# Patient Record
Sex: Female | Born: 1940
Health system: Southern US, Community
[De-identification: ages and names within clinical notes are randomized; demographics above are authoritative.]

## PROBLEM LIST (undated history)

## (undated) DIAGNOSIS — E559 Vitamin D deficiency, unspecified: Secondary | ICD-10-CM

## (undated) DIAGNOSIS — M1990 Unspecified osteoarthritis, unspecified site: Secondary | ICD-10-CM

## (undated) DIAGNOSIS — M4800 Spinal stenosis, site unspecified: Secondary | ICD-10-CM

## (undated) DIAGNOSIS — E785 Hyperlipidemia, unspecified: Secondary | ICD-10-CM

## (undated) DIAGNOSIS — I10 Essential (primary) hypertension: Secondary | ICD-10-CM

## (undated) DIAGNOSIS — R200 Anesthesia of skin: Secondary | ICD-10-CM

## (undated) DIAGNOSIS — H8110 Benign paroxysmal vertigo, unspecified ear: Secondary | ICD-10-CM

## (undated) DIAGNOSIS — I2510 Atherosclerotic heart disease of native coronary artery without angina pectoris: Secondary | ICD-10-CM

## (undated) DIAGNOSIS — R112 Nausea with vomiting, unspecified: Secondary | ICD-10-CM

## (undated) DIAGNOSIS — R0989 Other specified symptoms and signs involving the circulatory and respiratory systems: Secondary | ICD-10-CM

## (undated) DIAGNOSIS — Z9889 Other specified postprocedural states: Secondary | ICD-10-CM

## (undated) DIAGNOSIS — F419 Anxiety disorder, unspecified: Secondary | ICD-10-CM

## (undated) DIAGNOSIS — J189 Pneumonia, unspecified organism: Secondary | ICD-10-CM

## (undated) HISTORY — DX: Unspecified osteoarthritis, unspecified site: M19.90

## (undated) HISTORY — DX: Benign paroxysmal vertigo, unspecified ear: H81.10

## (undated) HISTORY — DX: Anesthesia of skin: R20.0

## (undated) HISTORY — DX: Hyperlipidemia, unspecified: E78.5

## (undated) HISTORY — PX: TONSILLECTOMY: SUR1361

## (undated) HISTORY — PX: WISDOM TOOTH EXTRACTION: SHX21

## (undated) HISTORY — DX: Vitamin D deficiency, unspecified: E55.9

## (undated) HISTORY — PX: CATARACT EXTRACTION, BILATERAL: SHX1313

## (undated) HISTORY — PX: ABDOMINAL HYSTERECTOMY: SHX81

## (undated) HISTORY — DX: Essential (primary) hypertension: I10

## (undated) HISTORY — DX: Other specified symptoms and signs involving the circulatory and respiratory systems: R09.89

## (undated) HISTORY — DX: Spinal stenosis, site unspecified: M48.00

---
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
## 1979-02-02 HISTORY — PX: ABDOMINAL HYSTERECTOMY: SUR658

## 2018-02-01 HISTORY — PX: CATARACT EXTRACTION, BILATERAL: SHX1313

## 2018-06-02 HISTORY — PX: COLONOSCOPY: SHX5424

## 2018-11-02 HISTORY — PX: CATARACT EXTRACTION, BILATERAL: SHX1313

## 2019-06-28 ENCOUNTER — Other Ambulatory Visit: Payer: Self-pay | Admitting: *Deleted

## 2019-06-28 ENCOUNTER — Encounter: Payer: Self-pay | Admitting: *Deleted

## 2019-07-04 ENCOUNTER — Ambulatory Visit: Payer: Medicare PPO | Admitting: Diagnostic Neuroimaging

## 2019-07-04 ENCOUNTER — Other Ambulatory Visit: Payer: Self-pay

## 2019-07-04 ENCOUNTER — Encounter: Payer: Self-pay | Admitting: Diagnostic Neuroimaging

## 2019-07-04 VITALS — BP 124/70 | HR 55 | Ht 65.0 in | Wt 140.0 lb

## 2019-07-04 DIAGNOSIS — M4802 Spinal stenosis, cervical region: Secondary | ICD-10-CM | POA: Diagnosis not present

## 2019-07-04 MED ORDER — ALPRAZOLAM 0.5 MG PO TABS: ORAL_TABLET | ORAL | 0 refills | Status: DC

## 2019-07-04 NOTE — Progress Notes (Signed)
[facility]  PATIENT: Haley Robinson DOB: Oct 08, 1940  REFERRING CLINICIAN: Raelene Bott, MD HISTORY FROM: patient  REASON FOR VISIT: new consult    HISTORICAL  CHIEF COMPLAINT:  Chief Complaint  Patient presents with  . Numbness, tingling of hands, weakness    rm 6 New Pt "numbness, tingling for years mostly in left hand, no pain; I am left handed, can't grasp or make a fist on left; saw Dr Joya Salm years ago- dx with spinal stenosis"    HISTORY OF PRESENT ILLNESS:   79 year old female with history of cervical spinal stenosis and numbness in hands since the year 2000, here for follow-up.  Patient has chronic symptoms of numbness in hands, weakness in hands, previously evaluated by neurology and neurosurgery around the year 2000.  She was diagnosed with cervical spinal stenosis and managed conservatively.  Symptoms have progressed over time.  In last 1 months she has noticed more stiffness, arthritis, weakness in her hand grip.  Also has numbness and tingling in her toes which is chronic.  Symptoms worse in the left and right side.   REVIEW OF SYSTEMS: Full 14 system review of systems performed and negative with exception of: As per HPI.  ALLERGIES: No Known Allergies  HOME MEDICATIONS: Outpatient Medications Prior to Visit  Medication Sig Dispense Refill  . aspirin 81 MG EC tablet Take 81 mg by mouth daily.    . hydrochlorothiazide (HYDRODIURIL) 25 MG tablet TAKE 1 TABLET BY MOUTH EVERY DAY    . Multiple Vitamin (MULTI-VITAMIN) tablet Take by mouth daily.    . simvastatin (ZOCOR) 40 MG tablet TAKE 1 TABLET BY MOUTH DAILY    . meclizine (ANTIVERT) 12.5 MG tablet Take one to two tablets three times daily as needed     No facility-administered medications prior to visit.    PAST MEDICAL HISTORY: Past Medical History:  Diagnosis Date  . CAD (coronary artery disease)   . Carotid bruit   . Hyperlipidemia   . Hypertension   . Numbness and tingling in  both hands   . Osteoarthritis    hands  . Spinal stenosis   . Vertigo, benign positional    x 4 years  . Vitamin D deficiency     PAST SURGICAL HISTORY: Past Surgical History:  Procedure Laterality Date  . ABDOMINAL HYSTERECTOMY  1981  . CATARACT EXTRACTION, BILATERAL  2020    FAMILY HISTORY: Family History  Problem Relation Age of Onset  . Alzheimer's disease Mother   . Lung cancer Father     SOCIAL HISTORY: Social History   Socioeconomic History  . Marital status: Widowed    Spouse name: Not on file  . Number of children: 2  . Years of education: 97  . Highest education level: Not on file  Occupational History    Comment: retired Art therapist, clerical work  Tobacco Use  . Smoking status: Current Every Day Smoker    Packs/day: 0.25  . Smokeless tobacco: Never Used  Substance and Sexual Activity  . Alcohol use: Never  . Drug use: Never  . Sexual activity: Not on file  Other Topics Concern  . Not on file  Social History Narrative   06/28/19 Daughter lives with her   Caffeine- coffee 1 c daily   Social Determinants of Health   Financial Resource Strain:   . Difficulty of Paying Living Expenses:   Food Insecurity:   . Worried About Charity fundraiser in the Last Year:   .  Ran Out of Food in the Last Year:   Transportation Needs:   . Film/video editor (Medical):   Marland Kitchen Lack of Transportation (Non-Medical):   Physical Activity:   . Days of Exercise per Week:   . Minutes of Exercise per Session:   Stress:   . Feeling of Stress :   Social Connections:   . Frequency of Communication with Friends and Family:   . Frequency of Social Gatherings with Friends and Family:   . Attends Religious Services:   . Active Member of Clubs or Organizations:   . Attends Archivist Meetings:   Marland Kitchen Marital Status:   Intimate Partner Violence:   . Fear of Current or Ex-Partner:   . Emotionally Abused:   Marland Kitchen Physically Abused:   . Sexually Abused:       PHYSICAL EXAM  GENERAL EXAM/CONSTITUTIONAL: Vitals:  Vitals:   07/04/19 0844  BP: 124/70  Pulse: (!) 55  Weight: 140 lb (63.5 kg)  Height: _0  (1.651 m)     Body mass index is 23.3 kg/m. Wt Readings from Last 3 Encounters:  07/04/19 140 lb (63.5 kg)     Patient is in no distress; well developed, nourished and groomed; neck is supple  CARDIOVASCULAR:  Examination of carotid arteries is normal; no carotid bruits  Regular rate and rhythm, no murmurs  Examination of peripheral vascular system by observation and palpation is normal  EYES:  Ophthalmoscopic exam of optic discs and posterior segments is normal; no papilledema or hemorrhages  No exam data present  MUSCULOSKELETAL:  Gait, strength, tone, movements noted in Neurologic exam below  NEUROLOGIC: MENTAL STATUS:  No flowsheet data found.  awake, alert, oriented to person, place and time  recent and remote memory intact  normal attention and concentration  language fluent, comprehension intact, naming intact  fund of knowledge appropriate  CRANIAL NERVE:   2nd - no papilledema on fundoscopic exam  2nd, 3rd, 4th, 6th - pupils equal and reactive to light, visual fields full to confrontation, extraocular muscles intact, no nystagmus  5th - facial sensation symmetric  7th - facial strength symmetric  8th - hearing intact  9th - palate elevates symmetrically, uvula midline  11th - shoulder shrug symmetric  12th - tongue protrusion midline  MOTOR:   normal bulk and tone, full strength in the BUE, BLE; EXCEPT ATROPHY AND WEAKNESS OF BILATERAL HAND INTRINSIC MUSCLES AND LEFT HIP FLEXION (LIMITED BY PAIN)  SIGNIFICANT ARTHRITIS IN BILATERAL HANDS  SENSORY:   normal and symmetric to light touch, pinprick, temperature, vibration; EXCEPT DECR IN FINGERS AND TOES  COORDINATION:   finger-nose-finger, fine finger movements normal  REFLEXES:   deep tendon reflexes ---> BRISK IN UPPER  EXT; TRACE IN LEGS  GAIT/STATION:   narrow based gait     DIAGNOSTIC DATA (LABS, IMAGING, TESTING) - I reviewed patient records, labs, notes, testing and imaging myself where available.  No results found for: WBC, HGB, HCT, MCV, PLT No results found for: NA, K, CL, CO2, GLUCOSE, BUN, CREATININE, CALCIUM, PROT, ALBUMIN, AST, ALT, ALKPHOS, BILITOT, GFRNONAA, GFRAA No results found for: CHOL, HDL, LDLCALC, LDLDIRECT, TRIG, CHOLHDL No results found for: HGBA1C No results found for: VITAMINB12 No results found for: TSH    ASSESSMENT AND PLAN  79 y.o. year old female here with:  Dx:  1. Cervical spinal stenosis     PLAN:  HAND / FOOT NUMBNESS - check MRI cervical spine (history cervical spinal stenosis since ~2000) - check neuropathy labs (  since ~2000)  Meds ordered this encounter  Medications  . ALPRAZolam (XANAX) 0.5 MG tablet    Sig: for sedation before MRI scan; take 1 tab 1 hour before scan; may repeat 1 tab 15 min before scan    Dispense:  3 tablet    Refill:  0   Orders Placed This Encounter  Procedures  . MR CERVICAL SPINE WO CONTRAST  . CBC with diff  . CMP  . Vitamin B12  . A1c  . TSH  . SPEP with IFE  . ANA w/Reflex  . SSA, SSB  . ESR  . CRP  . Vitamin B1  . Vitamin B6  . ANCA   Return pending test results, for pending if symptoms worsen or fail to improve.    Penni Bombard, MD 08/06/8830, 5:49 AM Certified in Neurology, Neurophysiology and Neuroimaging  [facility] [address], [place], [place] [address] [phone]

## 2019-07-09 ENCOUNTER — Telehealth: Payer: Self-pay | Admitting: *Deleted

## 2019-07-09 LAB — VITAMIN B12: Vitamin B-12: 149 pg/mL — ABNORMAL LOW (ref 232–1245)

## 2019-07-09 LAB — MULTIPLE MYELOMA PANEL, SERUM
Albumin SerPl Elph-Mcnc: 3.7 g/dL (ref 2.9–4.4)
Albumin/Glob SerPl: 1.7 (ref 0.7–1.7)
Alpha 1: 0.2 g/dL (ref 0.0–0.4)
Alpha2 Glob SerPl Elph-Mcnc: 0.7 g/dL (ref 0.4–1.0)
B-Globulin SerPl Elph-Mcnc: 0.8 g/dL (ref 0.7–1.3)
Gamma Glob SerPl Elph-Mcnc: 0.6 g/dL (ref 0.4–1.8)
Globulin, Total: 2.2 g/dL (ref 2.2–3.9)
IgA/Immunoglobulin A, Serum: 75 mg/dL (ref 64–422)
IgG (Immunoglobin G), Serum: 573 mg/dL — ABNORMAL LOW (ref 586–1602)
IgM (Immunoglobulin M), Srm: 48 mg/dL (ref 26–217)

## 2019-07-09 LAB — COMPREHENSIVE METABOLIC PANEL
ALT: 10 IU/L (ref 0–32)
AST: 13 IU/L (ref 0–40)
Albumin/Globulin Ratio: 2.3 — ABNORMAL HIGH (ref 1.2–2.2)
Albumin: 4.1 g/dL (ref 3.7–4.7)
Alkaline Phosphatase: 71 IU/L (ref 48–121)
BUN/Creatinine Ratio: 11 — ABNORMAL LOW (ref 12–28)
BUN: 9 mg/dL (ref 8–27)
Bilirubin Total: 0.3 mg/dL (ref 0.0–1.2)
CO2: 26 mmol/L (ref 20–29)
Calcium: 9.4 mg/dL (ref 8.7–10.3)
Chloride: 107 mmol/L — ABNORMAL HIGH (ref 96–106)
Creatinine, Ser: 0.82 mg/dL (ref 0.57–1.00)
GFR calc Af Amer: 80 mL/min/{1.73_m2} (ref >59)
GFR calc non Af Amer: 70 mL/min/{1.73_m2} (ref >59)
Globulin, Total: 1.8 g/dL (ref 1.5–4.5)
Glucose: 96 mg/dL (ref 65–99)
Potassium: 4 mmol/L (ref 3.5–5.2)
Sodium: 143 mmol/L (ref 134–144)
Total Protein: 5.9 g/dL — ABNORMAL LOW (ref 6.0–8.5)

## 2019-07-09 LAB — CBC WITH DIFFERENTIAL/PLATELET
Basophils Absolute: 0.1 10*3/uL (ref 0.0–0.2)
Basos: 1 %
EOS (ABSOLUTE): 0.1 10*3/uL (ref 0.0–0.4)
Eos: 2 %
Hematocrit: 39.3 % (ref 34.0–46.6)
Hemoglobin: 12.9 g/dL (ref 11.1–15.9)
Immature Grans (Abs): 0 10*3/uL (ref 0.0–0.1)
Immature Granulocytes: 0 %
Lymphocytes Absolute: 1.9 10*3/uL (ref 0.7–3.1)
Lymphs: 26 %
MCH: 31.2 pg (ref 26.6–33.0)
MCHC: 32.8 g/dL (ref 31.5–35.7)
MCV: 95 fL (ref 79–97)
Monocytes Absolute: 0.5 10*3/uL (ref 0.1–0.9)
Monocytes: 6 %
Neutrophils Absolute: 4.8 10*3/uL (ref 1.4–7.0)
Neutrophils: 65 %
Platelets: 246 10*3/uL (ref 150–450)
RBC: 4.13 x10E6/uL (ref 3.77–5.28)
RDW: 12.8 % (ref 11.7–15.4)
WBC: 7.4 10*3/uL (ref 3.4–10.8)

## 2019-07-09 LAB — PAN-ANCA
ANCA Proteinase 3: <3.5 U/mL (ref 0.0–3.5)
Atypical pANCA: <1:20 {titer}
C-ANCA: <1:20 {titer}
Myeloperoxidase Ab: <9 U/mL (ref 0.0–9.0)
P-ANCA: <1:20 {titer}

## 2019-07-09 LAB — C-REACTIVE PROTEIN: CRP: 2 mg/L (ref 0–10)

## 2019-07-09 LAB — TSH: TSH: 1.56 u[IU]/mL (ref 0.450–4.500)

## 2019-07-09 LAB — HEMOGLOBIN A1C
Est. average glucose Bld gHb Est-mCnc: 120 mg/dL
Hgb A1c MFr Bld: 5.8 % — ABNORMAL HIGH (ref 4.8–5.6)

## 2019-07-09 LAB — SJOGREN'S SYNDROME ANTIBODS(SSA + SSB)
ENA SSA (RO) Ab: <0.2 AI (ref 0.0–0.9)
ENA SSB (LA) Ab: <0.2 AI (ref 0.0–0.9)

## 2019-07-09 LAB — VITAMIN B1: Thiamine: 136.4 nmol/L (ref 66.5–200.0)

## 2019-07-09 LAB — ANA W/REFLEX: ANA Titer 1: NEGATIVE

## 2019-07-09 LAB — SEDIMENTATION RATE: Sed Rate: 5 mm/hr (ref 0–40)

## 2019-07-09 LAB — VITAMIN B6: Vitamin B6: 6.7 ug/L (ref 2.0–32.8)

## 2019-07-09 NOTE — Telephone Encounter (Signed)
LVM requesting call back for results. 

## 2019-07-09 NOTE — Telephone Encounter (Addendum)
Called patient and informed her labs showed low B12 levels; other labs pending. Dr Marjory Lies recommends a B12 replacement (1000 mcg oral daily) and follow up with PCP for possible B12 injection. Advised she'll get a call when other labs are completed. Patient verbalized understanding, appreciation.

## 2019-07-09 NOTE — Telephone Encounter (Signed)
Pt returned call. Please call back when available. 

## 2019-07-11 ENCOUNTER — Telehealth: Payer: Self-pay | Admitting: Diagnostic Neuroimaging

## 2019-07-11 NOTE — Telephone Encounter (Signed)
Francine Graven Berkley Harvey: [id number] (exp. 07/11/19 to 08/10/19) order sent to GI. They will reach out to the patient to schedule.

## 2019-07-16 ENCOUNTER — Ambulatory Visit
Admission: RE | Admit: 2019-07-16 | Discharge: 2019-07-16 | Disposition: A | Discharge status: Home / Self Care | Payer: Medicare PPO | Source: Ambulatory Visit | Attending: Diagnostic Neuroimaging | Admitting: Diagnostic Neuroimaging

## 2019-07-16 DIAGNOSIS — M4802 Spinal stenosis, cervical region: Secondary | ICD-10-CM

## 2019-07-17 NOTE — Addendum Note (Signed)
Addended by: Joycelyn Schmid R on: 07/17/2019 09:04 PM   Modules accepted: Orders

## 2019-07-18 ENCOUNTER — Telehealth: Payer: Self-pay | Admitting: *Deleted

## 2019-07-18 DIAGNOSIS — M4802 Spinal stenosis, cervical region: Secondary | ICD-10-CM

## 2019-07-18 NOTE — Telephone Encounter (Signed)
Spoke with patient and informed her that her MRI cervical spine confirmed spinal stenosis. Dr Marjory Lies recommends a neurosurgery consult. She has seen Dr Caryl Ada in past, requested to see him a again. I advised a referral will be placed; she can call Dr Ethelene Browns office in a week or so if she hasn't heard from them. Patient verbalized understanding, appreciation.

## 2019-07-30 NOTE — Telephone Encounter (Signed)
Called patient and informed her the remaining lab results are okay. Patient verbalized understanding, appreciation .

## 2019-07-30 NOTE — Telephone Encounter (Signed)
Labs other wise ok. _VRP

## 2019-08-23 ENCOUNTER — Other Ambulatory Visit: Payer: Self-pay | Admitting: Neurosurgery

## 2019-08-24 NOTE — Progress Notes (Signed)
Patient called in asking if she would need to come in for a pre-op appointment prior to her surgery on Tuesday. Instructed patient that she would receive a pre-op phone call with her instructions on Monday, 08/27/19. Advised patient that she would need to go for a COVID test. Scheduled COVID testing appointment for 08/25/19 per patient request.

## 2019-08-25 ENCOUNTER — Other Ambulatory Visit (HOSPITAL_COMMUNITY)
Admission: RE | Admit: 2019-08-25 | Discharge: 2019-08-25 | Disposition: A | Discharge status: Home / Self Care | Payer: Medicare PPO | Source: Ambulatory Visit | Attending: Neurosurgery | Admitting: Neurosurgery

## 2019-08-25 DIAGNOSIS — Z20822 Contact with and (suspected) exposure to covid-19: Secondary | ICD-10-CM | POA: Insufficient documentation

## 2019-08-25 DIAGNOSIS — Z01812 Encounter for preprocedural laboratory examination: Secondary | ICD-10-CM | POA: Insufficient documentation

## 2019-08-25 LAB — SARS CORONAVIRUS 2 (TAT 6-24 HRS): SARS Coronavirus 2: NEGATIVE

## 2019-08-27 ENCOUNTER — Encounter (HOSPITAL_COMMUNITY): Payer: Self-pay | Admitting: Neurosurgery

## 2019-08-27 NOTE — Progress Notes (Signed)
Anesthesia Chart Review: Same day workup  Patient has chronic symptoms of numbness in hands, weakness in hands, previously evaluated by neurology and neurosurgery around the year 2000.  She was diagnosed with cervical spinal stenosis and managed conservatively. Symptoms have progressed over time. Cervical MRI 07/16/19 showed moderate-severe spinal stenosis and severe biforaminal stenosis with myelomalacia on axial views at C5-6.  Last seen by PCP 08/24/19. Surgery discussed. Per note, "She is scheduled for cervical disc surgery next week due to her cervical disc disease with myelopathy. She is medically optimal for the surgery at this time."  Will need DOS labs, ekg, and eval.   Carotid duplex 06/01/19 (care everywhere): Right:   Color duplex indicates moderate heterogeneous and calcified plaque,  with no hemodynamically significant stenosis by duplex criteria in  the extracranial cerebrovascular circulation.   Left:   Heterogeneous and partially calcified plaque at the left carotid  bifurcation, with discordant results regarding degree of stenosis by  established duplex criteria. Peak velocity suggests 50%-69%  stenosis, with the ICA/ CCA ratio suggesting a lesser degree of  stenosis. If establishing a more accuratedegree of stenosis is  required, cerebral angiogram should be considered, or as a second  best test, CTA.   Parvus tardus waveform of the right vertebral artery suggests a  proximal stenosis.     Zannie Cove [facility] Short Stay [facility]/Anesthesiology Phone [phone] 08/27/2019 3:59 PM

## 2019-08-27 NOTE — Progress Notes (Signed)
[facility] Medical Records called.  No EKG found.  Need EKG on DOS.

## 2019-08-27 NOTE — Anesthesia Preprocedure Evaluation (Addendum)
Anesthesia Evaluation  Patient identified by MRN, date of birth, ID band Patient awake    Reviewed: Allergy & Precautions, NPO status , Patient's Chart, lab work & pertinent test results  History of Anesthesia Complications (+) PONV and history of anesthetic complications  Airway Mallampati: II  TM Distance: >3 FB Neck ROM: Limited    Dental  (+) Teeth Intact, Dental Advisory Given, Chipped,    Pulmonary Current Smoker and Patient abstained from smoking.,    Pulmonary exam normal breath sounds clear to auscultation       Cardiovascular hypertension, Pt. on medications Normal cardiovascular exam Rhythm:Regular Rate:Normal     Neuro/Psych PSYCHIATRIC DISORDERS Anxiety Cervical Stenosis - myelopathy  Neuromuscular disease    GI/Hepatic negative GI ROS, Neg liver ROS,   Endo/Other  negative endocrine ROS  Renal/GU negative Renal ROS     Musculoskeletal  (+) Arthritis ,   Abdominal   Peds  Hematology negative hematology ROS (+)   Anesthesia Other Findings   Reproductive/Obstetrics                          Anesthesia Physical Anesthesia Plan  ASA: III  Anesthesia Plan: General   Post-op Pain Management:    Induction: Intravenous  PONV Risk Score and Plan: 3 and Dexamethasone, Ondansetron and Treatment may vary due to age or medical condition  Airway Management Planned: Oral ETT and Video Laryngoscope Planned  Additional Equipment:   Intra-op Plan:   Post-operative Plan: Extubation in OR  Informed Consent: I have reviewed the patients History and Physical, chart, labs and discussed the procedure including the risks, benefits and alternatives for the proposed anesthesia with the patient or authorized representative who has indicated his/her understanding and acceptance.     Dental advisory given  Plan Discussed with: CRNA  Anesthesia Plan Comments: (2nd PIV after  induction.  PAT note by Antionette Poles, PA-C: Patient has chronic symptoms of numbness in hands, weakness in hands, previously evaluated by neurology and neurosurgery around the year 2000.  She was diagnosed with cervical spinal stenosis and managed conservatively. Symptoms have progressed over time. Cervical MRI 07/16/19 showed moderate-severe spinal stenosis and severe biforaminal stenosis with myelomalacia on axial views at C5-6.  Last seen by PCP 08/24/19. Surgery discussed. Per note, "She is scheduled for cervical disc surgery next week due to her cervical disc disease with myelopathy. She is medically optimal for the surgery at this time."  Will need DOS labs, ekg, and eval.   Carotid duplex 06/01/19 (care everywhere): Right:   Color duplex indicates moderate heterogeneous and calcified plaque,  with no hemodynamically significant stenosis by duplex criteria in  the extracranial cerebrovascular circulation.   Left:   Heterogeneous and partially calcified plaque at the left carotid  bifurcation, with discordant results regarding degree of stenosis by  established duplex criteria. Peak velocity suggests 50%-69%  stenosis, with the ICA/ CCA ratio suggesting a lesser degree of  stenosis. If establishing a more accuratedegree of stenosis is  required, cerebral angiogram should be considered, or as a second  best test, CTA.   Parvus tardus waveform of the right vertebral artery suggests a  proximal stenosis.    )     Anesthesia Quick Evaluation

## 2019-08-27 NOTE — Progress Notes (Signed)
Patient denies shortness of breath, fever, cough or chest pain.  PCP - Henreitta Leber, FNP Cardiologist - n/a  Chest x-ray - n/a EKG - 11/16/18 CE, fax sent requesting tracing Stress Test - n/a ECHO - n/a Cardiac Cath - n/a  Aspirin Instructions:   Last dose on Thursday, 08/23/19 per patient.  Anesthesia review: Yes  STOP now taking any Aspirin (unless otherwise instructed by your surgeon), Aleve, Naproxen, Ibuprofen, Motrin, Advil, Goody's, BC's, all herbal medications, fish oil, and all vitamins.   Coronavirus Screening Covid test on 08/25/19 was negative.  Patient verbalized understanding of instructions that were given via phone.

## 2019-08-28 ENCOUNTER — Inpatient Hospital Stay (HOSPITAL_COMMUNITY)
Admission: RE | Admit: 2019-08-28 | Discharge: 2019-09-03 | DRG: 472 | Disposition: A | Discharge status: Rehabilitation Facility | Payer: Medicare PPO | Attending: Neurosurgery | Admitting: Neurosurgery

## 2019-08-28 ENCOUNTER — Inpatient Hospital Stay (HOSPITAL_COMMUNITY): Payer: Medicare PPO

## 2019-08-28 ENCOUNTER — Encounter (HOSPITAL_COMMUNITY)
Admission: RE | Disposition: A | Discharge status: Rehabilitation Facility | Payer: Self-pay | Source: Home / Self Care | Attending: Neurosurgery

## 2019-08-28 ENCOUNTER — Inpatient Hospital Stay (HOSPITAL_COMMUNITY): Payer: Medicare PPO | Admitting: Physician Assistant

## 2019-08-28 ENCOUNTER — Encounter (HOSPITAL_COMMUNITY): Payer: Self-pay | Admitting: Neurosurgery

## 2019-08-28 ENCOUNTER — Other Ambulatory Visit: Payer: Self-pay

## 2019-08-28 DIAGNOSIS — E785 Hyperlipidemia, unspecified: Secondary | ICD-10-CM | POA: Diagnosis present

## 2019-08-28 DIAGNOSIS — Z818 Family history of other mental and behavioral disorders: Secondary | ICD-10-CM

## 2019-08-28 DIAGNOSIS — Z20822 Contact with and (suspected) exposure to covid-19: Secondary | ICD-10-CM | POA: Diagnosis present

## 2019-08-28 DIAGNOSIS — Z79899 Other long term (current) drug therapy: Secondary | ICD-10-CM

## 2019-08-28 DIAGNOSIS — M4802 Spinal stenosis, cervical region: Principal | ICD-10-CM | POA: Diagnosis present

## 2019-08-28 DIAGNOSIS — M4712 Other spondylosis with myelopathy, cervical region: Secondary | ICD-10-CM | POA: Diagnosis present

## 2019-08-28 DIAGNOSIS — I10 Essential (primary) hypertension: Secondary | ICD-10-CM | POA: Diagnosis present

## 2019-08-28 DIAGNOSIS — Z7982 Long term (current) use of aspirin: Secondary | ICD-10-CM | POA: Diagnosis not present

## 2019-08-28 DIAGNOSIS — Z419 Encounter for procedure for purposes other than remedying health state, unspecified: Secondary | ICD-10-CM

## 2019-08-28 DIAGNOSIS — K592 Neurogenic bowel, not elsewhere classified: Secondary | ICD-10-CM | POA: Diagnosis not present

## 2019-08-28 DIAGNOSIS — M7989 Other specified soft tissue disorders: Secondary | ICD-10-CM | POA: Diagnosis not present

## 2019-08-28 DIAGNOSIS — F419 Anxiety disorder, unspecified: Secondary | ICD-10-CM | POA: Diagnosis present

## 2019-08-28 DIAGNOSIS — Z23 Encounter for immunization: Secondary | ICD-10-CM | POA: Diagnosis present

## 2019-08-28 DIAGNOSIS — M2578 Osteophyte, vertebrae: Secondary | ICD-10-CM | POA: Diagnosis present

## 2019-08-28 DIAGNOSIS — E559 Vitamin D deficiency, unspecified: Secondary | ICD-10-CM | POA: Diagnosis present

## 2019-08-28 DIAGNOSIS — F17210 Nicotine dependence, cigarettes, uncomplicated: Secondary | ICD-10-CM | POA: Diagnosis present

## 2019-08-28 DIAGNOSIS — N319 Neuromuscular dysfunction of bladder, unspecified: Secondary | ICD-10-CM | POA: Diagnosis not present

## 2019-08-28 DIAGNOSIS — Z4789 Encounter for other orthopedic aftercare: Secondary | ICD-10-CM | POA: Diagnosis not present

## 2019-08-28 DIAGNOSIS — G959 Disease of spinal cord, unspecified: Secondary | ICD-10-CM | POA: Diagnosis not present

## 2019-08-28 HISTORY — DX: Pneumonia, unspecified organism: J18.9

## 2019-08-28 HISTORY — PX: ANTERIOR CERVICAL DECOMP/DISCECTOMY FUSION: SHX1161

## 2019-08-28 HISTORY — DX: Other specified postprocedural states: Z98.890

## 2019-08-28 HISTORY — DX: Anxiety disorder, unspecified: F41.9

## 2019-08-28 HISTORY — DX: Nausea with vomiting, unspecified: R11.2

## 2019-08-28 LAB — BASIC METABOLIC PANEL
Anion gap: 11 (ref 5–15)
BUN: 10 mg/dL (ref 8–23)
CO2: 26 mmol/L (ref 22–32)
Calcium: 9.1 mg/dL (ref 8.9–10.3)
Chloride: 106 mmol/L (ref 98–111)
Creatinine, Ser: 0.97 mg/dL (ref 0.44–1.00)
GFR calc Af Amer: >60 mL/min (ref >60)
GFR calc non Af Amer: 57 mL/min — ABNORMAL LOW (ref >60)
Glucose, Bld: 117 mg/dL — ABNORMAL HIGH (ref 70–99)
Potassium: 3.6 mmol/L (ref 3.5–5.1)
Sodium: 143 mmol/L (ref 135–145)

## 2019-08-28 LAB — CBC WITH DIFFERENTIAL/PLATELET
Abs Immature Granulocytes: 0.03 10*3/uL (ref 0.00–0.07)
Basophils Absolute: 0.1 10*3/uL (ref 0.0–0.1)
Basophils Relative: 1 %
Eosinophils Absolute: 0.1 10*3/uL (ref 0.0–0.5)
Eosinophils Relative: 2 %
HCT: 41 % (ref 36.0–46.0)
Hemoglobin: 13 g/dL (ref 12.0–15.0)
Immature Granulocytes: 0 %
Lymphocytes Relative: 25 %
Lymphs Abs: 1.8 10*3/uL (ref 0.7–4.0)
MCH: 31.3 pg (ref 26.0–34.0)
MCHC: 31.7 g/dL (ref 30.0–36.0)
MCV: 98.6 fL (ref 80.0–100.0)
Monocytes Absolute: 0.5 10*3/uL (ref 0.1–1.0)
Monocytes Relative: 7 %
Neutro Abs: 4.9 10*3/uL (ref 1.7–7.7)
Neutrophils Relative %: 65 %
Platelets: 252 10*3/uL (ref 150–400)
RBC: 4.16 MIL/uL (ref 3.87–5.11)
RDW: 13.2 % (ref 11.5–15.5)
WBC: 7.4 10*3/uL (ref 4.0–10.5)
nRBC: 0 % (ref 0.0–0.2)

## 2019-08-28 LAB — SURGICAL PCR SCREEN
MRSA, PCR: NEGATIVE
Staphylococcus aureus: NEGATIVE

## 2019-08-28 LAB — TYPE AND SCREEN
ABO/RH(D): A POS
Antibody Screen: NEGATIVE

## 2019-08-28 LAB — ABO/RH: ABO/RH(D): A POS

## 2019-08-28 SURGERY — ANTERIOR CERVICAL DECOMPRESSION/DISCECTOMY FUSION 3 LEVELS
Anesthesia: General | Site: Spine Cervical

## 2019-08-28 MED ORDER — ACETAMINOPHEN 650 MG RE SUPP: 650.0000 mg | RECTAL | Status: DC | PRN

## 2019-08-28 MED ORDER — SODIUM CHLORIDE 0.9% FLUSH: 3.0000 mL | INTRAVENOUS | Status: DC | PRN

## 2019-08-28 MED ORDER — DEXAMETHASONE SODIUM PHOSPHATE 10 MG/ML IJ SOLN
10.0000 mg | Freq: Four times a day (QID) | INTRAMUSCULAR | Status: DC
  Administered 2019-08-28 – 2019-08-30 (×6): 10 mg via INTRAVENOUS
  Filled 2019-08-28 (×8): qty 1

## 2019-08-28 MED ORDER — LIDOCAINE 2% (20 MG/ML) 5 ML SYRINGE
INTRAMUSCULAR | Status: AC
  Filled 2019-08-28: qty 5

## 2019-08-28 MED ORDER — HYDROCHLOROTHIAZIDE 25 MG PO TABS
12.5000 mg | ORAL_TABLET | Freq: Every day | ORAL | Status: DC
  Administered 2019-08-30 – 2019-09-03 (×5): 12.5 mg via ORAL
  Filled 2019-08-28 (×7): qty 1

## 2019-08-28 MED ORDER — MENTHOL 3 MG MT LOZG: 1.0000 | LOZENGE | OROMUCOSAL | Status: DC | PRN

## 2019-08-28 MED ORDER — SIMVASTATIN 20 MG PO TABS
40.0000 mg | ORAL_TABLET | Freq: Every day | ORAL | Status: DC
  Administered 2019-08-29 – 2019-09-02 (×5): 40 mg via ORAL
  Filled 2019-08-28 (×5): qty 2

## 2019-08-28 MED ORDER — CHLORHEXIDINE GLUCONATE 0.12 % MT SOLN
OROMUCOSAL | Status: AC
  Administered 2019-08-28: 15 mL via OROMUCOSAL
  Filled 2019-08-28: qty 15

## 2019-08-28 MED ORDER — ONDANSETRON HCL 4 MG PO TABS: 4.0000 mg | ORAL_TABLET | Freq: Four times a day (QID) | ORAL | Status: DC | PRN

## 2019-08-28 MED ORDER — MIDAZOLAM HCL 2 MG/2ML IJ SOLN
INTRAMUSCULAR | Status: AC
  Filled 2019-08-28: qty 2

## 2019-08-28 MED ORDER — SODIUM CHLORIDE 0.9 % IV SOLN
250.0000 mL | INTRAVENOUS | Status: DC
  Administered 2019-08-28: 250 mL via INTRAVENOUS

## 2019-08-28 MED ORDER — ONDANSETRON HCL 4 MG/2ML IJ SOLN
INTRAMUSCULAR | Status: AC
  Filled 2019-08-28: qty 2

## 2019-08-28 MED ORDER — AMISULPRIDE (ANTIEMETIC) 5 MG/2ML IV SOLN: 10.0000 mg | Freq: Once | INTRAVENOUS | Status: DC

## 2019-08-28 MED ORDER — HYDROCODONE-ACETAMINOPHEN 5-325 MG PO TABS
1.0000 | ORAL_TABLET | ORAL | Status: DC | PRN
  Administered 2019-09-02: 1 via ORAL
  Filled 2019-08-28 (×2): qty 1

## 2019-08-28 MED ORDER — ONDANSETRON HCL 4 MG/2ML IJ SOLN
4.0000 mg | Freq: Once | INTRAMUSCULAR | Status: AC | PRN
  Administered 2019-08-28: 4 mg via INTRAVENOUS

## 2019-08-28 MED ORDER — CHLORHEXIDINE GLUCONATE CLOTH 2 % EX PADS: 6.0000 | MEDICATED_PAD | Freq: Once | CUTANEOUS | Status: DC

## 2019-08-28 MED ORDER — EPHEDRINE SULFATE-NACL 50-0.9 MG/10ML-% IV SOSY
PREFILLED_SYRINGE | INTRAVENOUS | Status: DC | PRN
  Administered 2019-08-28 (×2): 5 mg via INTRAVENOUS
  Administered 2019-08-28: 10 mg via INTRAVENOUS

## 2019-08-28 MED ORDER — ONDANSETRON HCL 4 MG/2ML IJ SOLN
4.0000 mg | Freq: Four times a day (QID) | INTRAMUSCULAR | Status: DC | PRN
  Administered 2019-08-28: 4 mg via INTRAVENOUS
  Filled 2019-08-28: qty 2

## 2019-08-28 MED ORDER — ACETAMINOPHEN 500 MG PO TABS
1000.0000 mg | ORAL_TABLET | Freq: Once | ORAL | Status: AC
  Administered 2019-08-28: 1000 mg via ORAL
  Filled 2019-08-28: qty 2

## 2019-08-28 MED ORDER — PROPOFOL 10 MG/ML IV BOLUS
INTRAVENOUS | Status: AC
  Filled 2019-08-28: qty 20

## 2019-08-28 MED ORDER — DEXAMETHASONE SODIUM PHOSPHATE 10 MG/ML IJ SOLN
INTRAMUSCULAR | Status: AC
  Filled 2019-08-28: qty 1

## 2019-08-28 MED ORDER — CEFAZOLIN SODIUM-DEXTROSE 1-4 GM/50ML-% IV SOLN
1.0000 g | Freq: Three times a day (TID) | INTRAVENOUS | Status: AC
  Administered 2019-08-28 – 2019-08-29 (×2): 1 g via INTRAVENOUS
  Filled 2019-08-28 (×2): qty 50

## 2019-08-28 MED ORDER — 0.9 % SODIUM CHLORIDE (POUR BTL) OPTIME
TOPICAL | Status: DC | PRN
  Administered 2019-08-28: 1000 mL

## 2019-08-28 MED ORDER — LIDOCAINE 2% (20 MG/ML) 5 ML SYRINGE
INTRAMUSCULAR | Status: DC | PRN
  Administered 2019-08-28: 60 mg via INTRAVENOUS

## 2019-08-28 MED ORDER — ACETAMINOPHEN 325 MG PO TABS
650.0000 mg | ORAL_TABLET | ORAL | Status: DC | PRN
  Administered 2019-08-28 – 2019-08-31 (×9): 650 mg via ORAL
  Filled 2019-08-28 (×8): qty 2

## 2019-08-28 MED ORDER — THROMBIN 20000 UNITS EX SOLR
CUTANEOUS | Status: DC | PRN
  Administered 2019-08-28: 20 mL via TOPICAL

## 2019-08-28 MED ORDER — ACETAMINOPHEN 325 MG PO TABS
ORAL_TABLET | ORAL | Status: AC
  Filled 2019-08-28: qty 2

## 2019-08-28 MED ORDER — DEXAMETHASONE SODIUM PHOSPHATE 10 MG/ML IJ SOLN
10.0000 mg | Freq: Once | INTRAMUSCULAR | Status: AC
  Administered 2019-08-28: 10 mg via INTRAVENOUS

## 2019-08-28 MED ORDER — CYCLOBENZAPRINE HCL 10 MG PO TABS
10.0000 mg | ORAL_TABLET | Freq: Three times a day (TID) | ORAL | Status: DC | PRN
  Administered 2019-08-30 (×2): 10 mg via ORAL
  Filled 2019-08-28 (×2): qty 1

## 2019-08-28 MED ORDER — ORAL CARE MOUTH RINSE: 15.0000 mL | Freq: Once | OROMUCOSAL | Status: AC

## 2019-08-28 MED ORDER — DEXAMETHASONE SODIUM PHOSPHATE 10 MG/ML IJ SOLN: INTRAMUSCULAR | Status: DC | PRN

## 2019-08-28 MED ORDER — ONDANSETRON HCL 4 MG/2ML IJ SOLN
INTRAMUSCULAR | Status: DC | PRN
  Administered 2019-08-28: 4 mg via INTRAVENOUS

## 2019-08-28 MED ORDER — THROMBIN 5000 UNITS EX SOLR
CUTANEOUS | Status: AC
  Filled 2019-08-28: qty 5000

## 2019-08-28 MED ORDER — THROMBIN 20000 UNITS EX SOLR
CUTANEOUS | Status: AC
  Filled 2019-08-28: qty 20000

## 2019-08-28 MED ORDER — LACTATED RINGERS IV SOLN: INTRAVENOUS | Status: DC

## 2019-08-28 MED ORDER — ROCURONIUM BROMIDE 10 MG/ML (PF) SYRINGE
PREFILLED_SYRINGE | INTRAVENOUS | Status: AC
  Filled 2019-08-28: qty 10

## 2019-08-28 MED ORDER — HYDROMORPHONE HCL 1 MG/ML IJ SOLN: 1.0000 mg | INTRAMUSCULAR | Status: DC | PRN

## 2019-08-28 MED ORDER — ROCURONIUM BROMIDE 10 MG/ML (PF) SYRINGE
PREFILLED_SYRINGE | INTRAVENOUS | Status: DC | PRN
  Administered 2019-08-28 (×2): 50 mg via INTRAVENOUS

## 2019-08-28 MED ORDER — THROMBIN 5000 UNITS EX SOLR
OROMUCOSAL | Status: DC | PRN
  Administered 2019-08-28: 5 mL via TOPICAL

## 2019-08-28 MED ORDER — CEFAZOLIN SODIUM-DEXTROSE 2-4 GM/100ML-% IV SOLN
2.0000 g | INTRAVENOUS | Status: AC
  Administered 2019-08-28: 2 g via INTRAVENOUS
  Filled 2019-08-28: qty 100

## 2019-08-28 MED ORDER — PROPOFOL 10 MG/ML IV BOLUS
INTRAVENOUS | Status: DC | PRN
  Administered 2019-08-28: 100 mg via INTRAVENOUS

## 2019-08-28 MED ORDER — FENTANYL CITRATE (PF) 250 MCG/5ML IJ SOLN
INTRAMUSCULAR | Status: DC | PRN
  Administered 2019-08-28 (×4): 50 ug via INTRAVENOUS
  Administered 2019-08-28: 100 ug via INTRAVENOUS

## 2019-08-28 MED ORDER — SUGAMMADEX SODIUM 200 MG/2ML IV SOLN
INTRAVENOUS | Status: DC | PRN
  Administered 2019-08-28: 200 mg via INTRAVENOUS

## 2019-08-28 MED ORDER — EPHEDRINE 5 MG/ML INJ
INTRAVENOUS | Status: AC
  Filled 2019-08-28: qty 10

## 2019-08-28 MED ORDER — PHENOL 1.4 % MT LIQD: 1.0000 | OROMUCOSAL | Status: DC | PRN

## 2019-08-28 MED ORDER — FENTANYL CITRATE (PF) 100 MCG/2ML IJ SOLN: 25.0000 ug | INTRAMUSCULAR | Status: DC | PRN

## 2019-08-28 MED ORDER — FENTANYL CITRATE (PF) 250 MCG/5ML IJ SOLN
INTRAMUSCULAR | Status: AC
  Filled 2019-08-28: qty 5

## 2019-08-28 MED ORDER — SODIUM CHLORIDE 0.9% FLUSH
3.0000 mL | Freq: Two times a day (BID) | INTRAVENOUS | Status: DC
  Administered 2019-08-28 – 2019-09-03 (×10): 3 mL via INTRAVENOUS

## 2019-08-28 MED ORDER — HYDROCODONE-ACETAMINOPHEN 10-325 MG PO TABS
2.0000 | ORAL_TABLET | ORAL | Status: DC | PRN
  Administered 2019-08-31 – 2019-09-02 (×3): 2 via ORAL
  Filled 2019-08-28 (×3): qty 2

## 2019-08-28 MED ORDER — CHLORHEXIDINE GLUCONATE 0.12 % MT SOLN: 15.0000 mL | Freq: Once | OROMUCOSAL | Status: AC

## 2019-08-28 MED ORDER — SODIUM CHLORIDE 0.9 % IV SOLN
INTRAVENOUS | Status: DC | PRN
  Administered 2019-08-28: 500 mL

## 2019-08-28 SURGICAL SUPPLY — 71 items
APL SKNCLS STERI-STRIP NONHPOA (GAUZE/BANDAGES/DRESSINGS) ×1
BAG DECANTER FOR FLEXI CONT (MISCELLANEOUS) ×3 IMPLANT
BAND INSRT 18 STRL LF DISP RB (MISCELLANEOUS) ×2
BAND RUBBER #18 3X1/16 STRL (MISCELLANEOUS) ×6 IMPLANT
BENZOIN TINCTURE PRP APPL 2/3 (GAUZE/BANDAGES/DRESSINGS) ×3 IMPLANT
BIT DRILL 13 (BIT) ×2 IMPLANT
BIT DRILL 13MM (BIT) ×1
BUR MATCHSTICK NEURO 3.0 LAGG (BURR) ×6 IMPLANT
CAGE PEEK 6X14X11 (Cage) ×3 IMPLANT
CAGE PEEK 7X14X11 (Cage) ×6 IMPLANT
CANISTER SUCT 3000ML PPV (MISCELLANEOUS) ×3 IMPLANT
CARTRIDGE OIL MAESTRO DRILL (MISCELLANEOUS) ×1 IMPLANT
CLOSURE STERI-STRIP 1/2X4 (GAUZE/BANDAGES/DRESSINGS) ×1
CLOSURE WOUND 1/2 X4 (GAUZE/BANDAGES/DRESSINGS) ×1
CLSR STERI-STRIP ANTIMIC 1/2X4 (GAUZE/BANDAGES/DRESSINGS) ×2 IMPLANT
COLLAR CERV LO CONTOUR FIRM DE (SOFTGOODS) ×3 IMPLANT
COVER MAYO STAND STRL (DRAPES) ×3 IMPLANT
COVER WAND RF STERILE (DRAPES) ×3 IMPLANT
DIFFUSER DRILL AIR PNEUMATIC (MISCELLANEOUS) ×3 IMPLANT
DRAPE C-ARM 42X72 X-RAY (DRAPES) ×6 IMPLANT
DRAPE LAPAROTOMY 100X72 PEDS (DRAPES) ×3 IMPLANT
DRAPE MICROSCOPE LEICA (MISCELLANEOUS) ×3 IMPLANT
DURAPREP 6ML APPLICATOR 50/CS (WOUND CARE) ×3 IMPLANT
ELECT COATED BLADE 2.86 ST (ELECTRODE) ×6 IMPLANT
ELECT REM PT RETURN 9FT ADLT (ELECTROSURGICAL) ×3
ELECTRODE REM PT RTRN 9FT ADLT (ELECTROSURGICAL) ×1 IMPLANT
GAUZE 4X4 16PLY RFD (DISPOSABLE) IMPLANT
GAUZE SPONGE 4X4 12PLY STRL (GAUZE/BANDAGES/DRESSINGS) ×3 IMPLANT
GAUZE SPONGE 4X4 12PLY STRL LF (GAUZE/BANDAGES/DRESSINGS) ×3 IMPLANT
GLOVE BIO SURGEON STRL SZ 6.5 (GLOVE) ×2 IMPLANT
GLOVE BIO SURGEONS STRL SZ 6.5 (GLOVE) ×1
GLOVE BIOGEL PI IND STRL 6.5 (GLOVE) ×3 IMPLANT
GLOVE BIOGEL PI IND STRL 7.0 (GLOVE) ×2 IMPLANT
GLOVE BIOGEL PI INDICATOR 6.5 (GLOVE) ×6
GLOVE BIOGEL PI INDICATOR 7.0 (GLOVE) ×4
GLOVE ECLIPSE 9.0 STRL (GLOVE) ×6 IMPLANT
GLOVE EXAM NITRILE XL STR (GLOVE) IMPLANT
GLOVE SURG SS PI 6.0 STRL IVOR (GLOVE) ×9 IMPLANT
GOWN STRL REUS W/ TWL LRG LVL3 (GOWN DISPOSABLE) ×3 IMPLANT
GOWN STRL REUS W/ TWL XL LVL3 (GOWN DISPOSABLE) ×2 IMPLANT
GOWN STRL REUS W/TWL 2XL LVL3 (GOWN DISPOSABLE) IMPLANT
GOWN STRL REUS W/TWL LRG LVL3 (GOWN DISPOSABLE) ×9
GOWN STRL REUS W/TWL XL LVL3 (GOWN DISPOSABLE) ×6
HALTER HD/CHIN CERV TRACTION D (MISCELLANEOUS) ×3 IMPLANT
HEMOSTAT POWDER KIT SURGIFOAM (HEMOSTASIS) ×3 IMPLANT
KIT BASIN OR (CUSTOM PROCEDURE TRAY) ×3 IMPLANT
KIT TURNOVER KIT B (KITS) ×3 IMPLANT
NEEDLE SPNL 20GX3.5 QUINCKE YW (NEEDLE) ×3 IMPLANT
NS IRRIG 1000ML POUR BTL (IV SOLUTION) ×3 IMPLANT
OIL CARTRIDGE MAESTRO DRILL (MISCELLANEOUS) ×3
PACK LAMINECTOMY NEURO (CUSTOM PROCEDURE TRAY) ×3 IMPLANT
PAD ARMBOARD 7.5X6 YLW CONV (MISCELLANEOUS) ×9 IMPLANT
PENCIL BUTTON HOLSTER BLD 10FT (ELECTRODE) ×3 IMPLANT
PLATE 3 57.5XLCK NS SPNE CVD (Plate) ×1 IMPLANT
PLATE 3 ATLANTIS TRANS (Plate) ×3 IMPLANT
SCREW ST FIX 4 ATL 3120213 (Screw) ×24 IMPLANT
SPACER SPNL 11X14X6XPEEK CVD (Cage) ×1 IMPLANT
SPACER SPNL 11X14X7XPEEK CVD (Cage) ×2 IMPLANT
SPCR SPNL 11X14X6XPEEK CVD (Cage) ×1 IMPLANT
SPCR SPNL 11X14X7XPEEK CVD (Cage) ×2 IMPLANT
SPONGE INTESTINAL PEANUT (DISPOSABLE) ×3 IMPLANT
SPONGE SURGIFOAM ABS GEL 100 (HEMOSTASIS) ×3 IMPLANT
STRIP CLOSURE SKIN 1/2X4 (GAUZE/BANDAGES/DRESSINGS) ×2 IMPLANT
SUT VIC AB 3-0 SH 8-18 (SUTURE) ×3 IMPLANT
SUT VIC AB 4-0 RB1 18 (SUTURE) ×3 IMPLANT
TAPE CLOTH 4X10 WHT NS (GAUZE/BANDAGES/DRESSINGS) ×3 IMPLANT
TAPE CLOTH SURG 4X10 WHT LF (GAUZE/BANDAGES/DRESSINGS) ×3 IMPLANT
TOWEL GREEN STERILE (TOWEL DISPOSABLE) ×3 IMPLANT
TOWEL GREEN STERILE FF (TOWEL DISPOSABLE) ×3 IMPLANT
TRAP SPECIMEN MUCUS 40CC (MISCELLANEOUS) ×3 IMPLANT
WATER STERILE IRR 1000ML POUR (IV SOLUTION) ×3 IMPLANT

## 2019-08-28 NOTE — Brief Op Note (Signed)
08/28/2019  12:39 PM  PATIENT:  Haley Robinson  79 y.o. female  PRE-OPERATIVE DIAGNOSIS:  Stenosis - myelopathy  POST-OPERATIVE DIAGNOSIS:  Stenosis - myelopathy  PROCEDURE:  Procedure(s): Anterior Cervical Decompression/Discectomy Fusion - Cervical four-Cervical five - Cervical five-Cervical six - Cervical six-Cervical seven (N/A)  SURGEON:  Surgeon(s) and Role:    Julio Sicks, MD - Primary  PHYSICIAN ASSISTANT:   ASSISTANTSMarland Mcalpine   ANESTHESIA:   general  EBL:  100 mL   BLOOD ADMINISTERED:none  DRAINS: none   LOCAL MEDICATIONS USED:  NONE  SPECIMEN:  No Specimen  DISPOSITION OF SPECIMEN:  N/A  COUNTS:  YES  TOURNIQUET:  * No tourniquets in log *  DICTATION: .Dragon Dictation  PLAN OF CARE: Admit to inpatient   PATIENT DISPOSITION:  PACU - hemodynamically stable.   Delay start of Pharmacological VTE agent (>24hrs) due to surgical blood loss or risk of bleeding: yes

## 2019-08-28 NOTE — Transfer of Care (Signed)
Immediate Anesthesia Transfer of Care Note  Patient: Haley Robinson  Procedure(s) Performed: Anterior Cervical Decompression/Discectomy Fusion - Cervical four-Cervical five - Cervical five-Cervical six - Cervical six-Cervical seven (N/A Spine Cervical)  Patient Location: PACU  Anesthesia Type:General  Level of Consciousness: drowsy  Airway & Oxygen Therapy: Patient Spontanous Breathing and Patient connected to nasal cannula oxygen  Post-op Assessment: Report given to RN and Post -op Vital signs reviewed and stable  Post vital signs: Reviewed and stable  Last Vitals:  Vitals Value Taken Time  BP 147/55 08/28/19 1305  Temp    Pulse 60 08/28/19 1306  Resp 20 08/28/19 1306  SpO2 94 % 08/28/19 1306  Vitals shown include unvalidated device data.  Last Pain:  Vitals:   08/28/19 0750  TempSrc:   PainSc: 0-No pain         Complications: No complications documented.

## 2019-08-28 NOTE — Anesthesia Procedure Notes (Signed)
Procedure Name: Intubation Date/Time: 08/28/2019 10:20 AM Performed by: Quentin Ore, CRNA Pre-anesthesia Checklist: Patient identified, Emergency Drugs available, Suction available, Patient being monitored and Timeout performed Patient Re-evaluated:Patient Re-evaluated prior to induction Oxygen Delivery Method: Circle system utilized Preoxygenation: Pre-oxygenation with 100% oxygen Induction Type: IV induction Ventilation: Mask ventilation without difficulty Laryngoscope Size: Glidescope and 3 Tube type: Oral Tube size: 7.0 mm Number of attempts: 1 Airway Equipment and Method: Stylet and Video-laryngoscopy Placement Confirmation: ETT inserted through vocal cords under direct vision,  positive ETCO2 and breath sounds checked- equal and bilateral Secured at: 21 cm Tube secured with: Tape Dental Injury: Teeth and Oropharynx as per pre-operative assessment  Comments: Maintained neck in neutral cervical alignment.

## 2019-08-28 NOTE — Op Note (Signed)
Date of procedure: 08/28/2019  Date of dictation: Same  Service: Neurosurgery  Preoperative diagnosis: Cervical stenosis with myelopathy  Postoperative diagnosis: Same  Procedure Name: C4-5, C5-6, C6-7 anterior cervical discectomy with interbody fusion utilizing interbody peek cages, locally harvested autograft, and anterior plate instrumentation  Surgeon:Cayden Rautio A.Athene Schuhmacher, M.D.  Asst. Surgeon: Doran Durand, NP  Anesthesia: General  Indication: 79 year old female with progressive bilateral upper extremity numbness paresthesias and weakness with increasing gait instability and spasticity.  Work-up demonstrates evidence of severe cervical spondylosis with associated stenosis causing severe cord compression particularly at C5-6 and C6-7 where there is marked signal abnormality of the spinal cord and at C4-5 where there is some anterior listhesis.  Patient has failed conservative management presents now for anterior cervical decompression and fusion in hopes of improving her symptoms.  Operative note: After induction anesthesia, patient positioned supine with neck slightly extended and held placed halter traction.  Patient's anterior cervical region prepped draped sterilely.  Incision made overlying C5-6.  Dissection performed on the right.  Retractor placed.  Fluoroscopy used.  Levels confirmed.  Displaces then incised at all 3 levels.  Anterior osteophytes removed using high-speed drill and Kerrison Rogers.  Discectomy was then performed using various instruments down to level the posterior annulus.  Microscope then brought to the field used throughout the remainder of the discectomy.  Remaining aspects annulus and osteophytes removed using high-speed drill down to level the posterior logical ligament.  Posterior logical was then elevated and resected in piecemeal fashion.  A wide central decompression then performed undercutting the bodies of C4 and C5.  Decompression then proceeded into each neural  foramina.  Wide anterior foraminotomies were performed along course exiting C5 nerve roots bilaterally.  Procedures then repeated at C5-6 and C6-7 again without complications.  Wound is then irrigated with antibiotic solution.  Gelfoam was placed operative hemostasis then removed.  [employer] anatomic peek cages were packed with locally harvested autograft and each cage was impacted into place at all 3 levels and recess slightly from the anterior cortical margins.  Atlantis translational plate was then placed over the C4-C7 levels.  This then attached under fluoroscopic guidance using 13 mm fixed angle screws to each at all 4 levels.  All screws given final tightening found to be solidly the bone.  Locking screws engaged all levels.  Final images reveal good position of the cages and the hardware with proper upper level with normal alignment of the spine.  Wound is then inspected for hemostasis which found to be good.  Wounds and closed in layers with Vicryl sutures.  Steri-Strips and sterile dressing were applied.  No apparent complications.  Patient tolerated the procedure well and she returned to the recovery room postop.

## 2019-08-28 NOTE — H&P (Signed)
Haley Robinson is an 79 y.o. female.   Chief Complaint: Weakness HPI: 79 year old female with bilateral upper extremity numbness paresthesias and weakness with associated gait instability and spasticity.  Work-up demonstrates evidence of marked cervical spondylosis with severe stenosis.  Patient presents now for anterior cervical decompression and fusion in hopes of improving her symptoms.  Past Medical History:  Diagnosis Date  . Anxiety    w/ procedures  . Carotid bruit   . Hyperlipidemia   . Hypertension   . Numbness and tingling in both hands   . Osteoarthritis    hands  . Pneumonia    x 1 - over 30 yrs ago  . PONV (postoperative nausea and vomiting)    Nausea with hysterectomy surgery only, no problems with other procedures/surgeries  . Spinal stenosis   . Vertigo, benign positional    x 4 years  . Vitamin D deficiency     Past Surgical History:  Procedure Laterality Date  . ABDOMINAL HYSTERECTOMY  1981  . ABDOMINAL HYSTERECTOMY    . CATARACT EXTRACTION, BILATERAL  11/2018  . COLONOSCOPY  06/2018  . TONSILLECTOMY    . WISDOM TOOTH EXTRACTION      Family History  Problem Relation Age of Onset  . Alzheimer's disease Mother   . Lung cancer Father    Social History:  reports that she has been smoking cigarettes. She has a 29.00 pack-year smoking history. She has never used smokeless tobacco. She reports that she does not drink alcohol and does not use drugs.  Allergies: No Known Allergies  Medications Prior to Admission  Medication Sig Dispense Refill  . aspirin 81 MG EC tablet Take 81 mg by mouth daily.    . hydrochlorothiazide (HYDRODIURIL) 12.5 MG tablet Take 12.5 mg by mouth daily.     . simvastatin (ZOCOR) 40 MG tablet Take 40 mg by mouth daily at 6 PM.     . ALPRAZolam (XANAX) 0.5 MG tablet for sedation before MRI scan; take 1 tab 1 hour before scan; may repeat 1 tab 15 min before scan (Patient taking differently: Take 0.5 mg by mouth. for sedation before MRI  scan; take 1 tab 1 hour before scan; may repeat 1 tab 15 min before scan) 3 tablet 0  . meclizine (ANTIVERT) 12.5 MG tablet Take 12.5 mg by mouth daily as needed for dizziness.       Results for orders placed or performed during the hospital encounter of 08/28/19 (from the past 48 hour(s))  Basic metabolic panel     Status: Abnormal   Collection Time: 08/28/19  7:30 AM  Result Value Ref Range   Sodium 143 135 - 145 mmol/L   Potassium 3.6 3.5 - 5.1 mmol/L   Chloride 106 98 - 111 mmol/L   CO2 26 22 - 32 mmol/L   Glucose, Bld 117 (H) 70 - 99 mg/dL    Comment: Glucose reference range applies only to samples taken after fasting for at least 8 hours.   BUN 10 8 - 23 mg/dL   Creatinine, Ser 3.01 0.44 - 1.00 mg/dL   Calcium 9.1 8.9 - 60.1 mg/dL   GFR calc non Af Amer 57 (L) >60 mL/min   GFR calc Af Amer >60 >60 mL/min   Anion gap 11 5 - 15    Comment: Performed at [facility], [address], [place], [place] [address]  CBC WITH DIFFERENTIAL     Status: None   Collection Time: 08/28/19  7:30 AM  Result Value Ref Range   WBC 7.4 4.0 - 10.5 K/uL   RBC 4.16 3.87 - 5.11 MIL/uL   Hemoglobin 13.0 12.0 - 15.0 g/dL   HCT 61.9 36 - 46 %   MCV 98.6 80.0 - 100.0 fL   MCH 31.3 26.0 - 34.0 pg   MCHC 31.7 30.0 - 36.0 g/dL   RDW 50.9 32.6 - 71.2 %   Platelets 252 150 - 400 K/uL   nRBC 0.0 0.0 - 0.2 %   Neutrophils Relative % 65 %   Neutro Abs 4.9 1.7 - 7.7 K/uL   Lymphocytes Relative 25 %   Lymphs Abs 1.8 0.7 - 4.0 K/uL   Monocytes Relative 7 %   Monocytes Absolute 0.5 0 - 1 K/uL   Eosinophils Relative 2 %   Eosinophils Absolute 0.1 0 - 0 K/uL   Basophils Relative 1 %   Basophils Absolute 0.1 0 - 0 K/uL   Immature Granulocytes 0 %   Abs Immature Granulocytes 0.03 0.00 - 0.07 K/uL    Comment: Performed at [facility], [address] [place], [place] [address]  Type and screen [facility]     Status: None   Collection Time: 08/28/19  8:00 AM  Result Value Ref  Range   ABO/RH(D) A POS    Antibody Screen NEG    Sample Expiration      08/31/2019,2359 Performed at [facility], [address], [place], [place] [address]   ABO/Rh     Status: None   Collection Time: 08/28/19  8:01 AM  Result Value Ref Range   ABO/RH(D)      A POS Performed at [facility], [address], [place], [place] [address]    No results found.  Pertinent items noted in HPI and remainder of comprehensive ROS otherwise negative.  Blood pressure (!) 141/51, pulse 56, temperature 98.2 F (36.8 C), temperature source Oral, resp. rate 17, height 5\' 5"  (1.651 m), weight 62.6 kg, SpO2 97 %.  Patient is awake and alert.  She is oriented and appropriate.  Speech is fluent.  Judgment insight are intact.  Cranial nerve function normal bilateral motor examination extremities reveals weakness in both hands with grip strength and 4/5 and intrinsic strength 4/5.  She has some spasticity in both lower extremities.  Her gait is unsteady and somewhat spastic.  Reflexes are brisk.  Hoffmann's responses are present in both hands.  Examination head ears eyes nose throat is unremarked.  Chest and abdomen are benign.  Extremities are free from injury deformity.  Sensory exam with patchy distal sensory loss in both upper and lower extremities. Assessment/Plan Severe cervical stenosis with myelopathy.  Plan C4-5, C5-6, C6-7 anterior cervical discectomy and fusion.  Risks and benefits of been explained.  Patient wishes to proceed.  Winefred Hillesheim A Kortne All 08/28/2019, 10:08 AM

## 2019-08-28 NOTE — Anesthesia Postprocedure Evaluation (Signed)
Anesthesia Post Note  Patient: DAUNA ZISKA  Procedure(s) Performed: Anterior Cervical Decompression/Discectomy Fusion - Cervical four-Cervical five - Cervical five-Cervical six - Cervical six-Cervical seven (N/A Spine Cervical)     Patient location during evaluation: PACU Anesthesia Type: General Level of consciousness: awake and alert, awake and oriented Pain management: pain level controlled Vital Signs Assessment: post-procedure vital signs reviewed and stable Respiratory status: spontaneous breathing, nonlabored ventilation and respiratory function stable Cardiovascular status: blood pressure returned to baseline and stable Postop Assessment: no apparent nausea or vomiting Anesthetic complications: no   No complications documented.  Last Vitals:  Vitals:   08/28/19 0735 08/28/19 1305  BP: (!) 141/51 (!) 147/55  Pulse: 56 65  Resp: 17 20  Temp: 36.8 C   SpO2: 97% 97%    Last Pain:  Vitals:   08/28/19 1305  TempSrc:   PainSc: Asleep    LLE Motor Response: Other (Comment) (UTA due to sedation ) (08/28/19 1305) LLE Sensation: Other (Comment) (UTA due to sedation ) (08/28/19 1305) RLE Motor Response: Other (Comment) (UTA due to sedation ) (08/28/19 1305) RLE Sensation: Other (Comment) (UTA due to sedation ) (08/28/19 1305)      Cecile Hearing

## 2019-08-29 NOTE — Evaluation (Signed)
Physical Therapy Evaluation Patient Details Name: Haley Robinson MRN: [id number] DOB: 06-22-1940 Today's Date: 08/29/2019   History of Present Illness  Pt is a 79 y.o. F is a significant PMH of HTN who presents with marked cervical spondyosis who presents s/p C4-7 anterior cervical discectomy with interbody fusion. Now with significant worsening of her quadriparesis posteroperatively.  Clinical Impression  Prior to admission, pt lives with her daughter and is independent; she enjoys gardening. On PT evaluation, pt presents with significant change from her functional baseline. Displays weakness in all four extremities (RLE 0/5 other than ankle plantarflexion), impaired tone, impaired sensation to light touch, decreased coordination, poor sitting balance. Pt requiring two person maximal assist for bed mobility. Pt unable to stand with two person total assist or use of Stedy. Will need intensive therapies to address deficits and maximize functional mobility. Has good family support, activity tolerance and is motivated.      Follow Up Recommendations CIR    Equipment Recommendations  Other (comment) (defer)    Recommendations for Other Services       Precautions / Restrictions Precautions Precautions: Fall;Other (comment);Cervical Precaution Booklet Issued: No Precaution Comments: LLE spasticity Required Braces or Orthoses: Cervical Brace Cervical Brace: Soft collar Restrictions Weight Bearing Restrictions: No      Mobility  Bed Mobility Overal bed mobility: Needs Assistance Bed Mobility: Rolling;Sidelying to Sit;Sit to Sidelying Rolling: Mod assist;+2 for physical assistance Sidelying to sit: Max assist;+2 for physical assistance     Sit to sidelying: Max assist;+2 for physical assistance General bed mobility comments: Pt hooking with L elbow to assist into right sidelying, assist with legs and trunk to sit up on edge of bed. Assist to return to supine to guide trunk back down  and elevate legs back onto bed.  Transfers Overall transfer level: Needs assistance Equipment used: None;Ambulation equipment used Transfers: Sit to/from Stand Sit to Stand: Total assist;+2 physical assistance         General transfer comment: Pt requiring totalA + 2 and knee block with attempts to stand from edge of bed with use of bed pad and with Stedy; pt with hip clearance but unable to achieve upright. has difficulty holding onto Stedy bars due to poor grip.   Ambulation/Gait             General Gait Details: Unable  Stairs            Wheelchair Mobility    Modified Rankin (Stroke Patients Only)       Balance Overall balance assessment: Needs assistance Sitting-balance support: Feet supported;Bilateral upper extremity supported Sitting balance-Leahy Scale: Poor Sitting balance - Comments: Pt requiring min-maxA for static sitting balance and BUE support. Poor trunk control/coordination                                     Pertinent Vitals/Pain Pain Assessment: 0-10 Pain Score: 0-No pain Pain Location: "I dont have pain but I am very tingly" Pain Descriptors / Indicators: Tingling Pain Intervention(s): Monitored during session    Home Living Family/patient expects to be discharged to:: Private residence Living Arrangements: Children (daughter) Available Help at Discharge: Family;Available PRN/intermittently (daughter works) Type of Home: House Home Access: Stairs to enter Entrance Stairs-Rails: Acupuncturist of Steps: 6-7 Home Layout: One level Home Equipment: Walker - 2 wheels      Prior Function Level of Independence: Independent  Comments: reports being independent, started to lose function of her hands     Hand Dominance   Dominant Hand: Left    Extremity/Trunk Assessment   Upper Extremity Assessment Upper Extremity Assessment: Defer to OT evaluation    Lower Extremity Assessment Lower  Extremity Assessment: RLE deficits/detail;LLE deficits/detail RLE Deficits / Details: 0/5 except for 2/5 ankle plantarflexion RLE Sensation: decreased light touch LLE Deficits / Details: MMT: hip flexion 1/5, knee extension 2/5, ankle dorsiflexion/plantarflexion 5/5    Cervical / Trunk Assessment Cervical / Trunk Assessment: Other exceptions Cervical / Trunk Exceptions: s/p C4-7 ACDF  Communication   Communication: No difficulties  Cognition Arousal/Alertness: Awake/alert Behavior During Therapy: WFL for tasks assessed/performed Overall Cognitive Status: Within Functional Limits for tasks assessed                                        General Comments      Exercises     Assessment/Plan    PT Assessment Patient needs continued PT services  PT Problem List Decreased strength;Decreased activity tolerance;Decreased balance;Decreased mobility;Impaired sensation;Impaired tone       PT Treatment Interventions DME instruction;Gait training;Functional mobility training;Therapeutic activities;Therapeutic exercise;Balance training;Patient/family education;Wheelchair mobility training    PT Goals (Current goals can be found in the Care Plan section)  Acute Rehab PT Goals Patient Stated Goal: be able to walk, be more independent PT Goal Formulation: With patient Time For Goal Achievement: 09/12/19 Potential to Achieve Goals: Good    Frequency Min 5X/week   Barriers to discharge        Co-evaluation PT/OT/SLP Co-Evaluation/Treatment: Yes Reason for Co-Treatment: For patient/therapist safety;To address functional/ADL transfers;Complexity of the patient's impairments (multi-system involvement) PT goals addressed during session: Mobility/safety with mobility;Balance         AM-PAC PT "6 Clicks" Mobility  Outcome Measure Help needed turning from your back to your side while in a flat bed without using bedrails?: A Lot Help needed moving from lying on your back  to sitting on the side of a flat bed without using bedrails?: Total Help needed moving to and from a bed to a chair (including a wheelchair)?: Total Help needed standing up from a chair using your arms (e.g., wheelchair or bedside chair)?: Total Help needed to walk in hospital room?: Total Help needed climbing 3-5 steps with a railing? : Total 6 Click Score: 7    End of Session   Activity Tolerance: Patient tolerated treatment well Patient left: in bed;with call bell/phone within reach Nurse Communication: Mobility status PT Visit Diagnosis: Other abnormalities of gait and mobility (R26.89)    Time: 5102-5852 PT Time Calculation (min) (ACUTE ONLY): 37 min   Charges:   PT Evaluation $PT Eval Moderate Complexity: 1 Mod            Lillia Pauls, PT, DPT Acute Rehabilitation Services Pager [phone] Office [phone]   Norval Morton 08/29/2019, 5:12 PM

## 2019-08-29 NOTE — Progress Notes (Signed)
Postop day 1.  Patient with significant worsening of her quadriparesis postoperatively.  No pain.  Follow-up MRI scan demonstrates good decompression without evidence of any new complicating features.  Patient still with significant high signal within her spinal cord at C5-6 although this looks somewhat improved from her preoperative state.  She is awake and alert.  She is afebrile.  Her vital signs are stable.  Her urine output is good.  Cranial nerve function normal bilaterally.  Speech fluent.  Motor examination with 5/5 deltoid and biceps strength bilaterally.  4+/5 wrist extensor strength bilaterally.  3/5 triceps strength bilaterally.  Minimal grip on the left absent grip and intrinsics on the right.  Patient with 4+/5 strength in her left lower extremity.  Patient with some intermittent voluntary movement in the right lower extremity.  Patient with reasonably dense sensory level around C7 distally.  Wound clean and dry.  Chest and abdomen benign.  Patient with severe preoperative myelopathy now worsen following decompressive surgery.  Begin efforts at rehabilitation.  No indication for any further decompression or further surgical interventions.  Given her good lower extremity strength I would expect her spinal cord to gradually improve over the next few weeks.

## 2019-08-29 NOTE — Progress Notes (Signed)
Patient refusing to ambulate or stand at side of the bed, "Patient keeps stating that she cannot move her legs", RN educated about ambulation after surgery and better outcomes during recovery.  RN keeps reminding patient that she can move her arms and legs.  Patient almost kicked both nurses with her right leg when repositioning.  RN has seen patient remove drink cup from bedside table and reminded patient that her arms do move they are just weak at this time.

## 2019-08-29 NOTE — Progress Notes (Signed)
Orthopedic Tech Progress Note Patient Details:  Haley Robinson 13-Oct-1940 [id number] RN said patient has collar Patient ID: [name], female   DOB: [birth year], 79 y.o.   MRN: [id number]   [name] 08/29/2019, 9:51 AM

## 2019-08-29 NOTE — Evaluation (Signed)
Occupational Therapy Evaluation Patient Details Name: Haley Robinson MRN: [id number] DOB: [birth year] Today's Date: 08/29/2019    History of Present Illness Pt is a 79 y.o. F with a significant PMH of HTN who presents with marked cervical spondyosis who presents s/p C4-7 anterior cervical discectomy with interbody fusion. Now with significant worsening of her quadriparesis posteroperatively.   Clinical Impression   PTA pt living with daughter and functioning at independent level. She reports a long history of increasing weakness/numbness in her hands but otherwise is independent and enjoys gardening.  At time of eval, pt presents with ability to complete bed mobility at max A +2 and sit <> stands at total A +2. Noted increased weakness and decreased sensation on R side. Pt R leg is not currently activating in transfers. RUE is presenting with decreased strength and sensation in extensors, absent sensation in hand. LUE (dominant) also presents with decreased strength/sensation in extensors but not as severe, also with absent sensation in hand. Pt is not able to functionally grasp with this dominant UE. She is currently max A for feeding/grooming/UB BADL and total A for LB BADL.  During session, pt was able to maintain sitting balance with fluctuating min-max A due to poor trunk control and coordination. Issued pt soft touch call bell this session. Given current status, recommend CIR to support safety, BADL engagement, independent PLOF. OT will continue to follow per POC listed below.   Follow Up Recommendations  CIR    Equipment Recommendations  Other (comment) (TBD)    Recommendations for Other Services Rehab consult     Precautions / Restrictions Precautions Precautions: Fall;Other (comment);Cervical Precaution Booklet Issued: No Precaution Comments: LLE spasticity Required Braces or Orthoses: Cervical Brace Cervical Brace: Soft collar Restrictions Weight Bearing Restrictions: No Other  Position/Activity Restrictions: no sensation/functional use of bil hands      Mobility Bed Mobility Overal bed mobility: Needs Assistance Bed Mobility: Rolling;Sidelying to Sit;Sit to Sidelying Rolling: Mod assist;+2 for physical assistance Sidelying to sit: Max assist;+2 for physical assistance     Sit to sidelying: Max assist;+2 for physical assistance General bed mobility comments: Pt hooking with L elbow to assist into right sidelying, assist with legs and trunk to sit up on edge of bed. Assist to return to supine to guide trunk back down and elevate legs back onto bed.  Transfers Overall transfer level: Needs assistance Equipment used: None;Ambulation equipment used Transfers: Sit to/from Stand Sit to Stand: Total assist;+2 physical assistance         General transfer comment: Pt requiring totalA + 2 and knee block with attempts to stand from edge of bed with use of bed pad and with Stedy; pt with hip clearance but unable to achieve upright. has difficulty holding onto Stedy bars due to poor grip.     Balance Overall balance assessment: Needs assistance Sitting-balance support: Feet supported;Bilateral upper extremity supported Sitting balance-Leahy Scale: Poor Sitting balance - Comments: Pt requiring min-maxA for static sitting balance and BUE support. Poor trunk control/coordination. Difficulty maintaining propping due to poor sensation   Standing balance support: Bilateral upper extremity supported;No upper extremity supported Standing balance-Leahy Scale: Zero                             ADL either performed or assessed with clinical judgement   ADL Overall ADL's : Needs assistance/impaired Eating/Feeding: Maximal assistance;Bed level Eating/Feeding Details (indicate cue type and reason): not able to functionally grasp utensils  Grooming: Maximal assistance;Bed level   Upper Body Bathing: Maximal assistance;Bed level   Lower Body Bathing: Total  assistance;Sitting/lateral leans;Sit to/from stand   Upper Body Dressing : Maximal assistance;Bed level   Lower Body Dressing: Total assistance;Sitting/lateral leans;Sit to/from stand   Toilet Transfer: Total assistance;+2 for physical assistance;+2 for safety/equipment Toilet Transfer Details (indicate cue type and reason): attempted sit <> stand with and without stedy. Pt not able to clear hips/bear weight through bil legs Toileting- Clothing Manipulation and Hygiene: Total assistance Toileting - Clothing Manipulation Details (indicate cue type and reason): using purewick but reports urinary sensation. Has not yet had BM to know if she has urge or not     Functional mobility during ADLs: Maximal assistance;+2 for physical assistance;+2 for safety/equipment (EOB only)       Vision Patient Visual Report: No change from baseline       Perception     Praxis      Pertinent Vitals/Pain Pain Assessment: 0-10 Pain Score: 0-No pain Pain Location: "I dont have pain but I am very tingly" Pain Descriptors / Indicators: Tingling Pain Intervention(s): Monitored during session     Hand Dominance Left   Extremity/Trunk Assessment Upper Extremity Assessment Upper Extremity Assessment: RUE deficits/detail;LUE deficits/detail RUE Deficits / Details: absent sensation in hand; decreased sensation in extensors. Poor tricep coordination/control as well as wrist extension. Not able to functionally grasp. Biceps are intact RUE Sensation: decreased light touch RUE Coordination: decreased fine motor;decreased gross motor LUE Deficits / Details: dominant UE. Limited extensor strength/sensation. Absent sensation in bil hands; not able to functionally grasp. Biceps intact LUE Sensation: decreased light touch LUE Coordination: decreased fine motor;decreased gross motor   Lower Extremity Assessment Lower Extremity Assessment: Defer to PT evaluation RLE Deficits / Details: 0/5 except for 2/5 ankle  plantarflexion RLE Sensation: decreased light touch LLE Deficits / Details: MMT: hip flexion 1/5, knee extension 2/5, ankle dorsiflexion/plantarflexion 5/5   Cervical / Trunk Assessment Cervical / Trunk Assessment: Other exceptions Cervical / Trunk Exceptions: s/p C4-7 ACDF   Communication Communication Communication: No difficulties   Cognition Arousal/Alertness: Awake/alert Behavior During Therapy: WFL for tasks assessed/performed Overall Cognitive Status: Within Functional Limits for tasks assessed                                     General Comments       Exercises     Shoulder Instructions      Home Living Family/patient expects to be discharged to:: Private residence Living Arrangements: Children (daughter) Available Help at Discharge: Family;Available PRN/intermittently (daughter works) Type of Home: House Home Access: Stairs to enter Secretary/administrator of Steps: 6-7 Entrance Stairs-Rails: Left Home Layout: One level     Bathroom Shower/Tub: Walk-in shower;Tub/shower unit   Bathroom Toilet: Handicapped height     Home Equipment: Walker - 2 wheels          Prior Functioning/Environment Level of Independence: Independent        Comments: reports being independent, started to lose function of her hands        OT Problem List: Decreased strength;Decreased knowledge of use of DME or AE;Impaired tone;Decreased coordination;Decreased range of motion;Decreased knowledge of precautions;Decreased activity tolerance;Impaired UE functional use;Impaired balance (sitting and/or standing);Impaired sensation      OT Treatment/Interventions: Self-care/ADL training;Therapeutic exercise;Patient/family education;Balance training;Neuromuscular education;Energy conservation;Therapeutic activities;DME and/or AE instruction    OT Goals(Current goals can be found in the care plan  section) Acute Rehab OT Goals Patient Stated Goal: be able to walk, be more  independent OT Goal Formulation: With patient Time For Goal Achievement: 09/12/19 Potential to Achieve Goals: Good  OT Frequency: Min 2X/week   Barriers to D/C:            Co-evaluation PT/OT/SLP Co-Evaluation/Treatment: Yes Reason for Co-Treatment: For patient/therapist safety;To address functional/ADL transfers;Complexity of the patient's impairments (multi-system involvement) PT goals addressed during session: Mobility/safety with mobility;Balance OT goals addressed during session: ADL's and self-care;Proper use of Adaptive equipment and DME;Strengthening/ROM      AM-PAC OT "6 Clicks" Daily Activity     Outcome Measure Help from another person eating meals?: A Lot Help from another person taking care of personal grooming?: A Lot Help from another person toileting, which includes using toliet, bedpan, or urinal?: Total Help from another person bathing (including washing, rinsing, drying)?: Total Help from another person to put on and taking off regular upper body clothing?: A Lot Help from another person to put on and taking off regular lower body clothing?: Total 6 Click Score: 9   End of Session Equipment Utilized During Treatment: Cervical collar;Other (comment) (stedy) Nurse Communication: Mobility status;Precautions  Activity Tolerance: Patient tolerated treatment well Patient left: in bed;with call bell/phone within reach;with bed alarm set  OT Visit Diagnosis: Unsteadiness on feet (R26.81);Other abnormalities of gait and mobility (R26.89);Muscle weakness (generalized) (M62.81);Other symptoms and signs involving the nervous system (R29.898)                Time: 2694-8546 OT Time Calculation (min): 37 min Charges:  OT General Charges $OT Visit: 1 Visit OT Evaluation $OT Eval Moderate Complexity: 1 Mod  Dalphine Handing, MSOT, OTR/L Acute Rehabilitation Services [facility] Office Number: [phone] Pager: [phone]  Dalphine Handing 08/29/2019, 5:54 PM

## 2019-08-30 ENCOUNTER — Encounter (HOSPITAL_COMMUNITY): Payer: Self-pay | Admitting: Neurosurgery

## 2019-08-30 MED ORDER — DEXAMETHASONE SODIUM PHOSPHATE 4 MG/ML IJ SOLN
4.0000 mg | Freq: Two times a day (BID) | INTRAMUSCULAR | Status: DC
  Administered 2019-08-30 – 2019-08-31 (×2): 4 mg via INTRAVENOUS
  Filled 2019-08-30 (×2): qty 1

## 2019-08-30 NOTE — PMR Pre-admission (Signed)
PMR Admission Coordinator Pre-Admission Assessment  Patient: Haley Robinson is an 79 y.o., female MRN: [id number] DOB: 11/20/1940 Height: '5\' 5"'  (165.1 cm) Weight: 62.6 kg  Insurance Information HMO:    PPO:      PCP:      IPA:      80/20:      OTHER: [place]  PRIMARY: [employer] Medicare Choice      Policy#: [id number]      Subscriber: [place] Name: Manning Charity      Phone#: [phone]     Fax#: [phone] Pre-Cert#: [id number] authorization good for 5 days.   Update due to Donell Sievert [phone] and fax [phone]      Employer: Retired Benefits:  Phone #: [phone]     Name: Online at [email or web address] Eff. Date: 02/02/19 - 02/01/20     Deduct: $0 for in network providers      Out of Pocket Max: $4000 (met $291.00)      Life Max: N/A CIR: $160/day copay for days 1-10; $0/day copay for days 11-90      SNF: 100% Outpatient: visit limited by medical necessity     Co-Pay: $20/visit Home Health: 100%      Co-Pay: none DME: 80%     Co-Pay: 20% Providers: in network  SECONDARY:  None      Policy#:       Phone#:    Development worker, community:        Phone#:    The Engineer, petroleum" for patients in Inpatient Rehabilitation Facilities with attached "Privacy Act Price Records" was provided and verbally reviewed with: Patient and Family  Emergency Contact Information Contact Information    Name Relation Home Work Mobile   [place] Daughter   [phone]      Current Medical History  Patient Admitting Diagnosis: Cervical myelopathy post C 4-7 discectomy/fusion  History of Present Illness:  A 79 year old female with bilateral upper extremity numbness paresthesias and weakness with associated gait instability and spasticity.  Work-up demonstrates evidence of marked cervical spondylosis with severe stenosis. Patient underwent C4-7 anterior cervical decompression and fusion on 08/28/19 by Dr. Earnie Larsson.  PT/OT evaluations  were completed with recommendations for acute inpatient rehab admission.  Will admit to acute inpatient rehab today.  Patient's medical record from [facility] has been reviewed by the rehabilitation admission coordinator and physician.  Past Medical History  Past Medical History:  Diagnosis Date  . Anxiety    w/ procedures  . Carotid bruit   . Hyperlipidemia   . Hypertension   . Numbness and tingling in both hands   . Osteoarthritis    hands  . Pneumonia    x 1 - over 30 yrs ago  . PONV (postoperative nausea and vomiting)    Nausea with hysterectomy surgery only, no problems with other procedures/surgeries  . Spinal stenosis   . Vertigo, benign positional    x 4 years  . Vitamin D deficiency     Family History   family history includes Alzheimer's disease in her mother; Lung cancer in her father.  Prior Rehab/Hospitalizations Has the patient had prior rehab or hospitalizations prior to admission? No.  Patient reports B cataract surgery as an outpatient procedure in October/November 2020.  She has no previous inpatient rehab admissions.  Has the patient had major surgery during 100 days prior to admission? Yes, patient had cervical decompression/fusion.   Current Medications  Current Facility-Administered Medications:  .  0.9 %  sodium chloride infusion, 250 mL, Intravenous, Continuous, Pool, Henry, MD, Last Rate: 10 mL/hr at 08/29/19 0300, Rate Verify at 08/29/19 0300 .  acetaminophen (TYLENOL) tablet 650 mg, 650 mg, Oral, Q4H PRN, 650 mg at 08/31/19 1956 **OR** acetaminophen (TYLENOL) suppository 650 mg, 650 mg, Rectal, Q4H PRN, Earnie Larsson, MD .  cyclobenzaprine (FLEXERIL) tablet 10 mg, 10 mg, Oral, TID PRN, Earnie Larsson, MD, 10 mg at 08/30/19 2154 .  dexamethasone (DECADRON) tablet 1 mg, 1 mg, Oral, Q12H, Pool, Henry, MD, 1 mg at 09/03/19 1025 .  hydrochlorothiazide (HYDRODIURIL) tablet 12.5 mg, 12.5 mg, Oral, Daily, Pool, Mallie Mussel, MD, 12.5 mg at 09/03/19  1024 .  HYDROcodone-acetaminophen (NORCO) 10-325 MG per tablet 2 tablet, 2 tablet, Oral, Q4H PRN, Earnie Larsson, MD, 2 tablet at 09/02/19 2205 .  HYDROcodone-acetaminophen (NORCO/VICODIN) 5-325 MG per tablet 1 tablet, 1 tablet, Oral, Q4H PRN, Earnie Larsson, MD, 1 tablet at 09/02/19 1303 .  HYDROmorphone (DILAUDID) injection 1 mg, 1 mg, Intravenous, Q2H PRN, Pool, Mallie Mussel, MD .  menthol-cetylpyridinium (CEPACOL) lozenge 3 mg, 1 lozenge, Oral, PRN **OR** phenol (CHLORASEPTIC) mouth spray 1 spray, 1 spray, Mouth/Throat, PRN, Pool, Mallie Mussel, MD .  ondansetron (ZOFRAN) tablet 4 mg, 4 mg, Oral, Q6H PRN **OR** ondansetron (ZOFRAN) injection 4 mg, 4 mg, Intravenous, Q6H PRN, Earnie Larsson, MD, 4 mg at 08/28/19 2117 .  simvastatin (ZOCOR) tablet 40 mg, 40 mg, Oral, q1800, Earnie Larsson, MD, 40 mg at 09/02/19 1744 .  sodium chloride flush (NS) 0.9 % injection 3 mL, 3 mL, Intravenous, Q12H, Pool, Mallie Mussel, MD, 3 mL at 09/03/19 1031 .  sodium chloride flush (NS) 0.9 % injection 3 mL, 3 mL, Intravenous, PRN, Earnie Larsson, MD  Patients Current Diet:  Diet Order            Diet regular Room service appropriate? Yes; Fluid consistency: Thin  Diet effective now                 Precautions / Restrictions Precautions Precautions: Fall Precaution Booklet Issued: No Precaution Comments: verbally review cervical precautions, L sided spasticity Cervical Brace: Soft collar Restrictions Weight Bearing Restrictions: No Other Position/Activity Restrictions: no sensation/functional use of bil hands   Has the patient had 2 or more falls or a fall with injury in the past year? No  Prior Activity Level Limited Community (1-2x/wk): Went out 3 days a week.  took dog to the vet, played cards on Friday, went out with friends on Wednesday, went grocery shopping.  Prior Functional Level Self Care: Did the patient need help bathing, dressing, using the toilet or eating? Independent  Indoor Mobility: Did the patient need assistance  with walking from room to room (with or without device)? Independent  Stairs: Did the patient need assistance with internal or external stairs (with or without device)? Independent  Functional Cognition: Did the patient need help planning regular tasks such as shopping or remembering to take medications? Independent  Home Assistive Devices / [place] Devices/Equipment: Eyeglasses Home Equipment: Walker - 2 wheels  Prior Device Use: Indicate devices/aids used by the patient prior to current illness, exacerbation or injury? None of the above  Current Functional Level Cognition  Overall Cognitive Status: Within Functional Limits for tasks assessed Orientation Level: Oriented X4    Extremity Assessment (includes Sensation/Coordination)  Upper Extremity Assessment: RUE deficits/detail, LUE deficits/detail RUE Deficits / Details: diminished hand strength to a 3- out 5 gross grasp. 3 out 5 bicep and weaker tricep  RUE Sensation: decreased  light touch RUE Coordination: decreased fine motor, decreased gross motor LUE Deficits / Details: decrease coordination for wrist extension pt initially pulling arm into flexion. pt requires multiple attempts to coordinate wrist extension, grasp 3 out 5 , pt is left hand dominant with decrease tricep LUE Sensation: decreased light touch LUE Coordination: decreased fine motor, decreased gross motor  Lower Extremity Assessment: RLE deficits/detail RLE Deficits / Details: 0/5 and not movement at the toes. pt with extensor tone with input from therapist noted in supine, unable to initiate or sustain hip flexion RLE Sensation: decreased light touch RLE Coordination: decreased fine motor, decreased gross motor LLE Deficits / Details: pt with internal ankle rotation when attempting knee extension. pt unable to sustain hip flexion/ knee flexion but activated. pt able to move toes and ankle    ADLs  Overall ADL's : Needs  assistance/impaired Eating/Feeding: Minimal assistance, Bed level, With adaptive utensils Eating/Feeding Details (indicate cue type and reason): pt using bil UE on standard cup drink with straw. pt reports ability to use red foam utensil with breakfast. daughter present to confirm Grooming: Maximal assistance, Bed level Grooming Details (indicate cue type and reason): decrease coordinated movement to reach nose Upper Body Bathing: Maximal assistance, Bed level Lower Body Bathing: Total assistance, Sitting/lateral leans, Sit to/from stand Upper Body Dressing : Maximal assistance, Bed level Lower Body Dressing: Total assistance, Sitting/lateral leans, Sit to/from stand Toilet Transfer: Total assistance, +2 for physical assistance, +2 for safety/equipment Toilet Transfer Details (indicate cue type and reason): attempted sit <> stand with and without stedy. Pt not able to clear hips/bear weight through bil legs Toileting- Clothing Manipulation and Hygiene: Total assistance Toileting - Clothing Manipulation Details (indicate cue type and reason): using purewick but reports urinary sensation. Has not yet had BM to know if she has urge or not Functional mobility during ADLs: Maximal assistance, +2 for physical assistance, +2 for safety/equipment (EOB only) General ADL Comments: session focused on eob static sitting balance.     Mobility  Overal bed mobility: Needs Assistance Bed Mobility: Rolling, Sidelying to Sit Rolling: Mod assist Sidelying to sit: Max assist Supine to sit: +2 for safety/equipment Sit to sidelying: Max assist General bed mobility comments: Without using hook method, had pt reach from left to right and try coming into hip flexion with LE, but pt moved more into extension without moderate assist    Transfers  Overall transfer level: Needs assistance Equipment used: 1 person hand held assist Transfer via Lift Equipment: Stedy Transfers: Sit to/from Stand, [employer] Sit to Stand: Total assist Squat pivot transfers: Total assist  Lateral/Scoot Transfers: Total assist General transfer comment: cues to push up then "bear hug".  2 trials of standing, 1 blocking L knee and 1 trial blocking R knee.  pt attained upright stance x2, but much more control blocking R knee.    Ambulation / Gait / Stairs / Wheelchair Mobility  Ambulation/Gait General Gait Details: Unable    Posture / Balance Dynamic Sitting Balance Sitting balance - Comments: max assist generally, working on upright sitting with truncal extension optimizing position of UE's. Balance Overall balance assessment: Needs assistance Sitting-balance support: Bilateral upper extremity supported, Feet supported Sitting balance-Leahy Scale: Poor Sitting balance - Comments: max assist generally, working on upright sitting with truncal extension optimizing position of UE's. Postural control: Posterior lean, Left lateral lean, Right lateral lean Standing balance support: Bilateral upper extremity supported Standing balance-Leahy Scale: Zero Standing balance comment: totalA x2 trials attaining full knee extension  and standing for 10-15 secs each .    Special needs/care consideration Skin soft C-collar in place over post op surgical incision; has pressure are on coccyx that patient has expressed concerns about. Designated visitor Daughters Insurance account manager and [place] (from acute therapy documentation) Living Arrangements: Children (daughter) Available Help at Discharge: Family, Available PRN/intermittently (daughter works) Type of Home: House Home Layout: One level Home Access: Stairs to enter Entrance Stairs-Rails: Horticulturist, commercial of Steps: 6-7 Bathroom Shower/Tub: Gaffer, Chiropodist: Handicapped height  Discharge Living Setting Plans for Discharge Living Setting: Patient's home, [place], Lives with (comment) (Lives with her daughter,  Ludie Hudon.) Type of Home at Discharge: House Discharge Home Layout: One level Discharge Home Access: Ramped entrance (Ramp at the back entrance) Discharge Bathroom Shower/Tub: Tub/shower unit, Walk-in shower (Has both units in different bathrooms.) Discharge Bathroom Toilet: Handicapped height (one toilet is standard and another is elevated.) Discharge Bathroom Accessibility: No Does the patient have any problems obtaining your medications?: No  Social/Family/Support Systems Patient Roles: Parent, Other (Comment) (Is a widow of 5 years.  Has 2 daughters, grandson, sister) Contact Information: Nathaniel Man - daughter - [phone] Anticipated Caregiver: Marlise Eves, a sister, a grandson Anticipated Caregiver's Contact Information: Olivia Mackie [phone] and Ebony Hail [phone] Ability/Limitations of Caregiver: Dtr Allision works 8 to 430 pm M-F, Olivia Mackie works 8 to 12 noon M-F, Sister can help during the day, Yolanda Bonine can help 1-2 days a week.  Olivia Mackie plans to leave work and go to patients home daily as needed.  They are open to hiring help as needed. Caregiver Availability: 24/7 Olivia Mackie understands the need for 24/7 assist after DC) Discharge Plan Discussed with Primary Caregiver: Yes Is Caregiver In Agreement with Plan?: Yes Does Caregiver/Family have Issues with Lodging/Transportation while Pt is in Rehab?: No  Goals Patient/Family Goal for Rehab: PT/OT min/mod assist goals Expected length of stay: 20-24 days Cultural Considerations: None Pt/Family Agrees to Admission and willing to participate: Yes Program Orientation Provided & Reviewed with Pt/Caregiver Including Roles  & Responsibilities: Yes  Decrease burden of Care through IP rehab admission: N/A  Possible need for SNF placement upon discharge: Patient and family do not want SNF placement.  Patient Condition: I have reviewed medical records from [facility], spoken with CM, and patient and daughter. I met  with patient at the bedside for inpatient rehabilitation assessment.  Patient will benefit from ongoing PT and OT, can actively participate in 3 hours of therapy a day 5 days of the week, and can make measurable gains during the admission.  Patient will also benefit from the coordinated team approach during an Inpatient Acute Rehabilitation admission.  The patient will receive intensive therapy as well as Rehabilitation physician, nursing, social worker, and care management interventions.  Due to bladder management, bowel management, safety, skin/wound care, disease management, medication administration, pain management and patient education the patient requires 24 hour a day rehabilitation nursing.  The patient is currently max to total assist with mobility and basic ADLs.  Discharge setting and therapy post discharge at home with home health is anticipated.  Patient has agreed to participate in the Acute Inpatient Rehabilitation Program and will admit today.  Preadmission Screen Completed By:  Retta Diones, 09/03/2019 11:12 AM ______________________________________________________________________   Discussed status with Dr. Letta Pate on 09/03/2019 at 1000 and received approval for admission today.  Admission Coordinator:  Karene Fry, RN and Raechel Ache, OT, time 1113/Date 09/03/2019   Assessment/Plan: Diagnosis:Cervical  stenosis with myelopathy  1. Does the need for close, 24 hr/day Medical supervision in concert with the patient's rehab needs make it unreasonable for this patient to be served in a less intensive setting? Yes 2. Co-Morbidities requiring supervision/potential complications: HTN , post op pain 3. Due to bladder management, bowel management, safety, skin/wound care, disease management, medication administration, pain management and patient education, does the patient require 24 hr/day rehab nursing? Yes 4. Does the patient require coordinated care of a physician, rehab nurse, PT, OT,  and SLP to address physical and functional deficits in the context of the above medical diagnosis(es)? Yes Addressing deficits in the following areas: balance, endurance, locomotion, strength, transferring, bowel/bladder control, bathing, dressing, feeding, grooming, toileting and psychosocial support 5. Can the patient actively participate in an intensive therapy program of at least 3 hrs of therapy 5 days a week? Yes 6. The potential for patient to make measurable gains while on inpatient rehab is good 7. Anticipated functional outcomes upon discharge from inpatient rehab: min assist and mod assist PT, min assist and mod assist OT, n/a SLP 8. Estimated rehab length of stay to reach the above functional goals is: 20-24 d 9. Anticipated discharge destination: Home 10. Overall Rehab/Functional Prognosis: good   MD Signature: Charlett Blake M.D. [facility] FAAPM&R (Neuromuscular Med) Diplomate Am Board of Electrodiagnostic Med Fellow Am Board of Interventional Pain

## 2019-08-30 NOTE — Progress Notes (Signed)
IP rehab admissions:  I met with patient and her daughter, Olivia Mackie, late yesterday afternoon and again this morning.  I gave them rehab booklets, described the rehab stay and answered many questions.  Patient upset yesterday and today about increased deficits in legs and arms.  Her daughter and family can provide 24/7 support after a potential CIR stay.  Dtr is very supportive.  They would like CIR and they do not want to go to a SNF if they can avoid it.  I will begin checking benefits and then open the case to request acute inpatient rehab admission.  I/ my partner, Claiborne Billings, will follow up once we hear back from insurance case manager.  Call me for questions.  [phone]

## 2019-08-30 NOTE — Progress Notes (Signed)
Occupational Therapy Treatment Patient Details Name: Haley Robinson MRN: [id number] DOB: 14-Sep-1940 Today's Date: 08/30/2019    History of present illness Pt is a 79 y.o. F with a significant PMH of HTN who presents with marked cervical spondyosis who presents s/p C4-7 anterior cervical discectomy with interbody fusion. Now with significant worsening of her quadriparesis posteroperatively.   OT comments  Worked on finger flex/ext as well as functional reach.  She demonstrates improving AROM bil. Hands and ability to use hands functionally.  Provided with adaptive equipment for self feeding, instructed she and daughter in use and encouraged to start practicing self feeding.  Pt is an excellent CIR candidate.   Follow Up Recommendations  CIR    Equipment Recommendations  Other (comment)    Recommendations for Other Services Rehab consult    Precautions / Restrictions Precautions Precautions: Fall;Other (comment);Cervical Precaution Booklet Issued: No Precaution Comments: verbally review cervical precautions, L sided spasticity Required Braces or Orthoses: Cervical Brace Cervical Brace: Soft collar       Mobility Bed Mobility                  Transfers                      Balance                                           ADL either performed or assessed with clinical judgement   ADL Overall ADL's : Needs assistance/impaired Eating/Feeding: Maximal assistance;Bed level Eating/Feeding Details (indicate cue type and reason): Pt provided with foam built up handles and instructed how to use.  also provided with lidded mug and was able to drink from it with min A using bil. hands                                          Vision       Perception     Praxis      Cognition Arousal/Alertness: Awake/alert Behavior During Therapy: Flat affect;WFL for tasks assessed/performed Overall Cognitive Status: Within Functional  Limits for tasks assessed                                          Exercises Exercises: Other exercises Other Exercises Other Exercises: active composite finger flexion and extension x 15 each UE actively with encouragement  Other Exercises: worked on reach of liquid soap container, grasp and release all planes each UE     Shoulder Instructions       General Comments      Pertinent Vitals/ Pain       Pain Assessment: Faces Faces Pain Scale: Hurts a little bit Pain Location: head Pain Descriptors / Indicators: Aching Pain Intervention(s): Monitored during session  Home Living                                          Prior Functioning/Environment              Frequency  Min 2X/week  Progress Toward Goals  OT Goals(current goals can now be found in the care plan section)  Progress towards OT goals: Progressing toward goals     Plan Discharge plan remains appropriate    Co-evaluation                 AM-PAC OT "6 Clicks" Daily Activity     Outcome Measure   Help from another person eating meals?: A Lot Help from another person taking care of personal grooming?: A Lot Help from another person toileting, which includes using toliet, bedpan, or urinal?: Total Help from another person bathing (including washing, rinsing, drying)?: Total Help from another person to put on and taking off regular upper body clothing?: Total Help from another person to put on and taking off regular lower body clothing?: Total 6 Click Score: 8    End of Session Equipment Utilized During Treatment: Cervical collar;Other (comment)  OT Visit Diagnosis: Unsteadiness on feet (R26.81);Other abnormalities of gait and mobility (R26.89);Muscle weakness (generalized) (M62.81);Other symptoms and signs involving the nervous system (R29.898)   Activity Tolerance Patient tolerated treatment well   Patient Left in bed;with call bell/phone within  reach;with bed alarm set;with family/visitor present   Nurse Communication          Time: 1025-8527 OT Time Calculation (min): 33 min  Charges: OT General Charges $OT Visit: 1 Visit OT Treatments $Self Care/Home Management : 8-22 mins $Neuromuscular Re-education: 8-22 mins  Eber Jones., OTR/L Acute Rehabilitation Services Pager [phone] Office [phone]    Jeani Hawking M 08/30/2019, 5:02 PM

## 2019-08-30 NOTE — Progress Notes (Signed)
Physical Therapy Treatment Patient Details Name: Haley Robinson MRN: [id number] DOB: 12/27/1940 Today's Date: 08/30/2019    History of Present Illness Pt is a 79 y.o. F with a significant PMH of HTN who presents with marked cervical spondyosis who presents s/p C4-7 anterior cervical discectomy with interbody fusion. Now with significant worsening of her quadriparesis posteroperatively.    PT Comments    Pt tolerates treatment well although expressing frustration with weakness and dependence in mobility at this time. Pt with significant weakness in BLE and distal UE. Pt has significant spasticity in LLE, with intermittent extension or flexion tone, making safe mobility more challenging at this time. PT encourages the pt to participate in LE exercise to improve neuromuscular function. Pt will continue to benefit from acute PT POC to improve mobility quality and to reduce falls risk. PT continues to recommend CIR at this time as the pt demonstrates the potential to make significant functional gains with continued high intensity therapies.   Follow Up Recommendations  CIR     Equipment Recommendations  Wheelchair (measurements PT);Wheelchair cushion (measurements PT);Hospital bed (mechanical lift if home today)    Recommendations for Other Services       Precautions / Restrictions Precautions Precautions: Fall;Other (comment);Cervical Precaution Booklet Issued: No Precaution Comments: verbally review cervical precautions, L sided spasticity Required Braces or Orthoses: Cervical Brace Cervical Brace: Soft collar Restrictions Weight Bearing Restrictions: No    Mobility  Bed Mobility Overal bed mobility: Needs Assistance Bed Mobility: Rolling;Sidelying to Sit;Sit to Sidelying Rolling: Max assist Sidelying to sit: Max assist     Sit to sidelying: Total assist    Transfers Overall transfer level: Needs assistance Equipment used: None Transfers: Lateral/Scoot Transfers Sit to  Stand: Total assist        Lateral/Scoot Transfers: Total assist (unable to safely transfer due to LE spasticity) General transfer comment: PT providing R knee block, pt with increased hip extensor tone and intermittently with L knee flexor tone. Unpredicatbility of tone creating a safety risk for transfer as pt sliding forward toward edge of bed at times but later knee flexing to bend foot under bed. Pt assisted back safely to bed  Ambulation/Gait                 Stairs             Wheelchair Mobility    Modified Rankin (Stroke Patients Only)       Balance Overall balance assessment: Needs assistance Sitting-balance support: Bilateral upper extremity supported Sitting balance-Leahy Scale: Zero Sitting balance - Comments: brief periods of minA with maxA for a majority of session due to trunk weakness   Standing balance support: Bilateral upper extremity supported Standing balance-Leahy Scale: Zero Standing balance comment: totalA                            Cognition Arousal/Alertness: Awake/alert Behavior During Therapy: Anxious Overall Cognitive Status: Within Functional Limits for tasks assessed                                        Exercises      General Comments General comments (skin integrity, edema, etc.): VSS on RA      Pertinent Vitals/Pain Pain Assessment: Faces Faces Pain Scale: Hurts even more Pain Location: head Pain Descriptors / Indicators: Aching Pain Intervention(s): Monitored during session  Home Living                      Prior Function            PT Goals (current goals can now be found in the care plan section) Acute Rehab PT Goals Patient Stated Goal: be able to walk, be more independent Progress towards PT goals: Progressing toward goals    Frequency    Min 5X/week      PT Plan Current plan remains appropriate    Co-evaluation              AM-PAC PT "6 Clicks"  Mobility   Outcome Measure  Help needed turning from your back to your side while in a flat bed without using bedrails?: Total Help needed moving from lying on your back to sitting on the side of a flat bed without using bedrails?: Total Help needed moving to and from a bed to a chair (including a wheelchair)?: Total Help needed standing up from a chair using your arms (e.g., wheelchair or bedside chair)?: Total Help needed to walk in hospital room?: Total Help needed climbing 3-5 steps with a railing? : Total 6 Click Score: 6    End of Session Equipment Utilized During Treatment: Cervical collar Activity Tolerance: Patient tolerated treatment well Patient left: in bed;with call bell/phone within reach;with bed alarm set;with family/visitor present Nurse Communication: Mobility status PT Visit Diagnosis: Other abnormalities of gait and mobility (R26.89)     Time: 6712-4580 PT Time Calculation (min) (ACUTE ONLY): 44 min  Charges:  $Therapeutic Activity: 38-52 mins                     Arlyss Gandy, PT, DPT Acute Rehabilitation Pager: [phone]    Arlyss Gandy 08/30/2019, 10:50 AM

## 2019-08-30 NOTE — Progress Notes (Signed)
Postop day 2.  Patient obviously upset and discouraged by her worsening after surgery.  Patient is in no pain.  She continues to complain of numbness in her extremities.  She is afebrile.  Her vital signs are stable.  Her urine output is good.  She is awake and alert.  She is oriented and appropriate.  Motor examination with some slight improvement today.  She has good deltoid and biceps strength bilaterally.  Wrist extensor strength is close to normal bilaterally.  Her triceps muscle group strength is much improved and is now 3 to 4-/5 bilaterally.  She has a little bit of grip function and some intrinsic function in her left hand she has minimal grip function in her right hand.  Lower extremity function still with 4+/5 left lower extremity strength.  She has some minimal voluntary movement with some spasms in her right lower extremity.  Her wound is clean dry and intact.  Her neck is soft.  Chest and abdomen are benign.  Patient with severe preoperative myelopathy with significant postoperative worsening after 3 level anterior cervical decompression and fusion.  Continue rehab efforts and gentle mobilization.  Patient will almost certainly benefit from inpatient rehabilitation as she slowly improves.

## 2019-08-31 MED ORDER — DEXAMETHASONE 4 MG PO TABS
2.0000 mg | ORAL_TABLET | Freq: Two times a day (BID) | ORAL | Status: DC
  Administered 2019-08-31 – 2019-09-02 (×4): 2 mg via ORAL
  Filled 2019-08-31 (×4): qty 1

## 2019-08-31 NOTE — Progress Notes (Signed)
Physical Therapy Treatment Patient Details Name: Haley Robinson MRN: [id number] DOB: 22-Nov-1940 Today's Date: 08/31/2019    History of Present Illness Pt is a 79 y.o. F with a significant PMH of HTN who presents with marked cervical spondyosis who presents s/p C4-7 anterior cervical discectomy with interbody fusion. Now with significant worsening of her quadriparesis posteroperatively.    PT Comments    Pt tolerated treatment well with improved function in UE and improved ability to correct balance deviations. Pt with improved activation of LLE to assist in lateral scooting and power up some into standing. PT also noted some increased RLE extensor tone which improves posture during standing attempts. PT provides education on use of UEs for momentum to improve independence in rolling and discusses the need for a turn schedule for this patient with nursing to reduce risk of sacral ulcer. PT continues to recommend CIR at this time as the pt demonstrates the potential to make significant functional gains with high intensity inpatient therapies.  Follow Up Recommendations  CIR     Equipment Recommendations  Wheelchair (measurements PT);Wheelchair cushion (measurements PT);Hospital bed    Recommendations for Other Services       Precautions / Restrictions Precautions Precautions: Fall;Cervical Precaution Booklet Issued: No Precaution Comments: verbally review cervical precautions, L sided spasticity Required Braces or Orthoses: Cervical Brace Cervical Brace: Soft collar Restrictions Weight Bearing Restrictions: No    Mobility  Bed Mobility Overal bed mobility: Needs Assistance Bed Mobility: Rolling;Sit to Sidelying Rolling: Min assist;Max assist (minA to roll right, maxA to roll left)   Supine to sit: Max assist   Sit to sidelying: Max assist General bed mobility comments: pt requires bed pad to scoot to EOB and max (A) to elevate trunk from bed surface. pt unable to sustain  static sitting. pt prop sitting EOB with cues for chest elevation to cause scapula depression. pt with anterior and posterior LOB. pt is able to sustain static sitting for 15 seconds  Transfers Overall transfer level: Needs assistance Equipment used: 1 person hand held assist Transfers: Sit to/from Stand;Lateral/Scoot Transfers Sit to Stand: Total assist (pt is able to activate LLE some and provides some pull w/ UE)        Lateral/Scoot Transfers: Total assist General transfer comment: pt pulls with UEs and activates with LLE some to push up and offload bottom but still requires totalA, performing <25% of the effort to transfer. Pt stands 3 times  Ambulation/Gait                 Stairs             Wheelchair Mobility    Modified Rankin (Stroke Patients Only)       Balance Overall balance assessment: Needs assistance Sitting-balance support: Bilateral upper extremity supported;Feet supported Sitting balance-Leahy Scale: Poor Sitting balance - Comments: maxA, pt using UEs to push Postural control: Posterior lean;Left lateral lean;Right lateral lean Standing balance support: Bilateral upper extremity supported Standing balance-Leahy Scale: Zero Standing balance comment: totalA                            Cognition Arousal/Alertness: Awake/alert Behavior During Therapy: WFL for tasks assessed/performed Overall Cognitive Status: Within Functional Limits for tasks assessed  Exercises Other Exercises Other Exercises: wrist extension, bicep/ tricep activation, pincher grasp, reaching for objects with thumb up to activate digit extension with decreased 4th/ 5th digit Other Exercises: L LE knee extension    General Comments General comments (skin integrity, edema, etc.): soft collar in place, VSS      Pertinent Vitals/Pain Pain Assessment: Faces Faces Pain Scale: Hurts even more Pain Location:  buttocks Pain Descriptors / Indicators: Aching Pain Intervention(s): Monitored during session    Home Living                      Prior Function            PT Goals (current goals can now be found in the care plan section) Acute Rehab PT Goals Patient Stated Goal: to return to walking Progress towards PT goals: Progressing toward goals    Frequency    Min 5X/week      PT Plan Current plan remains appropriate    Co-evaluation              AM-PAC PT "6 Clicks" Mobility   Outcome Measure  Help needed turning from your back to your side while in a flat bed without using bedrails?: Total Help needed moving from lying on your back to sitting on the side of a flat bed without using bedrails?: Total Help needed moving to and from a bed to a chair (including a wheelchair)?: Total Help needed standing up from a chair using your arms (e.g., wheelchair or bedside chair)?: Total Help needed to walk in hospital room?: Total Help needed climbing 3-5 steps with a railing? : Total 6 Click Score: 6    End of Session Equipment Utilized During Treatment: Cervical collar Activity Tolerance: Patient tolerated treatment well Patient left: in bed;with call bell/phone within reach;with bed alarm set;with family/visitor present Nurse Communication: Mobility status PT Visit Diagnosis: Other abnormalities of gait and mobility (R26.89)     Time: 1884-1660 PT Time Calculation (min) (ACUTE ONLY): 38 min  Charges:  $Therapeutic Activity: 38-52 mins                     Arlyss Gandy, PT, DPT Acute Rehabilitation Pager: [phone]    Arlyss Gandy 08/31/2019, 2:27 PM

## 2019-08-31 NOTE — Progress Notes (Signed)
Occupational Therapy Treatment Patient Details Name: Haley Robinson MRN: [id number] DOB: 05/15/1940 Today's Date: 08/31/2019    History of present illness Pt is a 79 y.o. F with a significant PMH of HTN who presents with marked cervical spondyosis who presents s/p C4-7 anterior cervical discectomy with interbody fusion. Now with significant worsening of her quadriparesis posteroperatively.   OT comments  Pt able to activate bil UE with ataxic movement and decreased fine motor skills. Pt with no activation of R LE this session and demonstrated extensor tone with tactile input when supine initially. Pt requires total (A) with lateral scoot L into chair. Pt educated the goals for therapy at this time will be for basic transfer to wheelchair and static sitting balance. Recommendation for CIR remains appropriate at this time.   Follow Up Recommendations  CIR    Equipment Recommendations  Other (comment)    Recommendations for Other Services Rehab consult    Precautions / Restrictions Precautions Precautions: Fall;Cervical Cervical Brace: Soft collar       Mobility Bed Mobility Overal bed mobility: Needs Assistance Bed Mobility: Supine to Sit     Supine to sit: Max assist     General bed mobility comments: pt requires bed pad to scoot to EOB and max (A) to elevate trunk from bed surface. pt unable to sustain static sitting. pt prop sitting EOB with cues for chest elevation to cause scapula depression. pt with anterior and posterior LOB. pt is able to sustain static sitting for 15 seconds  Transfers Overall transfer level: Needs assistance   Transfers: Lateral/Scoot Transfers          Lateral/Scoot Transfers: Total assist General transfer comment: pt transferring L side with pad R LE blocked, L LE repositioned multiple times for lateral transfer    Balance Overall balance assessment: Needs assistance Sitting-balance support: Bilateral upper extremity supported;Feet  supported Sitting balance-Leahy Scale: Zero                                     ADL either performed or assessed with clinical judgement   ADL Overall ADL's : Needs assistance/impaired Eating/Feeding: Minimal assistance;Bed level;With adaptive utensils Eating/Feeding Details (indicate cue type and reason): pt using bil UE on standard cup drink with straw. pt reports ability to use red foam utensil with breakfast. daughter present to confirm   Grooming Details (indicate cue type and reason): decrease coordinated movement to reach nose                               General ADL Comments: session focused on eob static sitting balance.      Vision       Perception     Praxis      Cognition Arousal/Alertness: Awake/alert Behavior During Therapy: WFL for tasks assessed/performed Overall Cognitive Status: Within Functional Limits for tasks assessed                                          Exercises Exercises: Other exercises Other Exercises Other Exercises: wrist extension, bicep/ tricep activation, pincher grasp, reaching for objects with thumb up to activate digit extension with decreased 4th/ 5th digit Other Exercises: L LE knee extension   Shoulder Instructions       General  Comments soft collar in place, daughter present, VSS    Pertinent Vitals/ Pain       Pain Assessment: No/denies pain  Home Living                                          Prior Functioning/Environment              Frequency  Min 2X/week        Progress Toward Goals  OT Goals(current goals can now be found in the care plan section)  Progress towards OT goals: Progressing toward goals  Acute Rehab OT Goals Patient Stated Goal: to walk in 14 days OT Goal Formulation: With patient Time For Goal Achievement: 09/12/19 Potential to Achieve Goals: Good ADL Goals Pt Will Perform Eating: with set-up;with adaptive  utensils;sitting Pt Will Perform Grooming: with set-up;with adaptive equipment;sitting Pt Will Perform Upper Body Dressing: with min assist;sitting Pt Will Perform Lower Body Dressing: with mod assist;bed level Pt Will Transfer to Toilet: with mod assist;with transfer board;bedside commode Additional ADL Goal #1: Pt will maintain sitting balance EOB for 5-7 minutes at min guard level in preparation for OOB BADLs  Plan Discharge plan remains appropriate    Co-evaluation                 AM-PAC OT "6 Clicks" Daily Activity     Outcome Measure   Help from another person eating meals?: A Lot Help from another person taking care of personal grooming?: A Lot Help from another person toileting, which includes using toliet, bedpan, or urinal?: Total Help from another person bathing (including washing, rinsing, drying)?: Total Help from another person to put on and taking off regular upper body clothing?: Total Help from another person to put on and taking off regular lower body clothing?: Total 6 Click Score: 8    End of Session Equipment Utilized During Treatment: Cervical collar  OT Visit Diagnosis: Unsteadiness on feet (R26.81);Other abnormalities of gait and mobility (R26.89);Muscle weakness (generalized) (M62.81);Other symptoms and signs involving the nervous system (R29.898)   Activity Tolerance Patient tolerated treatment well   Patient Left in chair;with call bell/phone within reach;with family/visitor present   Nurse Communication Mobility status;Precautions        Time: 1010 (1245)-8099 OT Time Calculation (min): 46 min  Charges: OT General Charges $OT Visit: 1 Visit OT Treatments $Therapeutic Activity: 23-37 mins $Therapeutic Exercise: 8-22 mins   Brynn, OTR/L  Acute Rehabilitation Services Pager: [phone] Office: [phone] .    Mateo Flow 08/31/2019, 11:42 AM

## 2019-08-31 NOTE — Progress Notes (Signed)
Inpatient Rehabilitation-Admissions [employer] authorization process initiated. Anticipate determination early next week. Will update once there has been a determination.   Cheri Rous, OTR/L  Rehab Admissions Coordinator  [phone] 08/31/2019 12:32 PM

## 2019-08-31 NOTE — Progress Notes (Signed)
Postop day 3.  Patient continues to slowly improve.  No significant neck or posterior spinal pain.  Patient reports some improved function with her left hand.  Was able to feed herself today.  She is afebrile.  Her vital signs are stable.  She is awake and alert.  Motor examination with improved grip and intrinsic function in her left hand.  Slightly improved grip function of the right hand but still without significant intrinsic function right hand.  Lower extremity strength stable.  Left lower extremity 4+/5.  Right lower extremity with some minimal voluntary movement and some intermittent spastic movements.  Wound clean and dry.  Chest and abdomen.  Overall stable.  Continue rehab efforts.  Likely admission inpatient rehab early next week.

## 2019-09-01 NOTE — Progress Notes (Signed)
No new issues or problems.  Patient without pain.  Still with numbness in both hands and both lower extremities.  Still with weakness in her hands and her lower extremities right much worse than left.  Spirits seem somewhat better today.  Afebrile.  Vital signs are stable.  Neurologic exam unchanged.  Still with some minimal grip on the right side without hand intrinsic function.  Still with moderate grip strength and some intrinsic function on the left hand.  Good strength in her left lower extremity minimal strength in her right lower extremity.  Wound clean and dry.  Chest and abdomen benign.  Continue efforts at mobilization.  Awaiting rehab placement.

## 2019-09-01 NOTE — Plan of Care (Signed)
  Problem: Education: Goal: Knowledge of General Education information will improve Description: Including pain rating scale, medication(s)/side effects and non-pharmacologic comfort measures Outcome: Progressing   Problem: Health Behavior/Discharge Planning: Goal: Ability to manage health-related needs will improve Outcome: Progressing   Problem: Clinical Measurements: Goal: Ability to maintain clinical measurements within normal limits will improve Outcome: Progressing   Problem: Activity: Goal: Risk for activity intolerance will decrease Outcome: Progressing   Problem: Nutrition: Goal: Adequate nutrition will be maintained Outcome: Progressing   Problem: Elimination: Goal: Will not experience complications related to bowel motility Outcome: Progressing   Problem: Pain Managment: Goal: General experience of comfort will improve Outcome: Progressing   Problem: Safety: Goal: Ability to remain free from injury will improve Outcome: Progressing   Problem: Skin Integrity: Goal: Risk for impaired skin integrity will decrease Outcome: Progressing   Problem: Education: Goal: Ability to verbalize activity precautions or restrictions will improve Outcome: Progressing Goal: Knowledge of the prescribed therapeutic regimen will improve Outcome: Progressing Goal: Understanding of discharge needs will improve Outcome: Progressing   Problem: Activity: Goal: Will remain free from falls Outcome: Progressing   Problem: Bowel/Gastric: Goal: Gastrointestinal status for postoperative course will improve Outcome: Progressing   Problem: Clinical Measurements: Goal: Postoperative complications will be avoided or minimized Outcome: Progressing   Problem: Pain Management: Goal: Pain level will decrease Outcome: Progressing   Problem: Skin Integrity: Goal: Will show signs of wound healing Outcome: Progressing   Problem: Health Behavior/Discharge Planning: Goal: Identification  of resources available to assist in meeting health care needs will improve Outcome: Progressing   Problem: Bladder/Genitourinary: Goal: Urinary functional status for postoperative course will improve Outcome: Progressing   Problem: Activity: Goal: Ability to avoid complications of mobility impairment will improve Outcome: Not Progressing

## 2019-09-01 NOTE — Progress Notes (Signed)
Physical Therapy Treatment Patient Details Name: Haley Robinson MRN: [id number] DOB: 1940-09-02 Today's Date: 09/01/2019    History of Present Illness Pt is a 79 y.o. F with a significant PMH of HTN who presents with marked cervical spondyosis who presents s/p C4-7 anterior cervical discectomy with interbody fusion. Now with significant worsening of her quadriparesis posteroperatively.    PT Comments    Pt outwardly frustrated with her perceived lack of progress.  Emphasis on therapeutic exercise with stress on activation and control, transition to EOB, sitting balance/spinal extension , sit to stand and pivot transfer.   Follow Up Recommendations  CIR     Equipment Recommendations  Wheelchair (measurements PT);Wheelchair cushion (measurements PT);Other (comment) (TBD)    Recommendations for Other Services       Precautions / Restrictions Precautions Precautions: Fall Required Braces or Orthoses: Cervical Brace Cervical Brace: Soft collar    Mobility  Bed Mobility Overal bed mobility: Needs Assistance Bed Mobility: Rolling;Sidelying to Sit Rolling: Mod assist Sidelying to sit: Max assist Supine to sit: +2 for safety/equipment     General bed mobility comments: Without using hook method, had pt reach from left to right and try coming into hip flexion with LE, but pt moved more into extension without moderate assist  Transfers Overall transfer level: Needs assistance   Transfers: Sit to/from Stand;Squat Pivot Transfers Sit to Stand: Total assist   Squat pivot transfers: Total assist     General transfer comment: cues to push up then "bear hug".  2 trials of standing, 1 blocking L knee and 1 trial blocking R knee.  pt attained upright stance x2, but much more control blocking R knee.  Ambulation/Gait             General Gait Details: Unable   Stairs             Wheelchair Mobility    Modified Rankin (Stroke Patients Only)       Balance  Overall balance assessment: Needs assistance Sitting-balance support: Bilateral upper extremity supported;Feet supported Sitting balance-Leahy Scale: Poor Sitting balance - Comments: max assist generally, working on upright sitting with truncal extension optimizing position of UE's.     Standing balance-Leahy Scale: Zero Standing balance comment: totalA x2 trials attaining full knee extension and standing for 10-15 secs each .                            Cognition Arousal/Alertness: Awake/alert Behavior During Therapy: WFL for tasks assessed/performed Overall Cognitive Status: Within Functional Limits for tasks assessed                                        Exercises Other Exercises Other Exercises: hip/knee flex/ext AA/PROM x10  bil, bicep/tricep press with shoulder isolated out of the exercise.    General Comments General comments (skin integrity, edema, etc.): vss      Pertinent Vitals/Pain Pain Assessment: Faces Faces Pain Scale: Hurts even more Pain Location: general  Pain Descriptors / Indicators: Tingling;Sore Pain Intervention(s): Monitored during session    Home Living                      Prior Function            PT Goals (current goals can now be found in the care plan section) Acute Rehab PT Goals  Patient Stated Goal: to return to walking PT Goal Formulation: With patient Time For Goal Achievement: 09/12/19 Potential to Achieve Goals: Good Progress towards PT goals: Progressing toward goals    Frequency    Min 5X/week      PT Plan Current plan remains appropriate    Co-evaluation              AM-PAC PT "6 Clicks" Mobility   Outcome Measure  Help needed turning from your back to your side while in a flat bed without using bedrails?: Total Help needed moving from lying on your back to sitting on the side of a flat bed without using bedrails?: Total Help needed moving to and from a bed to a chair  (including a wheelchair)?: Total Help needed standing up from a chair using your arms (e.g., wheelchair or bedside chair)?: Total Help needed to walk in hospital room?: Total Help needed climbing 3-5 steps with a railing? : Total 6 Click Score: 6    End of Session Equipment Utilized During Treatment: Cervical collar Activity Tolerance: Patient tolerated treatment well Patient left: in chair;with call bell/phone within reach;with chair alarm set;with family/visitor present Nurse Communication: Mobility status PT Visit Diagnosis: Other abnormalities of gait and mobility (R26.89);Other symptoms and signs involving the nervous system (R29.898)     Time: 8916-9450 PT Time Calculation (min) (ACUTE ONLY): 34 min  Charges:  $Therapeutic Exercise: 8-22 mins $Therapeutic Activity: 8-22 mins                     09/01/2019  Jacinto Halim., PT Acute Rehabilitation Services [phone]  (pager) [phone]  (office)   Haley Robinson 09/01/2019, 1:45 PM

## 2019-09-02 MED ORDER — DEXAMETHASONE 0.5 MG PO TABS
1.0000 mg | ORAL_TABLET | Freq: Two times a day (BID) | ORAL | Status: DC
  Administered 2019-09-02 – 2019-09-03 (×2): 1 mg via ORAL
  Filled 2019-09-02 (×4): qty 2

## 2019-09-02 NOTE — Progress Notes (Signed)
Postop day 5.  Patient without any pain.  States that her main problem is inability to move herself around while sitting.  She continues to regain function in both hands.  She is able to do some fine motor task with her left hand.  Right lower extremity continues to minimally improve.  Patient overall progressing slowly.  She is certainly making significant improvement with some functional gains over the past few days.  Continue efforts at therapy and rehabilitation.

## 2019-09-03 ENCOUNTER — Inpatient Hospital Stay (HOSPITAL_COMMUNITY)
Admission: RE | Admit: 2019-09-03 | Discharge: 2019-10-03 | DRG: 559 | Disposition: A | Discharge status: Home Health | Payer: Medicare PPO | Source: Intra-hospital | Attending: Physical Medicine and Rehabilitation | Admitting: Physical Medicine and Rehabilitation

## 2019-09-03 ENCOUNTER — Encounter (HOSPITAL_COMMUNITY): Payer: Self-pay | Admitting: Physical Medicine and Rehabilitation

## 2019-09-03 ENCOUNTER — Other Ambulatory Visit: Payer: Self-pay

## 2019-09-03 ENCOUNTER — Inpatient Hospital Stay (HOSPITAL_COMMUNITY): Payer: Medicare PPO

## 2019-09-03 DIAGNOSIS — Z4789 Encounter for other orthopedic aftercare: Secondary | ICD-10-CM | POA: Diagnosis present

## 2019-09-03 DIAGNOSIS — K592 Neurogenic bowel, not elsewhere classified: Secondary | ICD-10-CM | POA: Diagnosis present

## 2019-09-03 DIAGNOSIS — G959 Disease of spinal cord, unspecified: Secondary | ICD-10-CM | POA: Diagnosis not present

## 2019-09-03 DIAGNOSIS — F40240 Claustrophobia: Secondary | ICD-10-CM | POA: Diagnosis present

## 2019-09-03 DIAGNOSIS — M7989 Other specified soft tissue disorders: Secondary | ICD-10-CM | POA: Diagnosis not present

## 2019-09-03 DIAGNOSIS — E785 Hyperlipidemia, unspecified: Secondary | ICD-10-CM | POA: Diagnosis present

## 2019-09-03 DIAGNOSIS — G4700 Insomnia, unspecified: Secondary | ICD-10-CM | POA: Diagnosis present

## 2019-09-03 DIAGNOSIS — Z90710 Acquired absence of both cervix and uterus: Secondary | ICD-10-CM

## 2019-09-03 DIAGNOSIS — Z23 Encounter for immunization: Secondary | ICD-10-CM | POA: Diagnosis not present

## 2019-09-03 DIAGNOSIS — R001 Bradycardia, unspecified: Secondary | ICD-10-CM | POA: Diagnosis present

## 2019-09-03 DIAGNOSIS — R42 Dizziness and giddiness: Secondary | ICD-10-CM | POA: Diagnosis present

## 2019-09-03 DIAGNOSIS — K5900 Constipation, unspecified: Secondary | ICD-10-CM | POA: Diagnosis present

## 2019-09-03 DIAGNOSIS — N390 Urinary tract infection, site not specified: Secondary | ICD-10-CM | POA: Diagnosis not present

## 2019-09-03 DIAGNOSIS — B9620 Unspecified Escherichia coli [E. coli] as the cause of diseases classified elsewhere: Secondary | ICD-10-CM | POA: Diagnosis not present

## 2019-09-03 DIAGNOSIS — R5381 Other malaise: Secondary | ICD-10-CM | POA: Diagnosis present

## 2019-09-03 DIAGNOSIS — L8990 Pressure ulcer of unspecified site, unspecified stage: Secondary | ICD-10-CM | POA: Insufficient documentation

## 2019-09-03 DIAGNOSIS — I82441 Acute embolism and thrombosis of right tibial vein: Secondary | ICD-10-CM | POA: Diagnosis present

## 2019-09-03 DIAGNOSIS — N319 Neuromuscular dysfunction of bladder, unspecified: Secondary | ICD-10-CM | POA: Diagnosis present

## 2019-09-03 DIAGNOSIS — L821 Other seborrheic keratosis: Secondary | ICD-10-CM | POA: Diagnosis present

## 2019-09-03 DIAGNOSIS — F17210 Nicotine dependence, cigarettes, uncomplicated: Secondary | ICD-10-CM | POA: Diagnosis present

## 2019-09-03 DIAGNOSIS — M4712 Other spondylosis with myelopathy, cervical region: Secondary | ICD-10-CM | POA: Diagnosis present

## 2019-09-03 DIAGNOSIS — D72829 Elevated white blood cell count, unspecified: Secondary | ICD-10-CM | POA: Diagnosis present

## 2019-09-03 DIAGNOSIS — I10 Essential (primary) hypertension: Secondary | ICD-10-CM | POA: Diagnosis present

## 2019-09-03 DIAGNOSIS — G8250 Quadriplegia, unspecified: Secondary | ICD-10-CM | POA: Diagnosis present

## 2019-09-03 DIAGNOSIS — I824Z2 Acute embolism and thrombosis of unspecified deep veins of left distal lower extremity: Secondary | ICD-10-CM | POA: Diagnosis present

## 2019-09-03 DIAGNOSIS — I82409 Acute embolism and thrombosis of unspecified deep veins of unspecified lower extremity: Secondary | ICD-10-CM

## 2019-09-03 DIAGNOSIS — L89152 Pressure ulcer of sacral region, stage 2: Secondary | ICD-10-CM | POA: Diagnosis present

## 2019-09-03 MED ORDER — HYDROCODONE-ACETAMINOPHEN 10-325 MG PO TABS
1.0000 | ORAL_TABLET | ORAL | Status: DC | PRN
  Filled 2019-09-03: qty 1

## 2019-09-03 MED ORDER — ASCORBIC ACID 500 MG PO TABS
500.0000 mg | ORAL_TABLET | Freq: Two times a day (BID) | ORAL | Status: DC
  Administered 2019-09-03 – 2019-10-03 (×60): 500 mg via ORAL
  Filled 2019-09-03 (×59): qty 1

## 2019-09-03 MED ORDER — HYDROCHLOROTHIAZIDE 25 MG PO TABS
12.5000 mg | ORAL_TABLET | Freq: Every day | ORAL | Status: DC
  Administered 2019-09-04 – 2019-09-15 (×12): 12.5 mg via ORAL
  Filled 2019-09-03 (×12): qty 1

## 2019-09-03 MED ORDER — DEXAMETHASONE 0.5 MG PO TABS
1.0000 mg | ORAL_TABLET | Freq: Two times a day (BID) | ORAL | Status: AC
  Administered 2019-09-03 – 2019-09-08 (×10): 1 mg via ORAL
  Filled 2019-09-03 (×10): qty 2

## 2019-09-03 MED ORDER — PHENOL 1.4 % MT LIQD: 1.0000 | OROMUCOSAL | Status: DC | PRN

## 2019-09-03 MED ORDER — PROCHLORPERAZINE MALEATE 5 MG PO TABS
5.0000 mg | ORAL_TABLET | Freq: Four times a day (QID) | ORAL | Status: DC | PRN
  Administered 2019-09-17: 5 mg via ORAL
  Administered 2019-09-22 – 2019-09-30 (×4): 10 mg via ORAL
  Filled 2019-09-03 (×4): qty 2
  Filled 2019-09-03: qty 1

## 2019-09-03 MED ORDER — HYDROCODONE-ACETAMINOPHEN 10-325 MG PO TABS: 1.0000 | ORAL_TABLET | ORAL | 0 refills | Status: DC | PRN

## 2019-09-03 MED ORDER — MILK AND MOLASSES ENEMA
1.0000 | Freq: Once | RECTAL | Status: AC
  Administered 2019-09-03: 240 mL via RECTAL
  Filled 2019-09-03: qty 240

## 2019-09-03 MED ORDER — PROCHLORPERAZINE EDISYLATE 10 MG/2ML IJ SOLN: 5.0000 mg | Freq: Four times a day (QID) | INTRAMUSCULAR | Status: DC | PRN

## 2019-09-03 MED ORDER — POLYETHYLENE GLYCOL 3350 17 G PO PACK
17.0000 g | PACK | Freq: Every day | ORAL | Status: DC
  Administered 2019-09-03 – 2019-09-04 (×2): 17 g via ORAL
  Filled 2019-09-03: qty 1

## 2019-09-03 MED ORDER — BISACODYL 10 MG RE SUPP
10.0000 mg | Freq: Every day | RECTAL | Status: DC | PRN
  Administered 2019-09-06: 10 mg via RECTAL
  Filled 2019-09-03 (×3): qty 1

## 2019-09-03 MED ORDER — DEXAMETHASONE 1 MG PO TABS: 1.0000 mg | ORAL_TABLET | Freq: Two times a day (BID) | ORAL | 0 refills | Status: DC

## 2019-09-03 MED ORDER — TRAMADOL HCL 50 MG PO TABS
50.0000 mg | ORAL_TABLET | Freq: Four times a day (QID) | ORAL | Status: DC | PRN
  Administered 2019-09-05 – 2019-09-30 (×15): 50 mg via ORAL
  Filled 2019-09-03 (×16): qty 1

## 2019-09-03 MED ORDER — NICOTINE POLACRILEX 2 MG MT GUM
2.0000 mg | CHEWING_GUM | OROMUCOSAL | Status: DC | PRN
  Filled 2019-09-03: qty 1

## 2019-09-03 MED ORDER — PROCHLORPERAZINE 25 MG RE SUPP: 12.5000 mg | Freq: Four times a day (QID) | RECTAL | Status: DC | PRN

## 2019-09-03 MED ORDER — DIPHENHYDRAMINE HCL 12.5 MG/5ML PO ELIX: 12.5000 mg | ORAL_SOLUTION | Freq: Four times a day (QID) | ORAL | Status: DC | PRN

## 2019-09-03 MED ORDER — SIMVASTATIN 20 MG PO TABS
40.0000 mg | ORAL_TABLET | Freq: Every day | ORAL | Status: DC
  Administered 2019-09-04 – 2019-10-02 (×29): 40 mg via ORAL
  Filled 2019-09-03 (×30): qty 2

## 2019-09-03 MED ORDER — MELATONIN 5 MG PO TABS
5.0000 mg | ORAL_TABLET | Freq: Every evening | ORAL | Status: DC | PRN
  Administered 2019-09-04 – 2019-09-30 (×11): 5 mg via ORAL
  Filled 2019-09-03 (×12): qty 1

## 2019-09-03 MED ORDER — POLYETHYLENE GLYCOL 3350 17 G PO PACK
17.0000 g | PACK | Freq: Every day | ORAL | Status: DC | PRN
  Filled 2019-09-03: qty 1

## 2019-09-03 MED ORDER — LIDOCAINE HCL URETHRAL/MUCOSAL 2 % EX GEL: 1.0000 "application " | CUTANEOUS | Status: DC | PRN

## 2019-09-03 MED ORDER — FLEET ENEMA 7-19 GM/118ML RE ENEM: 1.0000 | ENEMA | Freq: Once | RECTAL | Status: DC | PRN

## 2019-09-03 MED ORDER — ACETAMINOPHEN 325 MG PO TABS
325.0000 mg | ORAL_TABLET | ORAL | Status: DC | PRN
  Administered 2019-09-04 – 2019-10-01 (×5): 650 mg via ORAL
  Filled 2019-09-03 (×5): qty 2

## 2019-09-03 MED ORDER — GUAIFENESIN-DM 100-10 MG/5ML PO SYRP: 5.0000 mL | ORAL_SOLUTION | Freq: Four times a day (QID) | ORAL | Status: DC | PRN

## 2019-09-03 MED ORDER — CYCLOBENZAPRINE HCL 10 MG PO TABS: 10.0000 mg | ORAL_TABLET | Freq: Three times a day (TID) | ORAL | 0 refills | Status: DC | PRN

## 2019-09-03 MED ORDER — CYCLOBENZAPRINE HCL 10 MG PO TABS
10.0000 mg | ORAL_TABLET | Freq: Three times a day (TID) | ORAL | Status: DC | PRN
  Administered 2019-09-05 – 2019-10-01 (×10): 10 mg via ORAL
  Filled 2019-09-03 (×11): qty 1

## 2019-09-03 MED ORDER — CHEWING GUM (ORBIT) SUGAR FREE
1.0000 | CHEWING_GUM | Freq: Four times a day (QID) | ORAL | Status: DC
  Administered 2019-09-03 – 2019-10-03 (×81): 1 via ORAL
  Filled 2019-09-03 (×5): qty 1

## 2019-09-03 MED ORDER — MENTHOL 3 MG MT LOZG: 1.0000 | LOZENGE | OROMUCOSAL | Status: DC | PRN

## 2019-09-03 MED ORDER — BOOST PLUS PO LIQD
237.0000 mL | Freq: Three times a day (TID) | ORAL | Status: DC
  Administered 2019-09-04 – 2019-10-03 (×63): 237 mL via ORAL
  Filled 2019-09-03 (×91): qty 237

## 2019-09-03 MED ORDER — ALUM & MAG HYDROXIDE-SIMETH 200-200-20 MG/5ML PO SUSP: 30.0000 mL | ORAL | Status: DC | PRN

## 2019-09-03 NOTE — Care Management Important Message (Signed)
Important Message  Patient Details  Name: Haley Robinson MRN: [id number] Date of Birth: 03-02-1940   Medicare Important Message Given:  Yes     Dorena Bodo 09/03/2019, 2:44 PM

## 2019-09-03 NOTE — Progress Notes (Signed)
IP rehab admissions:  I have insurance authorization for CIR admit for today.  I have notified MD and Meghan and have medical clearance to admit to CIR.  Bed available and will admit to CIR today.  I met with patient and her daughter, Haley Robinson, and they are in agreement to CIR admit today.  RN and unit case manager are aware of above.  Call me for questions. [phone]

## 2019-09-03 NOTE — Discharge Summary (Signed)
Physician Discharge Summary     Providing Compassionate, Quality Care - Together   Patient ID: Haley Robinson MRN: [id number] DOB/AGE: 77/02/1940 77 y.o.  Admit date: 08/28/2019 Discharge date: 09/03/2019  Admission Diagnoses: Cervical myelopathy  Discharge Diagnoses:  Active Problems:   Cervical myelopathy [facility])   Discharged Condition: good  Hospital Course: Patient was admitted to 4NP05 following C4-5, C5-6, C6-7 ACDF by Dr. Jordan Likes on 08/28/2019. The patient is tolerating food well. She is having no issues with swallowing or complaints of shortness of breath. Her pain is well-controlled with oral pain medication. PT and OT have worked with patient and are recommending discharge to inpatient rehab. She is ready for discharge to CIR for further rehabilitation before going home.    Consults: rehabilitation medicine  Significant Diagnostic Studies: radiology: DG Cervical Spine 2-3 Views  Result Date: 08/28/2019 CLINICAL DATA:  Provided history: Elective surgery. Additional provided: C4-7 ACDF, fluoroscopy time 5 seconds, 0.53 mGy EXAM: CERVICAL SPINE - 2-3 VIEW; DG C-ARM 1-60 MIN COMPARISON:  Cervical spine MRI 07/16/2019 FINDINGS: A single lateral view intraoperative fluoroscopic image of the cervical spine is submitted for evaluation. Partially visualized support tubes. ACDF hardware an interbody spacers are present at the C4-C7 levels. No unexpected finding. IMPRESSION: Single intraoperative fluoroscopic image of the cervical spine from reported C4-C7 ACDF as described. No unexpected finding. Electronically Signed   By: Jackey Loge DO   On: 08/28/2019 13:48   MR CERVICAL SPINE WO CONTRAST  Result Date: 08/28/2019 CLINICAL DATA:  Status post ACDF. EXAM: MRI CERVICAL SPINE WITHOUT CONTRAST TECHNIQUE: Multiplanar, multisequence MR imaging of the cervical spine was performed. No intravenous contrast was administered. COMPARISON:  MRI of the cervical spine 07/16/2019. FINDINGS: Alignment:  Anatomic.  No significant listhesis. Vertebrae: Marrow is obscured by fusion hardware at C4-5, C5-6, and C6-7. Cord: Focal T2 hyperintensity in the cord at C5 and C5-6 spanning 1.4 cm stable. No new cord signal abnormality is present. Cord is decompressed at these levels. Posterior Fossa, vertebral arteries, paraspinal tissues: Craniocervical junction is normal. Flow is present in the vertebral arteries bilaterally. Visualized intracranial contents are normal. Disc levels: C2-3: Uncovertebral spurring contributes to stable mild foraminal narrowing bilaterally. C3-4: A broad-based disc osteophyte complex present. Is slight distortion of the ventral surface the cord with partial effacement of ventral CSF. Moderate foraminal stenosis is stable, left greater than right. C4-5: Central canal is decompressed following anterior fusion. Moderate foraminal narrowing is improved bilaterally as well. Foraminal stenosis is worse right than left. C5-6: Central canal is decompressed following anterior fusion and discectomy. Foramina are patent bilaterally. C6-7: Central canal is decompressed following discectomy. Mild residual foraminal narrowing is present bilaterally, markedly improved. C7-T1: Mild disc bulging is present. No significant stenosis is present. IMPRESSION: 1. Interval anterior fusion and discectomy at C4-5, C5-6, and C6-7. 2. Decompression of the central canal at these levels. 3. T2 signal changes at C5 and C5-6 are stable, suggesting myelomalacia versus residual edema. Morphology is preserved. No new cord signal changes are present. 4. Mild residual foraminal narrowing bilaterally at C6-7 is markedly improved. 5. Stable moderate foraminal stenosis bilaterally at C3-4 is stable, left greater than right. 6. Stable mild foraminal narrowing bilaterally at C2-3. 7. Mild disc bulging at C7-T1 without significant stenosis. Electronically Signed   By: Marin Roberts M.D.   On: 08/28/2019 16:01   MR CERVICAL SPINE  WO CONTRAST  Result Date: 07/17/2019 [facility] NEUROIMAGING REPORT STUDY DATE: 07/16/19 PATIENT NAME: Haley Robinson DOB: May 17, 1940 MRN:  [id number] ORDERING CLINICIAN: Suanne Marker, MD CLINICAL HISTORY: 79 year old female with numbness and weakness. EXAM: MR CERVICAL SPINE WO CONTRAST TECHNIQUE: MRI of the cervical spine was obtained utilizing 3 mm sagittal slices from the posterior fossa down to the T3-4 level with T1, T2 and inversion recovery views. In addition 4 mm axial slices from C2-3 down to T1-2 level were included with T2 and gradient echo views. CONTRAST: none COMPARISON: none IMAGING SITE: [facility] [address] (1.5 Tesla MRI)  FINDINGS: On sagittal views the vertebral bodies have normal height and alignment. Degenerative spondylosis and disc bulging at C5-6 and C6-7 and T1-2. The spinal cord is notable for T2 hyperintensity at C5-6 due to spinal stenosis and cord compression. The posterior fossa, pituitary gland and paraspinal soft tissues are unremarkable.  On axial views: C2-3: no spinal stenosis or fworaminal narrowing C3-4: uncovertebral joint hypertrophy with facet hypertrophy with moderate biforaminal stenosis C4-5: uncovertebral joint hypertrophy and facet hypertrophy with severe biforaminal stenosis C5-6: disc bulging and facet hypertrophy with moderate-severe spinal stenosis and severe biforaminal stenosis; myelomalacia on axial views C6-7: disc bulging and facet hypertrophy with mild spinal stenosis and severe biforaminal stenosis C7-T1: ligamentum flavum hypertrophy and ossification of posterior longitudinal ligament with mild spinal stenosis and moderate right and mild left foraminal stenosis T1-2: no spinal stenosis or foraminal narrowing Limited views of the soft tissues of the head and neck are unremarkable.   MRI cervical spine (without) demonstrating: - At C5-6: disc bulging and facet hypertrophy with moderate-severe spinal stenosis and  severe biforaminal stenosis; myelomalacia on axial views. - At C6-7: disc bulging and facet hypertrophy with mild spinal stenosis and severe biforaminal stenosis. - At C7-T1: ligamentum flavum hypertrophy and ossification of posterior longitudinal ligament with mild spinal stenosis and moderate right and mild left foraminal stenosis. - At C4-5: uncovertebral joint hypertrophy and facet hypertrophy with severe biforaminal stenosis. INTERPRETING PHYSICIAN: Suanne Marker, MD Certified in Neurology, Neurophysiology and Neuroimaging [facility] [address] [place], [place] [address] [phone]   DG C-Arm 1-60 Min  Result Date: 08/28/2019 CLINICAL DATA:  Provided history: Elective surgery. Additional provided: C4-7 ACDF, fluoroscopy time 5 seconds, 0.53 mGy EXAM: CERVICAL SPINE - 2-3 VIEW; DG C-ARM 1-60 MIN COMPARISON:  Cervical spine MRI 07/16/2019 FINDINGS: A single lateral view intraoperative fluoroscopic image of the cervical spine is submitted for evaluation. Partially visualized support tubes. ACDF hardware an interbody spacers are present at the C4-C7 levels. No unexpected finding. IMPRESSION: Single intraoperative fluoroscopic image of the cervical spine from reported C4-C7 ACDF as described. No unexpected finding. Electronically Signed   By: Jackey Loge DO   On: 08/28/2019 13:48     Treatments: surgery: C4-5, C5-6, C6-7 anterior cervical discectomy with interbody fusion utilizing interbody peek cages, locally harvested autograft, and anterior plate instrumentation  Discharge Exam: Blood pressure (!) 141/50, pulse 51, temperature 97.8 F (36.6 C), temperature source Oral, resp. rate 20, height 5\' 5"  (1.651 m), weight 62.6 kg, SpO2 93 %.   Alert and oriented x 4 PERRLA CN II-XII grossly intact MAE, Strength and sensation improving RLE still with significant weakness; bilateral hands with decreased dexterity Incision is covered with Steri Strips; Surgical site  is clean, dry, and intact   Disposition: Discharge disposition: [facility]-Another [facility] Not Defined        Allergies as of 09/03/2019   No Known Allergies     Medication List    STOP taking these medications  ALPRAZolam 0.5 MG tablet Commonly known as: XANAX     TAKE these medications   aspirin 81 MG EC tablet Take 81 mg by mouth daily.   cyclobenzaprine 10 MG tablet Commonly known as: FLEXERIL Take 1 tablet (10 mg total) by mouth 3 (three) times daily as needed for muscle spasms.   dexamethasone 1 MG tablet Commonly known as: DECADRON Take 1 tablet (1 mg total) by mouth every 12 (twelve) hours for 5 days.   hydrochlorothiazide 12.5 MG tablet Commonly known as: HYDRODIURIL Take 12.5 mg by mouth daily.   HYDROcodone-acetaminophen 10-325 MG tablet Commonly known as: NORCO Take 1 tablet by mouth every 4 (four) hours as needed for severe pain ((score 7 to 10)).   meclizine 12.5 MG tablet Commonly known as: ANTIVERT Take 12.5 mg by mouth daily as needed for dizziness.   simvastatin 40 MG tablet Commonly known as: ZOCOR Take 40 mg by mouth daily at 6 PM.       Follow-up Information    Pool, Sherilyn Cooter, MD. Schedule an appointment as soon as possible for a visit in 2 week(s).   Specialty: Neurosurgery Contact information: [address] [place] [address] [phone]               Signed: Floreen Comber 09/03/2019, 11:34 AM

## 2019-09-03 NOTE — Progress Notes (Signed)
Charlett Blake, MD  Physician  Physical Medicine and Rehabilitation  PMR Pre-admission     Signed  Date of Service:  08/30/2019 1:10 PM      Related encounter: Admission (Discharged) from 08/28/2019 in [facility]       Show:Clear all _0 Manual_1 Template_2 Copied  Added by: _3 Raechel Ache, OT_4 Charlett Blake, MD_5 Haley Diones, RN  _6 [place] for details PMR Admission Coordinator Pre-Admission Assessment  Patient: Haley Robinson is an 79 y.o., female MRN: [id number] DOB: 08-Aug-1940 Height: _7  (165.1 cm) Weight: 62.6 kg  Insurance Information HMO:    PPO:      PCP:      IPA:      80/20:      OTHER: [place]  PRIMARY: [employer] Medicare Choice      Policy#: [id number]      Subscriber: Haley Robinson CM Name: Haley Robinson      Phone#: [phone]     Fax#: [phone] Pre-Cert#: [id number] authorization good for 5 days.   Update due to Donell Sievert [phone] and fax [phone]      Employer: Retired Benefits:  Phone #: [phone]     Name: Online at [email or web address] Eff. Date: 02/02/19 - 02/01/20     Deduct: $0 for in network providers      Out of Pocket Max: $4000 (met $291.00)      Life Max: N/A CIR: $160/day copay for days 1-10; $0/day copay for days 11-90      SNF: 100% Outpatient: visit limited by medical necessity     Co-Pay: $20/visit Home Health: 100%      Co-Pay: none DME: 80%     Co-Pay: 20% Providers: in network  SECONDARY:  None      Policy#:       Phone#:    Development worker, community:        Phone#:    The Engineer, petroleum" for patients in Inpatient Rehabilitation Facilities with attached "Privacy Act [place] Records" was provided and verbally reviewed with: Patient and Family  Emergency Contact Information         Contact Information    Name Relation Home Work Mobile   [place] Daughter   [phone]      Current Medical History    Patient Admitting Diagnosis: Cervical myelopathy post C 4-7 discectomy/fusion  History of Present Illness:  A 79 year old female with bilateral upper extremity numbness paresthesias and weakness with associated gait instability and spasticity. Work-up demonstrates evidence of marked cervical spondylosis with severe stenosis. Patient underwent C4-7 anterior cervical decompression and fusion on 08/28/19 by Dr. Earnie Larsson.  PT/OT evaluations were completed with recommendations for acute inpatient rehab admission.  Will admit to acute inpatient rehab today.  Patient's medical record from [facility] has been reviewed by the rehabilitation admission coordinator and physician.  Past Medical History      Past Medical History:  Diagnosis Date  . Anxiety    w/ procedures  . Carotid bruit   . Hyperlipidemia   . Hypertension   . Numbness and tingling in both hands   . Osteoarthritis    hands  . Pneumonia    x 1 - over 30 yrs ago  . PONV (postoperative nausea and vomiting)    Nausea with hysterectomy surgery only, no problems with other procedures/surgeries  . Spinal stenosis   . Vertigo, benign positional    x 4 years  .  Vitamin D deficiency     Family History   family history includes Alzheimer's disease in her mother; Lung cancer in her father.  Prior Rehab/Hospitalizations Has the patient had prior rehab or hospitalizations prior to admission? No.  Patient reports B cataract surgery as an outpatient procedure in October/November 2020.  She has no previous inpatient rehab admissions.  Has the patient had major surgery during 100 days prior to admission? Yes, patient had cervical decompression/fusion.           Current Medications  Current Facility-Administered Medications:  .  0.9 %  sodium chloride infusion, 250 mL, Intravenous, Continuous, Pool, Henry, MD, Last Rate: 10 mL/hr at 08/29/19 0300, Rate Verify at 08/29/19 0300 .  acetaminophen  (TYLENOL) tablet 650 mg, 650 mg, Oral, Q4H PRN, 650 mg at 08/31/19 1956 **OR** acetaminophen (TYLENOL) suppository 650 mg, 650 mg, Rectal, Q4H PRN, Earnie Larsson, MD .  cyclobenzaprine (FLEXERIL) tablet 10 mg, 10 mg, Oral, TID PRN, Earnie Larsson, MD, 10 mg at 08/30/19 2154 .  dexamethasone (DECADRON) tablet 1 mg, 1 mg, Oral, Q12H, Pool, Henry, MD, 1 mg at 09/03/19 1025 .  hydrochlorothiazide (HYDRODIURIL) tablet 12.5 mg, 12.5 mg, Oral, Daily, Pool, Mallie Mussel, MD, 12.5 mg at 09/03/19 1024 .  HYDROcodone-acetaminophen (NORCO) 10-325 MG per tablet 2 tablet, 2 tablet, Oral, Q4H PRN, Earnie Larsson, MD, 2 tablet at 09/02/19 2205 .  HYDROcodone-acetaminophen (NORCO/VICODIN) 5-325 MG per tablet 1 tablet, 1 tablet, Oral, Q4H PRN, Earnie Larsson, MD, 1 tablet at 09/02/19 1303 .  HYDROmorphone (DILAUDID) injection 1 mg, 1 mg, Intravenous, Q2H PRN, Pool, Mallie Mussel, MD .  menthol-cetylpyridinium (CEPACOL) lozenge 3 mg, 1 lozenge, Oral, PRN **OR** phenol (CHLORASEPTIC) mouth spray 1 spray, 1 spray, Mouth/Throat, PRN, Pool, Mallie Mussel, MD .  ondansetron (ZOFRAN) tablet 4 mg, 4 mg, Oral, Q6H PRN **OR** ondansetron (ZOFRAN) injection 4 mg, 4 mg, Intravenous, Q6H PRN, Earnie Larsson, MD, 4 mg at 08/28/19 2117 .  simvastatin (ZOCOR) tablet 40 mg, 40 mg, Oral, q1800, Earnie Larsson, MD, 40 mg at 09/02/19 1744 .  sodium chloride flush (NS) 0.9 % injection 3 mL, 3 mL, Intravenous, Q12H, Pool, Mallie Mussel, MD, 3 mL at 09/03/19 1031 .  sodium chloride flush (NS) 0.9 % injection 3 mL, 3 mL, Intravenous, PRN, Earnie Larsson, MD  Patients Current Diet:     Diet Order                  Diet regular Room service appropriate? Yes; Fluid consistency: Thin  Diet effective now                  Precautions / Restrictions Precautions Precautions: Fall Precaution Booklet Issued: No Precaution Comments: verbally review cervical precautions, L sided spasticity Cervical Brace: Soft collar Restrictions Weight Bearing Restrictions: No Other  Position/Activity Restrictions: no sensation/functional use of bil hands   Has the patient had 2 or more falls or a fall with injury in the past year? No  Prior Activity Level Limited Community (1-2x/wk): Went out 3 days a week.  took dog to the vet, played cards on Friday, went out with friends on Wednesday, went grocery shopping.  Prior Functional Level Self Care: Did the patient need help bathing, dressing, using the toilet or eating? Independent  Indoor Mobility: Did the patient need assistance with walking from room to room (with or without device)? Independent  Stairs: Did the patient need assistance with internal or external stairs (with or without device)? Independent  Functional Cognition: Did the patient need help planning  regular tasks such as shopping or remembering to take medications? Independent  Home Assistive Devices / [place] Devices/Equipment: Eyeglasses Home Equipment: Walker - 2 wheels  Prior Device Use: Indicate devices/aids used by the patient prior to current illness, exacerbation or injury? None of the above  Current Functional Level Cognition  Overall Cognitive Status: Within Functional Limits for tasks assessed Orientation Level: Oriented X4    Extremity Assessment (includes Sensation/Coordination)  Upper Extremity Assessment: RUE deficits/detail, LUE deficits/detail RUE Deficits / Details: diminished hand strength to a 3- out 5 gross grasp. 3 out 5 bicep and weaker tricep  RUE Sensation: decreased light touch RUE Coordination: decreased fine motor, decreased gross motor LUE Deficits / Details: decrease coordination for wrist extension pt initially pulling arm into flexion. pt requires multiple attempts to coordinate wrist extension, grasp 3 out 5 , pt is left hand dominant with decrease tricep LUE Sensation: decreased light touch LUE Coordination: decreased fine motor, decreased gross motor  Lower Extremity Assessment: RLE  deficits/detail RLE Deficits / Details: 0/5 and not movement at the toes. pt with extensor tone with input from therapist noted in supine, unable to initiate or sustain hip flexion RLE Sensation: decreased light touch RLE Coordination: decreased fine motor, decreased gross motor LLE Deficits / Details: pt with internal ankle rotation when attempting knee extension. pt unable to sustain hip flexion/ knee flexion but activated. pt able to move toes and ankle    ADLs  Overall ADL's : Needs assistance/impaired Eating/Feeding: Minimal assistance, Bed level, With adaptive utensils Eating/Feeding Details (indicate cue type and reason): pt using bil UE on standard cup drink with straw. pt reports ability to use red foam utensil with breakfast. daughter present to confirm Grooming: Maximal assistance, Bed level Grooming Details (indicate cue type and reason): decrease coordinated movement to reach nose Upper Body Bathing: Maximal assistance, Bed level Lower Body Bathing: Total assistance, Sitting/lateral leans, Sit to/from stand Upper Body Dressing : Maximal assistance, Bed level Lower Body Dressing: Total assistance, Sitting/lateral leans, Sit to/from stand Toilet Transfer: Total assistance, +2 for physical assistance, +2 for safety/equipment Toilet Transfer Details (indicate cue type and reason): attempted sit <> stand with and without stedy. Pt not able to clear hips/bear weight through bil legs Toileting- Clothing Manipulation and Hygiene: Total assistance Toileting - Clothing Manipulation Details (indicate cue type and reason): using purewick but reports urinary sensation. Has not yet had BM to know if she has urge or not Functional mobility during ADLs: Maximal assistance, +2 for physical assistance, +2 for safety/equipment (EOB only) General ADL Comments: session focused on eob static sitting balance.     Mobility  Overal bed mobility: Needs Assistance Bed Mobility: Rolling, Sidelying to  Sit Rolling: Mod assist Sidelying to sit: Max assist Supine to sit: +2 for safety/equipment Sit to sidelying: Max assist General bed mobility comments: Without using hook method, had pt reach from left to right and try coming into hip flexion with LE, but pt moved more into extension without moderate assist    Transfers  Overall transfer level: Needs assistance Equipment used: 1 person hand held assist Transfer via Lift Equipment: Stedy Transfers: Sit to/from Stand, [employer] Sit to Stand: Total assist Squat pivot transfers: Total assist  Lateral/Scoot Transfers: Total assist General transfer comment: cues to push up then "bear hug".  2 trials of standing, 1 blocking L knee and 1 trial blocking R knee.  pt attained upright stance x2, but much more control blocking R knee.    Ambulation /  Gait / Stairs / Emergency planning/management officer  Ambulation/Gait General Gait Details: Unable    Posture / Balance Dynamic Sitting Balance Sitting balance - Comments: max assist generally, working on upright sitting with truncal extension optimizing position of UE's. Balance Overall balance assessment: Needs assistance Sitting-balance support: Bilateral upper extremity supported, Feet supported Sitting balance-Leahy Scale: Poor Sitting balance - Comments: max assist generally, working on upright sitting with truncal extension optimizing position of UE's. Postural control: Posterior lean, Left lateral lean, Right lateral lean Standing balance support: Bilateral upper extremity supported Standing balance-Leahy Scale: Zero Standing balance comment: totalA x2 trials attaining full knee extension and standing for 10-15 secs each .    Special needs/care consideration Skin soft C-collar in place over post op surgical incision; has pressure are on coccyx that patient has expressed concerns about. Designated visitor Daughters Insurance account manager and [place] (from acute therapy  documentation) Living Arrangements: Children (daughter) Available Help at Discharge: Family, Available PRN/intermittently (daughter works) Type of Home: House Home Layout: One level Home Access: Stairs to enter Entrance Stairs-Rails: Horticulturist, commercial of Steps: 6-7 Bathroom Shower/Tub: Gaffer, Chiropodist: Handicapped height  Discharge Living Setting Plans for Discharge Living Setting: Patient's home, [place], Lives with (comment) (Lives with her daughter, Haley Robinson.) Type of Home at Discharge: House Discharge Home Layout: One level Discharge Home Access: Ramped entrance (Ramp at the back entrance) Discharge Bathroom Shower/Tub: Tub/shower unit, Walk-in shower (Has both units in different bathrooms.) Discharge Bathroom Toilet: Handicapped height (one toilet is standard and another is elevated.) Discharge Bathroom Accessibility: No Does the patient have any problems obtaining your medications?: No  Social/Family/Support Systems Patient Roles: Parent, Other (Comment) (Is a widow of 5 years.  Has 2 daughters, grandson, sister) Contact Information: Haley Robinson - daughter - [phone] Anticipated Caregiver: Haley Robinson, a sister, a grandson Anticipated Caregiver's Contact Information: Haley Robinson [phone] and Haley Robinson [phone] Ability/Limitations of Caregiver: Dtr Haley Robinson works 8 to 430 pm M-F, Haley Robinson works 8 to 12 noon M-F, Sister can help during the day, Haley Robinson can help 1-2 days a week.  Haley Robinson plans to leave work and go to patients home daily as needed.  They are open to hiring help as needed. Caregiver Availability: 24/7 Haley Robinson understands the need for 24/7 assist after DC) Discharge Plan Discussed with Primary Caregiver: Yes Is Caregiver In Agreement with Plan?: Yes Does Caregiver/Family have Issues with Lodging/Transportation while Pt is in Rehab?: No  Goals Patient/Family Goal for Rehab: PT/OT min/mod assist goals Expected  length of stay: 20-24 days Cultural Considerations: None Pt/Family Agrees to Admission and willing to participate: Yes Program Orientation Provided & Reviewed with Pt/Caregiver Including Roles  & Responsibilities: Yes  Decrease burden of Care through IP rehab admission: N/A  Possible need for SNF placement upon discharge: Patient and family do not want SNF placement.  Patient Condition: I have reviewed medical records from [facility], spoken with CM, and patient and daughter. I met with patient at the bedside for inpatient rehabilitation assessment.  Patient will benefit from ongoing PT and OT, can actively participate in 3 hours of therapy a day 5 days of the week, and can make measurable gains during the admission.  Patient will also benefit from the coordinated team approach during an Inpatient Acute Rehabilitation admission.  The patient will receive intensive therapy as well as Rehabilitation physician, nursing, social worker, and care management interventions.  Due to bladder management, bowel management, safety, skin/wound care, disease management, medication administration,  pain management and patient education the patient requires 24 hour a day rehabilitation nursing.  The patient is currently max to total assist with mobility and basic ADLs.  Discharge setting and therapy post discharge at home with home health is anticipated.  Patient has agreed to participate in the Acute Inpatient Rehabilitation Program and will admit today.  Preadmission Screen Completed By:  Haley Robinson, 09/03/2019 11:12 AM ______________________________________________________________________   Discussed status with Dr. Letta Pate on 09/03/2019 at 1000 and received approval for admission today.  Admission Coordinator:  Karene Fry, RN and Raechel Ache, OT, time 1113/Date 09/03/2019   Assessment/Plan: Diagnosis:Cervical stenosis with myelopathy  1. Does the need for close, 24 hr/day Medical  supervision in concert with the patient's rehab needs make it unreasonable for this patient to be served in a less intensive setting? Yes 2. Co-Morbidities requiring supervision/potential complications: HTN , post op pain 3. Due to bladder management, bowel management, safety, skin/wound care, disease management, medication administration, pain management and patient education, does the patient require 24 hr/day rehab nursing? Yes 4. Does the patient require coordinated care of a physician, rehab nurse, PT, OT, and SLP to address physical and functional deficits in the context of the above medical diagnosis(es)? Yes Addressing deficits in the following areas: balance, endurance, locomotion, strength, transferring, bowel/bladder control, bathing, dressing, feeding, grooming, toileting and psychosocial support 5. Can the patient actively participate in an intensive therapy program of at least 3 hrs of therapy 5 days a week? Yes 6. The potential for patient to make measurable gains while on inpatient rehab is good 7. Anticipated functional outcomes upon discharge from inpatient rehab: min assist and mod assist PT, min assist and mod assist OT, n/a SLP 8. Estimated rehab length of stay to reach the above functional goals is: 20-24 d 9. Anticipated discharge destination: Home 10. Overall Rehab/Functional Prognosis: good   MD Signature: Charlett Blake M.D. [facility] FAAPM&R (Neuromuscular Med) Diplomate Am Board of Electrodiagnostic Med Fellow Am Board of Interventional Pain           Revision History                                                                       Note Details  Author Charlett Blake, MD File Time 09/03/2019 11:25 AM  Author Type Physician Status Signed  Last Editor Charlett Blake, MD Service Physical Medicine and [place] # [id number] [place] Date 09/03/2019

## 2019-09-03 NOTE — Discharge Instructions (Signed)
Wound Care Keep incision covered and dry for two days.    Do not put any creams, lotions, or ointments on incision. Leave steri-strips on back.  They will fall off by themselves.  Activity Walk each and every day, increasing distance each day. No lifting greater than 5 lbs.  Avoid excessive neck motion. No driving for 2 weeks; may ride as a passenger locally.  Diet Resume your normal diet.   Return to Work Will be discussed at your follow up appointment.  Call Your Doctor If Any of These Occur Redness, drainage, or swelling at the wound.  Temperature greater than 101 degrees. Severe pain not relieved by pain medication. Incision starts to come apart.  Follow Up Appt Call today for appointment in 1-2 weeks ([phone]) or for problems.  If you have any hardware placed in your spine, you will need an x-ray before your appointment.  

## 2019-09-03 NOTE — Progress Notes (Signed)
Patient admitted to [phone]. A&Ox4, no complaints of pain. Oriented to room and unit. Aware of fall and visitor policy

## 2019-09-03 NOTE — TOC Transition Note (Signed)
Transition of Care [facility]) - CM/SW Discharge Note   Patient Details  Name: Haley Robinson MRN: [id number] Date of Birth: 1940/09/02  [facility] CM/SW Contact:  Kermit Balo, RN Phone Number: 09/03/2019, 12:22 PM   Clinical Narrative:    Pt is discharging to CIR today. CM signing off.    Final next level of care: IP Rehab Facility Barriers to Discharge: No Barriers Identified   Patient Goals and CMS Choice        Discharge Placement                       Discharge Plan and Services                                     Social Determinants of Health (SDOH) Interventions     Readmission Risk Interventions No flowsheet data found.

## 2019-09-03 NOTE — H&P (Signed)
Physical Medicine and Rehabilitation Admission H&P     CC: Functional deficits due to cervical myelopathy     HPI: Haley Robinson is a 79 year old female with history of HTN, numbness and weakness in hands since 2000 felt to be due to cervical spinal stenosis.  1 month prior to admission, she started having progressive symptoms with gait instability and spasticity left greater than right side.  Work-up done revealing severe cervical stenosis with myelopathy C4/5 to C6/7.  She was admitted on 08/28/2019 for ACDF C4-C7 by Dr. Dutch Quint.  Postop has had weakness BLE with spasms RLE and sensory deficits around C7.  Neurosurgery added steroids for postop symptoms which would gradually improve a few weeks.  Therapy ongoing and patient limited by quadriplegia and working on pregait activity/standing trials.  CIR recommended due to functional decline.     ROS   HEENT: Negative for dysphagia negative for oral or nasal pain Respiratory denies chest pains or shortness of breath, no wheezing Chest has some tenderness around the upper chest wall, no DOE Abdomen: Positive constipation negative abdominal pain negative nausea vomiting GU using periwick, patient states that she can tell when she has to go MSK tailbone pain, limited right lower extremity movement neuro numbness in the hands, weakness right greater than left side in the upper and lower limb, no facial weakness Skin no rashes or itching Extremity no swelling       Past Medical History:  Diagnosis Date  . Anxiety      w/ procedures  . Carotid bruit    . Hyperlipidemia    . Hypertension    . Numbness and tingling in both hands    . Osteoarthritis      hands  . Pneumonia      x 1 - over 30 yrs ago  . PONV (postoperative nausea and vomiting)      Nausea with hysterectomy surgery only, no problems with other procedures/surgeries  . Spinal stenosis    . Vertigo, benign positional      x 4 years  . Vitamin D deficiency        Past Surgical  History:  Procedure Laterality Date  . ABDOMINAL HYSTERECTOMY   1981  . ABDOMINAL HYSTERECTOMY      . ANTERIOR CERVICAL DECOMP/DISCECTOMY FUSION N/A 08/28/2019    Procedure: Anterior Cervical Decompression/Discectomy Fusion - Cervical four-Cervical five - Cervical five-Cervical six - Cervical six-Cervical seven;  Surgeon: Julio Sicks, MD;  Location: [facility] OR;  Service: Neurosurgery;  Laterality: N/A;  . CATARACT EXTRACTION, BILATERAL   11/2018  . COLONOSCOPY   06/2018  . TONSILLECTOMY      . WISDOM TOOTH EXTRACTION               Family History  Problem Relation Age of Onset  . Alzheimer's disease Mother    . Lung cancer Father        Social History: Lives with family.  Was independent PTA.   reports that she has been smoking cigarettes. She has a 29.00 pack-year smoking history. She has never used smokeless tobacco. She reports that she does not drink alcohol and does not use drugs.      Allergies: No Known Allergies      Medications Prior to Admission  Medication Sig Dispense Refill  . aspirin 81 MG EC tablet Take 81 mg by mouth daily.      . hydrochlorothiazide (HYDRODIURIL) 12.5 MG tablet Take 12.5 mg by mouth daily.       Marland Kitchen  simvastatin (ZOCOR) 40 MG tablet Take 40 mg by mouth daily at 6 PM.       . ALPRAZolam (XANAX) 0.5 MG tablet for sedation before MRI scan; take 1 tab 1 hour before scan; may repeat 1 tab 15 min before scan (Patient taking differently: Take 0.5 mg by mouth. for sedation before MRI scan; take 1 tab 1 hour before scan; may repeat 1 tab 15 min before scan) 3 tablet 0  . meclizine (ANTIVERT) 12.5 MG tablet Take 12.5 mg by mouth daily as needed for dizziness.           Drug Regimen Review  Drug regimen was reviewed and remains appropriate with no significant issues identified   Home: Home Living Family/patient expects to be discharged to:: Private residence Living Arrangements: Children (daughter) Available Help at Discharge: Family, Available PRN/intermittently  (daughter works) Type of Home: House Home Access: Stairs to enter Secretary/administrator of Steps: 6-7 Entrance Stairs-Rails: Left Home Layout: One level Bathroom Shower/Tub: Psychologist, counselling, Nurse, adult Toilet: Handicapped height Home Equipment: Environmental consultant - 2 wheels   Functional History: Prior Function Level of Independence: Independent Comments: reports being independent, started to lose function of her hands   Functional Status:  Mobility: Bed Mobility Overal bed mobility: Needs Assistance Bed Mobility: Rolling, Sidelying to Sit Rolling: Mod assist Sidelying to sit: Max assist Supine to sit: +2 for safety/equipment Sit to sidelying: Max assist General bed mobility comments: Without using hook method, had pt reach from left to right and try coming into hip flexion with LE, but pt moved more into extension without moderate assist Transfers Overall transfer level: Needs assistance Equipment used: 1 person hand held assist Transfer via Lift Equipment: Stedy Transfers: Sit to/from Stand, [employer] Sit to Stand: Total assist Squat pivot transfers: Total assist  Lateral/Scoot Transfers: Total assist General transfer comment: cues to push up then "bear hug".  2 trials of standing, 1 blocking L knee and 1 trial blocking R knee.  pt attained upright stance x2, but much more control blocking R knee. Ambulation/Gait General Gait Details: Unable   ADL: ADL Overall ADL's : Needs assistance/impaired Eating/Feeding: Minimal assistance, Bed level, With adaptive utensils Eating/Feeding Details (indicate cue type and reason): pt using bil UE on standard cup drink with straw. pt reports ability to use red foam utensil with breakfast. daughter present to confirm Grooming: Maximal assistance, Bed level Grooming Details (indicate cue type and reason): decrease coordinated movement to reach nose Upper Body Bathing: Maximal assistance, Bed level Lower Body Bathing: Total  assistance, Sitting/lateral leans, Sit to/from stand Upper Body Dressing : Maximal assistance, Bed level Lower Body Dressing: Total assistance, Sitting/lateral leans, Sit to/from stand Toilet Transfer: Total assistance, +2 for physical assistance, +2 for safety/equipment Toilet Transfer Details (indicate cue type and reason): attempted sit <> stand with and without stedy. Pt not able to clear hips/bear weight through bil legs Toileting- Clothing Manipulation and Hygiene: Total assistance Toileting - Clothing Manipulation Details (indicate cue type and reason): using purewick but reports urinary sensation. Has not yet had BM to know if she has urge or not Functional mobility during ADLs: Maximal assistance, +2 for physical assistance, +2 for safety/equipment (EOB only) General ADL Comments: session focused on eob static sitting balance.    Cognition: Cognition Overall Cognitive Status: Within Functional Limits for tasks assessed Orientation Level: Oriented X4 Cognition Arousal/Alertness: Awake/alert Behavior During Therapy: WFL for tasks assessed/performed Overall Cognitive Status: Within Functional Limits for tasks assessed   Physical Exam: Blood  pressure (!) 141/49, pulse (!) 51, temperature 98.2 F (36.8 C), temperature source Oral, resp. rate 20, height 5\' 5"  (1.651 m), weight 62.6 kg, SpO2 96 %. Physical Exam   Lab Results Last 48 Hours  No results found for this or any previous visit (from the past 48 hour(s)).   Imaging Results (Last 48 hours)  No results found.       General: No acute distress Mood and affect are appropriate Heart: Regular rate and rhythm no rubs murmurs or extra sounds Lungs: Clear to auscultation, breathing unlabored, no rales or wheezes Abdomen: Positive bowel sounds, soft nontender to palpation, nondistended Extremities: No clubbing, cyanosis, or edema Skin: No evidence of breakdown, no evidence of rash, multiple seborrheic keratoses on the  back Neurologic: Tone mild hyperreflexia right lower extremity no clonus at the ankles bilaterally no evidence of flexor withdrawal Motor strength 4+ bilateral deltoid bicep 4 - bilateral tricep trace finger flexors bilaterally trace finger extensors bilaterally 0 hand intrinsics bilaterally Right lower extremity to minus hip extension 0 quadriceps 0 ankle dorsiflexion plantarflexion Left lower extremity 5/5 in the hip flexor knee extensor ankle dorsiflexor and plantar flexor Musculoskeletal: No pain with active assisted as well as passive range of motion in the upper lower limb no joint swelling    Medical Problem List and Plan: 1.    Tetraparesis secondary to cervical myelopathy             -patient may  shower             -ELOS/Goals: 20 to 24 days 2.  Antithrombotics: -DVT/anticoagulation:  Mechanical: Sequential compression devices, below knee Bilateral lower extremities             -antiplatelet therapy: N/A 3. Pain Management: Hydrocodone prn  4. Mood: LCSW to follow for evaluation and support.              -antipsychotic agents: N/A 5. Neuropsych: This patient is capable of making decisions on her own behalf. 6. Skin/Wound Care:  Monitor wound for healing. Will add protein supplement and multivitamin to promote healing.  7. Fluids/Electrolytes/Nutrition: Monitor I/O. Check lytes in am.  8. HTN: Monitor BP tid--continue HCTZ daily. Check BMET/K+ level in am.  9. Post op labs: Pre-op K+ low normal. Will order for tomorrow.  10 Neurogenic bladder: Has been incontinent with external catheter--will monitor voiding with PVRs/bladder scan. 11.Neurogenic bowel: Has not had BM documented since admission. Will order KUB for stool burden. Miralax today followed by enema. Will start bowel program with daily miralax for now.           M.D. [facility] FAAPM&R (Neuromuscular Med) Diplomate Am Board of Electrodiagnostic Med Fellow Am Board of Interventional  Pain    Erick Colace, PA-C 09/03/2019

## 2019-09-03 NOTE — Progress Notes (Signed)
Inpatient Rehabilitation Medication Review by a Pharmacist  A complete drug regimen review was completed for this patient to identify any potential clinically significant medication issues.  Clinically significant medication issues were identified:  yes   Type of Medication Issue Identified Description of Issue Urgent (address now) Non-Urgent (address on AM team rounds) Plan Plan Accepted by Provider? (Yes / No / Pending AM Rounds)                    No Medication Administration End Date  Need end date of 5d for dexamethasone Non-urgent Address with team in AM Pending        Additional Drug Therapy Needed  Resume ASA per dc note Non-urgent Address with team in AM Pending  Other         Name of provider notified for urgent issues identified:   Provider Method of Notification: secure chat   For non-urgent medication issues to be resolved on team rounds tomorrow morning a [facility] Secure Chat Handoff was sent to:    Pharmacist comments: Will address with team in AM  Time spent performing this drug regimen review (minutes):  10 minutes   Atara Paterson 09/03/2019 6:10 PM

## 2019-09-04 ENCOUNTER — Inpatient Hospital Stay (HOSPITAL_COMMUNITY): Payer: Medicare PPO

## 2019-09-04 ENCOUNTER — Inpatient Hospital Stay (HOSPITAL_COMMUNITY): Payer: Medicare PPO | Admitting: Occupational Therapy

## 2019-09-04 ENCOUNTER — Inpatient Hospital Stay (HOSPITAL_COMMUNITY): Payer: Medicare PPO | Admitting: Physical Therapy

## 2019-09-04 DIAGNOSIS — M7989 Other specified soft tissue disorders: Secondary | ICD-10-CM

## 2019-09-04 LAB — URINALYSIS, ROUTINE W REFLEX MICROSCOPIC
Bilirubin Urine: NEGATIVE
Glucose, UA: NEGATIVE mg/dL
Hgb urine dipstick: NEGATIVE
Ketones, ur: NEGATIVE mg/dL
Leukocytes,Ua: NEGATIVE
Nitrite: NEGATIVE
Protein, ur: NEGATIVE mg/dL
Specific Gravity, Urine: 1.017 (ref 1.005–1.030)
pH: 6 (ref 5.0–8.0)

## 2019-09-04 LAB — CBC WITH DIFFERENTIAL/PLATELET
Abs Immature Granulocytes: 0.09 10*3/uL — ABNORMAL HIGH (ref 0.00–0.07)
Basophils Absolute: 0 10*3/uL (ref 0.0–0.1)
Basophils Relative: 0 %
Eosinophils Absolute: 0.1 10*3/uL (ref 0.0–0.5)
Eosinophils Relative: 0 %
HCT: 38.8 % (ref 36.0–46.0)
Hemoglobin: 12.8 g/dL (ref 12.0–15.0)
Immature Granulocytes: 1 %
Lymphocytes Relative: 18 %
Lymphs Abs: 2.4 10*3/uL (ref 0.7–4.0)
MCH: 31.9 pg (ref 26.0–34.0)
MCHC: 33 g/dL (ref 30.0–36.0)
MCV: 96.8 fL (ref 80.0–100.0)
Monocytes Absolute: 1.1 10*3/uL — ABNORMAL HIGH (ref 0.1–1.0)
Monocytes Relative: 8 %
Neutro Abs: 10.2 10*3/uL — ABNORMAL HIGH (ref 1.7–7.7)
Neutrophils Relative %: 73 %
Platelets: 194 10*3/uL (ref 150–400)
RBC: 4.01 MIL/uL (ref 3.87–5.11)
RDW: 13 % (ref 11.5–15.5)
WBC: 13.8 10*3/uL — ABNORMAL HIGH (ref 4.0–10.5)
nRBC: 0 % (ref 0.0–0.2)

## 2019-09-04 LAB — COMPREHENSIVE METABOLIC PANEL
ALT: 26 U/L (ref 0–44)
AST: 16 U/L (ref 15–41)
Albumin: 2.6 g/dL — ABNORMAL LOW (ref 3.5–5.0)
Alkaline Phosphatase: 51 U/L (ref 38–126)
Anion gap: 8 (ref 5–15)
BUN: 19 mg/dL (ref 8–23)
CO2: 29 mmol/L (ref 22–32)
Calcium: 8.6 mg/dL — ABNORMAL LOW (ref 8.9–10.3)
Chloride: 102 mmol/L (ref 98–111)
Creatinine, Ser: 0.77 mg/dL (ref 0.44–1.00)
GFR calc Af Amer: >60 mL/min (ref >60)
GFR calc non Af Amer: >60 mL/min (ref >60)
Glucose, Bld: 127 mg/dL — ABNORMAL HIGH (ref 70–99)
Potassium: 3.9 mmol/L (ref 3.5–5.1)
Sodium: 139 mmol/L (ref 135–145)
Total Bilirubin: 0.7 mg/dL (ref 0.3–1.2)
Total Protein: 4.9 g/dL — ABNORMAL LOW (ref 6.5–8.1)

## 2019-09-04 MED ORDER — TRAZODONE HCL 50 MG PO TABS
50.0000 mg | ORAL_TABLET | Freq: Every day | ORAL | Status: DC
  Administered 2019-09-04: 50 mg via ORAL
  Filled 2019-09-04: qty 1

## 2019-09-04 MED ORDER — ASPIRIN EC 81 MG PO TBEC
81.0000 mg | DELAYED_RELEASE_TABLET | Freq: Every day | ORAL | Status: DC
  Administered 2019-09-04 – 2019-10-03 (×30): 81 mg via ORAL
  Filled 2019-09-04 (×31): qty 1

## 2019-09-04 MED ORDER — PNEUMOCOCCAL VAC POLYVALENT 25 MCG/0.5ML IJ INJ
0.5000 mL | INJECTION | INTRAMUSCULAR | Status: AC
  Administered 2019-09-05: 0.5 mL via INTRAMUSCULAR
  Filled 2019-09-04: qty 0.5

## 2019-09-04 NOTE — Plan of Care (Signed)
°  Problem: Consults Goal: RH SPINAL CORD INJURY PATIENT EDUCATION Description:  See Patient Education module for education specifics.  Outcome: Progressing Goal: Skin Care Protocol Initiated - if Braden Score 18 or less Description: If consults are not indicated, leave blank or document N/A Outcome: Progressing   Problem: SCI BOWEL ELIMINATION Goal: RH STG MANAGE BOWEL WITH ASSISTANCE Description: STG Manage Bowel with mod Assistance. Outcome: Progressing Goal: RH STG SCI MANAGE BOWEL WITH MEDICATION WITH ASSISTANCE Description: STG SCI Manage bowel with medication with min/mod assistance. Outcome: Progressing   Problem: RH SKIN INTEGRITY Goal: RH STG SKIN FREE OF INFECTION/BREAKDOWN Description: Pt will be free of skin breakdown/infection prior to DC with mod/max assist Outcome: Progressing Goal: RH STG MAINTAIN SKIN INTEGRITY WITH ASSISTANCE Description: STG Maintain Skin Integrity With mod/max Assistance. Outcome: Progressing   Problem: RH SAFETY Goal: RH STG ADHERE TO SAFETY PRECAUTIONS W/ASSISTANCE/DEVICE Description: STG Adhere to Safety Precautions With mod Assistance/Device. Outcome: Progressing

## 2019-09-04 NOTE — Progress Notes (Signed)
Orthopedic Tech Progress Note Patient Details:  Haley Robinson Apr 05, 1940 [id number]  Ortho Devices Type of Ortho Device: Prafo boot/shoe Ortho Device/Splint Location: rle Ortho Device/Splint Interventions: Ordered, Application, Adjustment   Post Interventions Patient Tolerated: Well Instructions Provided: Care of device, Adjustment of device   Trinna Post 09/04/2019, 3:49 AM

## 2019-09-04 NOTE — Progress Notes (Signed)
Inpatient Rehabilitation  Patient information reviewed and entered into eRehab system by Jalani Cullifer M. Neville Walston, M.A., CCC/SLP, PPS Coordinator.  Information including medical coding, functional ability and quality indicators will be reviewed and updated through discharge.    

## 2019-09-04 NOTE — Progress Notes (Signed)
Occupational Therapy Session Note  Patient Details  Name: HANA TRIPPETT MRN: [id number] Date of Birth: 06/15/1940  Today's Date: 09/04/2019 OT Individual Time: 1130-1200 OT Individual Time Calculation (min): 30 min   Skilled Therapeutic Interventions/Progress Updates:  Patient met seated in TIS wc in agreement with OT treatment session with focus on BUE NMR and AROM as detailed below. Patient reporting 0/10 pain at rest and with activity. Patient able to grasp foam blocks with increased time/effort and place in cup demonstrating good shoulder activation, elbow activation and some extension of the wrist with tenodesis grasp although noted compensatory wrist pronation/supination. Patient able to demonstrate isolated wrist extension bilaterally, but unable to actively extend digits 1-5 without external assist. Session concluded with patient seated in TIS wc with soft call bell within reach, belt alarm activated, and all needs met.   Therapy Documentation Precautions:  Restrictions Weight Bearing Restrictions: No General:     Therapy/Group: Individual Therapy  Rihana Kiddy R Howerton-Davis 09/04/2019, 12:15 PM

## 2019-09-04 NOTE — Progress Notes (Signed)
Patient ID: Haley Robinson, female   DOB: 1940-05-18, 79 y.o.   MRN: [id number]  SW met with pt and pt dtr Haley Robinson to provide updates from team conference, and inform d/c date TBD. SW briefly reviewed d/c process, and statement of service form that will follow with ELOS. SW informed there will be updates after team conference.   Loralee Pacas, MSW, [place] Office: [phone] Cell: [phone] Fax: [phone]

## 2019-09-04 NOTE — Progress Notes (Signed)
Bilateral lower extremity venous duplex complete.  Please see CV Proc tab for preliminary results. Texted Dr. Berline Chough @ 16:45. Levin Bacon- RDMS, RVT 4:48 PM  09/04/2019

## 2019-09-04 NOTE — Patient Care Conference (Signed)
Inpatient RehabilitationTeam Conference and Plan of Care Update Date: 09/04/2019   Time: 11:20 AM   Patient Name: Haley Robinson      Medical Record Number: [id number]  Date of Birth: 31-Jul-1940 Sex: Female         Room/Bed: 4M03C/4M03C-01 Payor Info: Payor: [employer] MEDICARE / Plan: [employer] MEDICARE CHOICE PPO / Product Type: *No Product type* /    Admit Date/Time:  09/03/2019  5:32 PM  Primary Diagnosis:  Cervical myelopathy [facility])  Hospital Problems: Principal Problem:   Cervical myelopathy (HCC) Active Problems:   Cervical arthritis with myelopathy    Expected Discharge Date: Expected Discharge Date:  (TBD)  Team Members Present: Physician leading conference: Dr. Sula Soda Care Coodinator Present: Cecile Sheerer, LCSWA;Merwin Breden Marlyne Beards, RN, BSN, CRRN Nurse Present: Adam Phenix, LPN PT Present: Peter Congo, PT OT Present: Ardis Rowan, COTA;Jennifer Katrinka Blazing, OT PPS Coordinator present : Fae Pippin, [place], PT     Current Status/Progress Goal Weekly Team Focus  Bowel/Bladder   Pt is incontinent of bowel and bladder. Says she barely has any feeling when she has bowel movements and she has urgency with bladder.  To become more aware of bowel movements and continence.  Assess tolieting needs q2.   Swallow/Nutrition/ Hydration             ADL's             Mobility   PT eval pending  PT eval pending  PT eval pending   Communication             Safety/Cognition/ Behavioral Observations            Pain   Complains of lower back pain 3/10, given prn tylenol.  To bring pain level below 2/10.  Assess pain q shift.   Skin   MASD to groin area. Barrier cream used.  To prevent breakdown from occurring.  Assess skin q shift.     Team Discussion:  Discharge Planning/Teaching Needs:  Pt daughter [name] and husband live with pt in the home. Both daughters- [name] and [name] will alternate their time with assisting with care.  Family education as  recommended by therapy   Current Update:  none  Current Barriers to Discharge:  Incontinence and urinary urgency.  Possible Resolutions to Barriers: Determine if patient needs bowel program, time toilet q 2-3 hrs.  Patient on target to meet rehab goals: PT, OT evals pending at time of team conference. Patient has pain from lower back to bottom. Turn patient q 2 hr for pressure relief.  *See Care Plan and progress notes for long and short-term goals.   Revisions to Treatment Plan:  None.    Medical Summary Current Status: Pressure in sacral region, increase in white blood cell count, urinary retention Weekly Focus/Goal: q2 repositioning, UA/UC, repeat CBC tomorrow, can trial Toviaz if urinary retention does not improve.  Barriers to Discharge: Medical stability;Neurogenic Bowel & Bladder  Barriers to Discharge Comments: Pressure in sacral region, increase in white blood cell count Possible Resolutions to Barriers: q2 repositioning, UA/UC, repeat CBC tomorrow   Continued Need for Acute Rehabilitation Level of Care: The patient requires daily medical management by a physician with specialized training in physical medicine and rehabilitation for the following reasons: Direction of a multidisciplinary physical rehabilitation program to maximize functional independence : Yes Medical management of patient stability for increased activity during participation in an intensive rehabilitation regime.: Yes Analysis of laboratory values and/or radiology reports with any subsequent need for  medication adjustment and/or medical intervention. : Yes   I attest that I was present, lead the team conference, and concur with the assessment and plan of the team.   Tennis Must 09/04/2019, 4:51 PM

## 2019-09-04 NOTE — Progress Notes (Signed)
Occupational Therapy Assessment and Plan  Patient Details  Name: Haley Robinson MRN: [id number] Date of Birth: Nov 07, 1940  OT Diagnosis: abnormal posture, ataxia, muscle weakness (generalized) and quadriplegia at level C4-6 Rehab Potential: Rehab Potential (ACUTE ONLY): Good ELOS: 20-24 days   Today's Date: 09/04/2019 OT Individual Time: 1130-1200 OT Individual Time Calculation (min): 30 min     Hospital Problem: Principal Problem:   Cervical myelopathy ([facility]) Active Problems:   Cervical arthritis with myelopathy   Past Medical History:  Past Medical History:  Diagnosis Date  . Anxiety    w/ procedures  . Carotid bruit   . Hyperlipidemia   . Hypertension   . Numbness and tingling in both hands   . Osteoarthritis    hands  . Pneumonia    x 1 - over 30 yrs ago  . PONV (postoperative nausea and vomiting)    Nausea with hysterectomy surgery only, no problems with other procedures/surgeries  . Spinal stenosis   . Vertigo, benign positional    x 4 years  . Vitamin D deficiency    Past Surgical History:  Past Surgical History:  Procedure Laterality Date  . ABDOMINAL HYSTERECTOMY  1981  . ABDOMINAL HYSTERECTOMY    . ANTERIOR CERVICAL DECOMP/DISCECTOMY FUSION N/A 08/28/2019   Procedure: Anterior Cervical Decompression/Discectomy Fusion - Cervical four-Cervical five - Cervical five-Cervical six - Cervical six-Cervical seven;  Surgeon: Earnie Larsson, MD;  Location: [facility];  Service: Neurosurgery;  Laterality: N/A;  . CATARACT EXTRACTION, BILATERAL  11/2018  . COLONOSCOPY  06/2018  . TONSILLECTOMY    . WISDOM TOOTH EXTRACTION      Assessment & Plan Clinical Impression:  Haley Robinson is a 79 year old female with history of HTN, numbness and weakness in hands since 2000 felt to be due to cervical spinal stenosis. 1 month prior to admission, she started having progressive symptoms with gait instability and spasticity left greater than right side. Work-up done revealing severe  cervical stenosis with myelopathy C4/5 to C6/7. She was admitted on 08/28/2019 for ACDF C4-C7 by Dr. Trenton Gammon. Postop has had weakness BLE with spasms RLE and sensory deficits around C7. Neurosurgery added steroids for postop symptoms which would gradually improve a few weeks. Patient transferred to CIR on 09/03/2019.    Patient currently requires total with basic self-care skills secondary to muscle weakness and muscle paralysis and impaired timing and sequencing, abnormal tone, unbalanced muscle activation, ataxia, decreased coordination and decreased motor planning.  Prior to hospitalization, patient could complete BADLs/IADLs with independence.  Patient will benefit from skilled intervention to decrease level of assist with basic self-care skills and increase independence with basic self-care skills prior to discharge home with family.  Anticipate patient will require 24 hour supervision and minimal physical assistance and follow up home health.   OT - End of Session Activity Tolerance: Tolerates 30+ min activity with multiple rests Endurance Deficit: Yes OT Assessment OT Barriers to Discharge: Inaccessible home environment OT Patient demonstrates impairments in the following area(s): Balance;Motor;Sensory OT Basic ADL's Functional Problem(s): Eating;Grooming;Bathing;Dressing;Toileting OT Transfers Functional Problem(s): Toilet;Tub/Shower OT Additional Impairment(s): Fuctional Use of Upper Extremity OT Plan OT Intensity: Minimum of 1-2 x/day, 45 to 90 minutes OT Frequency: 5 out of 7 days OT Duration/Estimated Length of Stay: 20-24 days OT Treatment/Interventions: Medical illustrator training;Community reintegration;Discharge planning;DME/adaptive equipment instruction;Functional mobility training;Neuromuscular re-education;Patient/family education;Psychosocial support;Self Care/advanced ADL retraining;Therapeutic Activities;Therapeutic Exercise;UE/LE Strength taining/ROM;UE/LE Coordination  activities;Wheelchair propulsion/positioning OT Self Feeding Anticipated Outcome(s): Independent OT Basic Self-Care Anticipated Outcome(s): Supervision A UB BADLs and Min A  LB BADLs OT Toileting Anticipated Outcome(s): Min A OT Bathroom Transfers Anticipated Outcome(s): Min A OT Recommendation Recommendations for Other Services: Other (comment) (TBD) Patient destination: Home Follow Up Recommendations: Home health OT Equipment Recommended: Other (comment) Equipment Details: TBD   OT Evaluation Precautions/Restrictions  Precautions Precautions: Fall Precaution Booklet Issued: Yes (comment) Required Braces or Orthoses: Cervical Brace Cervical Brace: Soft collar Restrictions Weight Bearing Restrictions: No General Chart Reviewed: Yes Family/Caregiver Present: Yes (Pt. daughter present at end of OT eval/treatment) Vital Signs  Vitals:   09/03/19 1934 09/04/19 0558  BP: (!) 128/55 (!) 143/46  Pulse: (!) 55 (!) 50  Resp: 16 14  Temp: 97.8 F (36.6 C) 97.7 F (36.5 C)  SpO2: 96% 97%    Pain Pain Assessment Pain Scale: 0-10 (Slight discomfort in buttocks while in supine.) Pain Score: 0-No pain Home Living/Prior Functioning Home Living Family/patient expects to be discharged to:: Private residence Living Arrangements: Children Available Help at Discharge: Family, Available 24 hours/day, Other (Comment) (Daughters and grandson will provide 24hr supervision/assist) Type of Home: House Home Access: Stairs to enter Technical brewer of Steps: 6-7 Entrance Stairs-Rails: Left Home Layout: One level Bathroom Shower/Tub: Gaffer, Chiropodist: Handicapped height  Lives With: Alone IADL History Homemaking Responsibilities: Yes Meal Prep Responsibility: Therapist, occupational Responsibility: Primary Cleaning Responsibility: Primary Bill Paying/Finance Responsibility: Primary Shopping Responsibility: Primary Current License: Yes Occupation:  Retired IADL Comments: Patient was independent with meal prep, homemaking tasks, and was driving. Prior Function Level of Independence: Independent with basic ADLs, Independent with homemaking with ambulation  Able to Take Stairs?: Yes Driving: Yes Vocation: Retired Leisure: Hobbies-yes (Comment) Comments: Patient enjoys reading and gardening Vision Patient Visual Report: No change from baseline Vision Assessment?: No apparent visual deficits Additional Comments: Patient able to correctly identify time on wall clock. Perception  Perception: Within Functional Limits Praxis Praxis: Intact Cognition Overall Cognitive Status: Within Functional Limits for tasks assessed Arousal/Alertness: Awake/alert Orientation Level: Person;Place;Situation Person: Oriented Place: Oriented Situation: Oriented Year: 2021 Month: August Day of Week: Correct Memory: Appears intact Immediate Memory Recall: Sock;Blue;Bed Memory Recall Sock: Without Cue Memory Recall Blue: Without Cue Memory Recall Bed: Without Cue Awareness: Appears intact Problem Solving: Appears intact Behaviors: Other (comment) Safety/Judgment: Impaired Comments: Impaired understanding of functional limitations. Sensation Sensation Light Touch: Impaired by gross assessment Proprioception: Impaired by gross assessment Stereognosis: Impaired by gross assessment Additional Comments: Impaired sensation bilaterally. Coordination Gross Motor Movements are Fluid and Coordinated: No Fine Motor Movements are Fluid and Coordinated: No Finger Nose Finger Test: Impaired bilaterally Motor  Motor Motor: Other (comment) Motor - Skilled Clinical Observations: Quadriplegic  Trunk/Postural Assessment  Cervical Assessment Cervical Assessment: Exceptions to [facility] (s/p cervical sx.) Thoracic Assessment Thoracic Assessment: Exceptions to [facility] (Kyphotic) Lumbar Assessment Lumbar Assessment: Exceptions to [facility] Postural Control Postural  Control: Deficits on evaluation Trunk Control: Mod A at best and Max A at most to maintain static sitting balance at EOB. Righting Reactions: Absent  Balance Balance Balance Assessed: Yes Static Sitting Balance Static Sitting - Balance Support: Bilateral upper extremity supported;Feet supported Static Sitting - Level of Assistance: 2: Max assist Static Sitting - Comment/# of Minutes: Patient able to maintain static sitting balance at EOB with Mod A for ~1-2 minutes progressing to Max A with fatigue. Dynamic Sitting Balance Dynamic Sitting - Level of Assistance: 1: +1 Total assist Static Standing Balance Static Standing - Level of Assistance: 1: +2 Total assist Extremity/Trunk Assessment RUE Assessment RUE Assessment: Exceptions to [facility] Passive Range of Motion (PROM)  Comments: WFL Active Range of Motion (AROM) Comments: AROM WFL at shoulder and elbow. Isolated active wrist flexion 40 degrees. Unable to extend digits with isolation without external support. Able to form weak grasp. General Strength Comments: Shoulder/elbow/wrist 3+/5 1/5 at triceps 3-/5 at digits for composite flexion. LUE Assessment LUE Assessment: Exceptions to [facility] Passive Range of Motion (PROM) Comments: WFL Active Range of Motion (AROM) Comments: AROM WFL at shoulder and elbow. Isolated active wrist flexion 40 degrees. Unable to extend digits with isolation without external support. Able to form weak grasp. General Strength Comments: Shoulder/elbow/wrist 3+/5 1/5 at triceps 3-/5 at digits for composite flexion.  Care Tool Care Tool Self Care Eating        Oral Care    Oral Care Assist Level: Minimal Assistance - Patient > 75%    Bathing   Body parts bathed by patient: Chest;Abdomen Body parts bathed by helper: Buttocks;Right lower leg;Left lower leg;Left arm;Front perineal area;Right arm   Assist Level: Total Assistance - Patient < 25%    Upper Body Dressing(including orthotics)   What is the patient  wearing?: Pull over shirt   Assist Level: Maximal Assistance - Patient 25 - 49%    Lower Body Dressing (excluding footwear)   What is the patient wearing?: Incontinence brief;Pants Assist for lower body dressing: Total Assistance - Patient < 25%    Putting on/Taking off footwear   What is the patient wearing?: Socks;Shoes Assist for footwear: Total Assistance - Patient < 25%       Care Tool Toileting Toileting activity         Care Tool Bed Mobility Roll left and right activity   Roll left and right assist level: Moderate Assistance - Patient 50 - 74%    Sit to lying activity        Lying to sitting edge of bed activity   Lying to sitting edge of bed assist level: Total Assistance - Patient < 25%     Care Tool Transfers Sit to stand transfer   Sit to stand assist level: 2 Helpers    Chair/bed transfer   Chair/bed transfer assist level: 2 Pension scheme manager transfer         Care Tool Cognition Expression of Ideas and Wants Expression of Ideas and Wants: Without difficulty (complex and basic) - expresses complex messages without difficulty and with speech that is clear and easy to understand   Understanding Verbal and Non-Verbal Content Understanding Verbal and Non-Verbal Content: Understands (complex and basic) - clear comprehension without cues or repetitions   Memory/Recall Ability *first 3 days only Memory/Recall Ability *first 3 days only: Location of own room;Staff names and faces;That he or she is in a hospital/hospital unit    Refer to Care Plan for [place] 1 OT Short Term Goal 1 (Week 1): Patient will maintain unsupported static sitting balance with Min A in prep for BADLs. OT Short Term Goal 2 (Week 1): Patient will don UB clothing with Mod A and use of compensatory technique. OT Short Term Goal 3 (Week 1): Patient will demonstrate squat-pivot transfers with Max A. OT Short Term Goal 4 (Week 1): Patient will thread 1 LE  through LB clothing in circle sitting with use of compensatory technique in prep for completion of LB BADLs.  Recommendations for other services: Other: TBD   Skilled Therapeutic Intervention Patient met lying supine in bed in agreement with OT evaluation/treatment. OT provided education on purpose  and plan for CIR including establishment of goals and ELOS. Patient/family in agreement. Patient completed bed level bathing/dressing with Max A for UB and +2 assist for LB in supported circle sitting with HOB elevated. Patient able to roll R<>L in supine with Mod A to hike pants over hips in supine. Sidelying to supine with Total A and cues for hand placement with patient able to advance RLE toward EOB but requiring Total A for LLE. Seated EOB, patient demonstrates ability to maintain static sitting balance with Mod A progress. Squat-pivot transfer to TIS wc on L with Total A +2. Patient positioned in TIS wc with chair tilted, pillow positioned behind head, and BUE positioned for increased function on pillows. Session concluded with call bell within reach and all needs met. Daughter present at bedside.   ADL ADL Upper Body Bathing: Maximal assistance Where Assessed-Upper Body Bathing: Bed level Lower Body Bathing: Dependent Where Assessed-Lower Body Bathing: Bed level Upper Body Dressing: Maximal assistance Where Assessed-Upper Body Dressing: Bed level Lower Body Dressing: Dependent Where Assessed-Lower Body Dressing: Bed level Toileting: Dependent Where Assessed-Toileting: Bed level Toilet Transfer: Unable to assess Tub/Shower Transfer: Unable to assess ADL Comments: Max A grossly for UB BADLs and Total A grossly for LB BADLs at bed level in supported long/circle sitting. Mobility  Bed Mobility Bed Mobility: Rolling Right;Rolling Left;Right Sidelying to Sit;Sitting - Scoot to [employer] of Bed Rolling Right: Moderate Assistance - Patient 50-74% Rolling Left: Moderate Assistance - Patient  50-74% Right Sidelying to Sit: Total Assistance - Patient < 25% Sitting - Scoot to Edge of Bed: Total Assistance - Patient < 25%   Discharge Criteria: Patient will be discharged from OT if patient refuses treatment 3 consecutive times without medical reason, if treatment goals not met, if there is a change in medical status, if patient makes no progress towards goals or if patient is discharged from hospital.  The above assessment, treatment plan, treatment alternatives and goals were discussed and mutually agreed upon: by patient and by family  Kirill Chatterjee R [place] 09/04/2019, 1:00 PM

## 2019-09-04 NOTE — Evaluation (Signed)
Physical Therapy Assessment and Plan  Patient Details  Name: Haley Robinson MRN: [id number] Date of Birth: 09-Dec-1940  PT Diagnosis: Abnormal posture, Difficulty walking, Impaired sensation, Muscle weakness and Quadriplegia Rehab Potential: Good ELOS: 24-28 days   Today's Date: 09/04/2019 PT Individual Time: 1345-1445 PT Individual Time Calculation (min): 60 min    Hospital Problem: Principal Problem:   Cervical myelopathy ([place]) Active Problems:   Cervical arthritis with myelopathy   Past Medical History:  Past Medical History:  Diagnosis Date  . Anxiety    w/ procedures  . Carotid bruit   . Hyperlipidemia   . Hypertension   . Numbness and tingling in both hands   . Osteoarthritis    hands  . Pneumonia    x 1 - over 30 yrs ago  . PONV (postoperative nausea and vomiting)    Nausea with hysterectomy surgery only, no problems with other procedures/surgeries  . Spinal stenosis   . Vertigo, benign positional    x 4 years  . Vitamin D deficiency    Past Surgical History:  Past Surgical History:  Procedure Laterality Date  . ABDOMINAL HYSTERECTOMY  1981  . ABDOMINAL HYSTERECTOMY    . ANTERIOR CERVICAL DECOMP/DISCECTOMY FUSION N/A 08/28/2019   Procedure: Anterior Cervical Decompression/Discectomy Fusion - Cervical four-Cervical five - Cervical five-Cervical six - Cervical six-Cervical seven;  Surgeon: Earnie Larsson, MD;  Location: [facility];  Service: Neurosurgery;  Laterality: N/A;  . CATARACT EXTRACTION, BILATERAL  11/2018  . COLONOSCOPY  06/2018  . TONSILLECTOMY    . WISDOM TOOTH EXTRACTION      Assessment & Plan Clinical Impression:  Haley Robinson. Haley Robinson is a 79 year old female with history of HTN, numbness and weakness in hands since 2000 felt to be due to cervical spinal stenosis. 1 month prior to admission, she started having progressive symptoms with gait instability and spasticity left greater than right side. Work-up done revealing severe cervical stenosis with  myelopathy C4/5 to C6/7. She was admitted on 08/28/2019 for ACDF C4-C7 by Dr. Trenton Gammon. Postop has had weakness BLE with spasms RLE and sensory deficits around C7. Neurosurgery added steroids for postop symptoms which would gradually improve a few weeks.Therapy ongoing and patient limited by quadriplegia and working on pregait activity/standing trials. CIR recommended due to functional decline. Patient transferred to CIR on 09/03/2019 .   Patient currently requires total with mobility secondary to muscle weakness and muscle paralysis, decreased cardiorespiratoy endurance, abnormal tone, unbalanced muscle activation and decreased coordination and decreased sitting balance, decreased postural control and decreased balance strategies.  Prior to hospitalization, patient was independent  with mobility and lived with Family in a House home.  Home access is 6-7Stairs to enter.  Patient will benefit from skilled PT intervention to maximize safe functional mobility, minimize fall risk and decrease caregiver burden for planned discharge home with 24 hour assist.  Anticipate patient will benefit from follow up [facility] at discharge.  PT - End of Session Activity Tolerance: Tolerates 30+ min activity with multiple rests Endurance Deficit: Yes Endurance Deficit Description: frequent rest breaks during functional activity PT Assessment Rehab Potential (ACUTE/IP ONLY): Good PT Barriers to Discharge: [place] home environment;Decreased caregiver support;Home environment access/layout;Incontinence;Neurogenic Bowel & Bladder PT Patient demonstrates impairments in the following area(s): Balance;Endurance;Motor;Pain;Safety;Sensory;Skin Integrity PT Transfers Functional Problem(s): Bed Mobility;Bed to Chair;Car;Furniture;Floor PT Locomotion Functional Problem(s): Ambulation;Wheelchair Mobility;Stairs PT Plan PT Intensity: Minimum of 1-2 x/day ,45 to 90 minutes PT Frequency: 5 out of 7 days PT Duration Estimated Length  of Stay: 24-28 days PT  Treatment/Interventions: Nurse, mental health reintegration;Discharge planning;DME/adaptive equipment instruction;Functional electrical stimulation;Functional mobility training;Neuromuscular re-education;Pain management;Patient/family education;Skin care/wound management;Therapeutic Activities;Therapeutic Exercise;UE/LE Strength taining/ROM;UE/LE Coordination activities;Wheelchair propulsion/positioning PT Transfers Anticipated Outcome(s): min A PT Locomotion Anticipated Outcome(s): min A at w/c level PT Recommendation Recommendations for Other Services: Neuropsych consult;Therapeutic Recreation consult Therapeutic Recreation Interventions: Stress management Follow Up Recommendations: Home health PT;24 hour supervision/assistance Patient destination: Home Equipment Recommended: To be determined Equipment Details: TBD pending progress   PT Evaluation Precautions/Restrictions Precautions Precautions: Fall;Cervical Precaution Booklet Issued: Yes (comment) Precaution Comments: reviewed precautions Required Braces or Orthoses: Cervical Brace Cervical Brace: Soft collar (when OOB) Restrictions Weight Bearing Restrictions: No Pain Pain Assessment Pain Scale: 0-10 (Slight discomfort in buttocks while in supine.) Pain Score: 0-No pain Home Living/Prior Functioning Home Living Available Help at Discharge: Family;Available 24 hours/day (per chart pt has 24/7 assist) Type of Home: House Home Access: Stairs to enter [employer] of Steps: 6-7 Entrance Stairs-Rails: Left Home Layout: One level Bathroom Shower/Tub: Walk-in shower;Tub/shower unit Bathroom Toilet: Handicapped height Additional Comments: pt will require ramped entry to home  Lives With: Family Prior Function Level of Independence: Independent with gait;Independent with transfers  Able to Take Stairs?: Yes Driving: Yes Vocation: Retired Leisure: Hobbies-yes  (Comment) Comments: Patient enjoys reading and gardening Vision/Perception  Vision - Assessment Additional Comments: Patient able to correctly identify time on wall clock. Perception Perception: Within Functional Limits Praxis Praxis: Intact  Cognition Overall Cognitive Status: Within Functional Limits for tasks assessed Arousal/Alertness: Awake/alert Orientation Level: Oriented X4 Attention: Focused;Sustained Focused Attention: Appears intact Sustained Attention: Appears intact Memory: Appears intact Immediate Memory Recall: Sock;Blue;Bed Memory Recall Sock: Without Cue Memory Recall Blue: Without Cue Memory Recall Bed: Without Cue Awareness: Appears intact Problem Solving: Appears intact Behaviors: Other (comment) Safety/Judgment: Appears intact Comments: Impaired understanding of functional limitations. Sensation Sensation Light Touch: Impaired Detail Light Touch Impaired Details: Impaired RLE;Impaired LLE (impaired LLE > RLE and proximally > distally) Proprioception: Impaired Detail Proprioception Impaired Details: Impaired RLE;Impaired LLE Stereognosis: Impaired by gross assessment Additional Comments: Impaired sensation bilaterally. Coordination Gross Motor Movements are Fluid and Coordinated: No Fine Motor Movements are Fluid and Coordinated: No Coordination and Movement Description: impaired 2/2 incomplete quadraplegia Finger Nose Finger Test: Impaired bilaterally Motor  Motor Motor: Tetraplegia;Abnormal tone;Abnormal postural alignment and control Motor - Skilled Clinical Observations: impaired 2/2 incomplete quadraplegia  Trunk/Postural Assessment  Cervical Assessment Cervical Assessment: Exceptions to [facility] (cervical precautions) Thoracic Assessment Thoracic Assessment: Exceptions to [facility] (kyphotic) Lumbar Assessment Lumbar Assessment: Exceptions to [facility] (posterior pelvic tilt) Postural Control Postural Control: Deficits on evaluation Trunk Control: mod to  max A sitting balance Righting Reactions: Absent  Balance Balance Balance Assessed: Yes Static Sitting Balance Static Sitting - Balance Support: Bilateral upper extremity supported;Feet supported Static Sitting - Level of Assistance: 2: Max assist Static Sitting - Comment/# of Minutes: Patient able to maintain static sitting balance at EOB with Mod A for ~1-2 minutes progressing to Max A with fatigue. Dynamic Sitting Balance Dynamic Sitting - Balance Support: No upper extremity supported;Feet supported Dynamic Sitting - Level of Assistance: 1: +2 Total assist Static Standing Balance Static Standing - Level of Assistance: 1: +2 Total assist Extremity Assessment  RUE Assessment RUE Assessment: Exceptions to [facility] Passive Range of Motion (PROM) Comments: WFL Active Range of Motion (AROM) Comments: AROM WFL at shoulder and elbow. Isolated active wrist flexion 40 degrees. Unable to extend digits with isolation without external support. Able to form weak grasp. General Strength Comments: Shoulder/elbow/wrist 3+/5 1/5 at triceps 3-/5 at digits for  composite flexion. LUE Assessment LUE Assessment: Exceptions to [facility] Passive Range of Motion (PROM) Comments: WFL Active Range of Motion (AROM) Comments: AROM WFL at shoulder and elbow. Isolated active wrist flexion 40 degrees. Unable to extend digits with isolation without external support. Able to form weak grasp. General Strength Comments: Shoulder/elbow/wrist 3+/5 1/5 at triceps 3-/5 at digits for composite flexion. RLE Assessment RLE Assessment: Exceptions to [facility] Passive Range of Motion (PROM) Comments: [facility] General Strength Comments: impaired, see below RLE Strength Right Hip Flexion: 0/5 Right Knee Flexion: 0/5 Right Knee Extension: 0/5 Right Ankle Dorsiflexion: 0/5 LLE Assessment LLE Assessment: Exceptions to [facility] Passive Range of Motion (PROM) Comments: WFL General Strength Comments: impaired, see below LLE Strength Left Hip Flexion:  1/5 Left Knee Flexion: 3+/5 Left Knee Extension: 5/5 Left Ankle Dorsiflexion: 2+/5  Care Tool Care Tool Bed Mobility Roll left and right activity   Roll left and right assist level: Moderate Assistance - Patient 50 - 74%    Sit to lying activity   Sit to lying assist level: 2 Helpers    Lying to sitting edge of bed activity   Lying to sitting edge of bed assist level: 2 Helpers     Care Tool Transfers Sit to stand transfer   Sit to stand assist level: 2 Helpers    Chair/bed transfer   Chair/bed transfer assist level: 2 Armed forces training and education officer transfer activity did not occur: Safety/medical Personal assistant transfer activity did not occur: Safety/medical concerns        Care Tool Locomotion Ambulation Ambulation activity did not occur: Safety/medical concerns        Walk 10 feet activity Walk 10 feet activity did not occur: Safety/medical concerns       Walk 50 feet with 2 turns activity Walk 50 feet with 2 turns activity did not occur: Safety/medical concerns      Walk 150 feet activity Walk 150 feet activity did not occur: Safety/medical concerns      Walk 10 feet on uneven surfaces activity Walk 10 feet on uneven surfaces activity did not occur: Safety/medical concerns      Stairs Stair activity did not occur: Safety/medical concerns        Walk up/down 1 step activity Walk up/down 1 step or curb (drop down) activity did not occur: Safety/medical concerns     Walk up/down 4 steps activity did not occuR: Safety/medical concerns  Walk up/down 4 steps activity      Walk up/down 12 steps activity Walk up/down 12 steps activity did not occur: Safety/medical concerns      Pick up small objects from floor Pick up small object from the floor (from standing position) activity did not occur: Safety/medical concerns      Wheelchair Will patient use wheelchair at discharge?: Yes Type of Wheelchair:  (TBD pending progress)   Wheelchair  assist level: Dependent - Patient 0% Max wheelchair distance: 150'  Wheel 50 feet with 2 turns activity   Assist Level: Dependent - Patient 0%  Wheel 150 feet activity   Assist Level: Dependent - Patient 0%    Refer to Care Plan for Long Term Goals  SHORT TERM GOAL WEEK 1 PT Short Term Goal 1 (Week 1): Pt will perform rolling with min A consistently PT Short Term Goal 2 (Week 1): Pt will perform bed mobility with assist x 1 consistently PT Short Term Goal 3 (Week 1): Pt will maintain static  sitting balance x 5 min with mod A  Recommendations for other services: Neuropsych and Therapeutic Recreation  Stress management  Skilled Therapeutic Intervention Evaluation completed (see details above and below) with education on PT POC and goals and individual treatment initiated with focus on functional transfer assessment, orientation to rehab unit and schedule, and initiating education with patient and her family regarding SCI. Pt received semi-reclined in TIS w/c in room, agreeable to PT eval. Pt reports urge to urinate and that she has already started urinating due to inability to control bladder. Squat pivot transfer w/c to bed with +2. Sit to supine +2 for BLE management and trunk control. Rolling L/R with mod A while clothing management performed dependently and bedpan placed. Pt unable to void once on bedpan. Pt is dependent for pericare and donning of new brief and pants. Supine to sit with +2 for BLE management and trunk control. Pt requires up to max A for static sitting balance with use of BUE for support. Squat pivot transfer back to TIS with +2 and knees blocked. Attempt sit to stand to stedy. Pt unable to achieve full upright stance due to BLE weakness and poor trunk stability, remains in flexed position with heavy reliance on proximal UE. Deferred further standing this session due to weakness and safety. Pt's daughter Olivia Mackie in room upon return. Demonstrated how to lock/unlock TIS w/c brake  and how to assist pt with reclining while in chair. Pt left semi-reclined in w/c in room with needs in reach, quick release belt and chair alarm in place, daughter present at end of session.  Mobility Bed Mobility Bed Mobility: Rolling Right;Rolling Left;Supine to Sit;Sit to Supine Rolling Right: Moderate Assistance - Patient 50-74% Rolling Left: Moderate Assistance - Patient 50-74% Right Sidelying to Sit: Total Assistance - Patient < 25% Supine to Sit: 2 Helpers Sitting - Scoot to Edge of Bed: Total Assistance - Patient < 25% Sit to Supine: 2 Helpers Transfers Transfers: Corporate treasurer Transfers: 2 Press photographer (Assistive device): None Artist / Additional Locomotion Stairs: No Wheelchair Mobility Wheelchair Mobility: No   Discharge Criteria: Patient will be discharged from PT if patient refuses treatment 3 consecutive times without medical reason, if treatment goals not met, if there is a change in medical status, if patient makes no progress towards goals or if patient is discharged from hospital.  The above assessment, treatment plan, treatment alternatives and goals were discussed and mutually agreed upon: by patient and by family   Excell Seltzer, PT, DPT 09/04/2019, 4:17 PM

## 2019-09-04 NOTE — Progress Notes (Signed)
[place] PHYSICAL MEDICINE & REHABILITATION PROGRESS NOTE   Subjective/Complaints: Had a BM with enema overnight. Discussed results of abdominal XR- showed large stool burden without obstruction.  WBC up to 13.8 today. Having urinary retention. Will order UA/UC.  Denies pain  ROS: Denies constipation, pain. +insomnia.  Objective:   DG Abd 1 View  Result Date: 09/03/2019 CLINICAL DATA:  Patient states that he has not been to the restroom in 1 week. EXAM: ABDOMEN - 1 VIEW COMPARISON:  None. FINDINGS: The bowel gas pattern is normal. A large amount of stool is seen throughout the large bowel. No radio-opaque calculi or other significant radiographic abnormality are seen. IMPRESSION: 1. Large stool burden without evidence of bowel obstruction. Electronically Signed   By: Aram Candela M.D.   On: 09/03/2019 19:28   Recent Labs    09/04/19 0613  WBC 13.8*  HGB 12.8  HCT 38.8  PLT 194   Recent Labs    09/04/19 0613  NA 139  K 3.9  CL 102  CO2 29  GLUCOSE 127*  BUN 19  CREATININE 0.77  CALCIUM 8.6*    Intake/Output Summary (Last 24 hours) at 09/04/2019 0836 Last data filed at 09/04/2019 3559 Gross per 24 hour  Intake --  Output 950 ml  Net -950 ml     Physical Exam: Vital Signs Blood pressure (!) 143/46, pulse (!) 50, temperature 97.7 F (36.5 C), temperature source Oral, resp. rate 14, SpO2 97 %.   General: Alert and oriented x 3, No apparent distress HEENT: Head is normocephalic, atraumatic, PERRLA, EOMI, sclera anicteric, oral mucosa pink and moist, dentition intact, ext ear canals clear,  Neck: Supple without JVD or lymphadenopathy Heart: Reg rate and rhythm. No murmurs rubs or gallops Chest: CTA bilaterally without wheezes, rales, or rhonchi; no distress Abdomen: Soft, non-tender, non-distended, bowel sounds positive. Extremities: No clubbing, cyanosis, or edema. Pulses are 2+ Skin: Clean and intact without signs of breakdown, multiple seborrheic keratoses on  the back Neurologic: Tone mild hyperreflexia right lower extremity no clonus at the ankles bilaterally no evidence of flexor withdrawal Motor strength 4+ bilateral deltoid bicep 4 - bilateral tricep trace finger flexors bilaterally trace finger extensors bilaterally 0 hand intrinsics bilaterally Right lower extremity to minus hip extension 0 quadriceps 0 ankle dorsiflexion plantarflexion Left lower extremity 5/5 in the hip flexor knee extensor ankle dorsiflexor and plantar flexor Musculoskeletal: No pain with active assisted as well as passive range of motion in the upper lower limb no joint swelling    Assessment/Plan: 1. Functional deficits secondary to tetraparesis secondary to cervical myelopathy which require 3+ hours per day of interdisciplinary therapy in a comprehensive inpatient rehab setting.  Physiatrist is providing close team supervision and 24 hour management of active medical problems listed below.  Physiatrist and rehab team continue to assess barriers to discharge/monitor patient progress toward functional and medical goals  Care Tool:  Bathing              Bathing assist       Upper Body Dressing/Undressing Upper body dressing        Upper body assist      Lower Body Dressing/Undressing Lower body dressing            Lower body assist       Toileting Toileting    Toileting assist Assist for toileting: Maximal Assistance - Patient 25 - 49%     Transfers Chair/bed transfer  Transfers assist  Locomotion Ambulation   Ambulation assist              Walk 10 feet activity   Assist           Walk 50 feet activity   Assist           Walk 150 feet activity   Assist           Walk 10 feet on uneven surface  activity   Assist           Wheelchair     Assist               Wheelchair 50 feet with 2 turns activity    Assist            Wheelchair 150 feet activity      Assist          Blood pressure (!) 143/46, pulse (!) 50, temperature 97.7 F (36.5 C), temperature source Oral, resp. rate 14, SpO2 97 %.  Medical Problem List and Plan: 1.   Tetraparesis secondary to cervical myelopathy -patient may  shower -ELOS/Goals: 20 to 24 days 2. Antithrombotics: -DVT/anticoagulation:Mechanical:Sequential compression devices, below kneeBilateral lower extremities -antiplatelet therapy: N/A 3. Pain Management:Hydrocodone prn 4. Mood:LCSW to follow for evaluation and support. -antipsychotic agents: N/A 5. Neuropsych: This patient is capable of making decisions on her own behalf. 6. Skin/Wound Care:Monitor wound for healing. Will add protein supplement and multivitamin to promote healing. 7. Fluids/Electrolytes/Nutrition:Monitor I/O. Check lytes in am.  8. HTN: Monitor BP tid--continue HCTZ daily.Check BMET/K+ level in am. 9. Post op labs: Pre-op K+ low normal. Will order for tomorrow. 10 Neurogenic bladder: Has been incontinent with external catheter--will monitor voiding with PVRs/bladder scan. UA UC ordered given urinary retention symptoms and increased WBC.  11.Neurogenic bowel: KUB shows large stool burden and no obstruction. Had BM with enema last night. On daily Miralax.  12. Insomnia: start Trazodone 50mg  at night.    LOS: 1 days A FACE TO FACE EVALUATION WAS PERFORMED  Chaunice Obie 09/04/2019, 8:36 AM

## 2019-09-04 NOTE — Progress Notes (Addendum)
Occupational Therapy Session Note  Patient Details  Name: Haley Robinson MRN: [id number] Date of Birth: 1940/07/13  Today's Date: 09/04/2019 OT Individual Time: 0930-1030 OT Individual Time Calculation (min): 60 min    Short Term Goals: Week 1:  OT Short Term Goal 1 (Week 1): Patient will maintain unsupported static sitting balance with Min A in prep for BADLs. OT Short Term Goal 2 (Week 1): Patient will don UB clothing with Mod A and use of compensatory technique. OT Short Term Goal 3 (Week 1): Patient will demonstrate squat-pivot transfers with Max A. OT Short Term Goal 4 (Week 1): Patient will thread 1 LE through LB clothing in circle sitting with use of compensatory technique in prep for completion of LB BADLs.  Skilled Therapeutic Interventions/Progress Updates:  Pt resting in bed upon arrival with daughter present.  OT intervention initiated after consultation with OTR regarding POC. OT intervention with focus on bed mobility, sitting balance, functional transfers, grooming, activity tolerance, and safety awareness to increase independence with BADLs. Pt able to advance LLE in bed moving towards EOB but required tot A to advance RLE. Supine>sit EOB with max A. Sitting balance with mod A in preparation for squat pivot transfer to w/c. Squat pivot tranfser with max A+2. After sitting in w/c for a brief period, pt stated she needed to urinate and returned to bed to use bed pan. Pt unable to void and returned to w/c. Pt required tot A for clothing management in bed.  Squat pivot tranfsers with max A+2. Pt brushed her teeth with min A using foam grip on tooth brush. Pt remained in w/c with all needs within reach, belt alarm activated, and daughter present.  Therapy Documentation Precautions:  Precautions Precautions: Fall Precaution Booklet Issued: Yes (comment) Required Braces or Orthoses: Cervical Brace Cervical Brace: Soft collar Restrictions Weight Bearing Restrictions:  No Pain: (Slight discomfort in buttocks while in supine.) Pt c/o ongoing sacral pain (unrated); repositioned in TIS w/c    Therapy/Group: Individual Therapy  Rich Brave 09/04/2019, 2:46 PM

## 2019-09-05 ENCOUNTER — Inpatient Hospital Stay (HOSPITAL_COMMUNITY): Payer: Medicare PPO | Admitting: Physical Therapy

## 2019-09-05 ENCOUNTER — Inpatient Hospital Stay (HOSPITAL_COMMUNITY): Payer: Medicare PPO | Admitting: Occupational Therapy

## 2019-09-05 ENCOUNTER — Inpatient Hospital Stay (HOSPITAL_COMMUNITY): Payer: Medicare PPO

## 2019-09-05 LAB — CBC WITH DIFFERENTIAL/PLATELET
Abs Immature Granulocytes: 0.09 10*3/uL — ABNORMAL HIGH (ref 0.00–0.07)
Basophils Absolute: 0 10*3/uL (ref 0.0–0.1)
Basophils Relative: 0 %
Eosinophils Absolute: 0.1 10*3/uL (ref 0.0–0.5)
Eosinophils Relative: 1 %
HCT: 37.4 % (ref 36.0–46.0)
Hemoglobin: 12.3 g/dL (ref 12.0–15.0)
Immature Granulocytes: 1 %
Lymphocytes Relative: 16 %
Lymphs Abs: 2.1 10*3/uL (ref 0.7–4.0)
MCH: 31.5 pg (ref 26.0–34.0)
MCHC: 32.9 g/dL (ref 30.0–36.0)
MCV: 95.7 fL (ref 80.0–100.0)
Monocytes Absolute: 1 10*3/uL (ref 0.1–1.0)
Monocytes Relative: 8 %
Neutro Abs: 9.5 10*3/uL — ABNORMAL HIGH (ref 1.7–7.7)
Neutrophils Relative %: 74 %
Platelets: 188 10*3/uL (ref 150–400)
RBC: 3.91 MIL/uL (ref 3.87–5.11)
RDW: 13.2 % (ref 11.5–15.5)
WBC: 12.8 10*3/uL — ABNORMAL HIGH (ref 4.0–10.5)
nRBC: 0 % (ref 0.0–0.2)

## 2019-09-05 LAB — URINE CULTURE: Culture: NO GROWTH

## 2019-09-05 MED ORDER — TRAZODONE HCL 50 MG PO TABS
100.0000 mg | ORAL_TABLET | Freq: Every day | ORAL | Status: DC
  Administered 2019-09-05 – 2019-10-02 (×28): 100 mg via ORAL
  Filled 2019-09-05 (×31): qty 2

## 2019-09-05 MED ORDER — NICOTINE POLACRILEX 2 MG MT GUM
2.0000 mg | CHEWING_GUM | OROMUCOSAL | Status: DC | PRN
  Filled 2019-09-05: qty 1

## 2019-09-05 MED ORDER — POLYETHYLENE GLYCOL 3350 17 G PO PACK
17.0000 g | PACK | Freq: Two times a day (BID) | ORAL | Status: DC
  Administered 2019-09-05 – 2019-09-21 (×32): 17 g via ORAL
  Filled 2019-09-05 (×34): qty 1

## 2019-09-05 MED ORDER — DOCUSATE SODIUM 100 MG PO CAPS
100.0000 mg | ORAL_CAPSULE | Freq: Three times a day (TID) | ORAL | Status: DC
  Administered 2019-09-05 – 2019-09-21 (×50): 100 mg via ORAL
  Filled 2019-09-05 (×50): qty 1

## 2019-09-05 NOTE — Care Management (Signed)
[facility] Individual Statement of Services  Patient Name:  Haley Robinson  Date:  09/05/2019  Welcome to the [facility].  Our goal is to provide you with an individualized program based on your diagnosis and situation, designed to meet your specific needs.  With this comprehensive rehabilitation program, you will be expected to participate in at least 3 hours of rehabilitation therapies Monday-Friday, with modified therapy programming on the weekends.  Your rehabilitation program will include the following services:  Physical Therapy (PT), Occupational Therapy (OT), 24 hour per day rehabilitation nursing, Therapeutic Recreaction (TR), Psychology, Neuropsychology, Care Coordinator, Rehabilitation Medicine, Nutrition Services, Pharmacy Services and Other  Weekly team conferences will be held on Tuesdays to discuss your progress.  Your Inpatient Rehabilitation Care Coordinator will talk with you frequently to get your input and to update you on team discussions.  Team conferences with you and your family in attendance may also be held.  Expected length of stay: 20-28 days   Overall anticipated outcome: Minimal Assistance  Depending on your progress and recovery, your program may change. Your Inpatient Rehabilitation Care Coordinator will coordinate services and will keep you informed of any changes. Your Inpatient Rehabilitation Care Coordinator's name and contact numbers are listed  below.  The following services may also be recommended but are not provided by the Inpatient Rehabilitation Center:   Driving Evaluations  Home Health Rehabiltiation Services  Outpatient Rehabilitation Services  Vocational Rehabilitation   Arrangements will be made to provide these services after discharge if needed.  Arrangements include referral to agencies that provide these services.  Your insurance has been verified to be:  [employer]  Your primary doctor  is:  Henreitta Leber  Pertinent information will be shared with your doctor and your insurance company.  Inpatient Rehabilitation Care Coordinator:  Susie Cassette [phone] or ([phone]  Information discussed with and copy given to patient by: Gretchen Short, 09/05/2019, 4:34 PM

## 2019-09-05 NOTE — Progress Notes (Signed)
[place] PHYSICAL MEDICINE & REHABILITATION PROGRESS NOTE   Subjective/Complaints: WBC decreased to 12.8 Patient says urinary frequency is improving. Has bilateral LE age determinate DVTs Still having a hard time sleeping  ROS: Denies constipation, pain. +insomnia.  Objective:   DG Abd 1 View  Result Date: 09/03/2019 CLINICAL DATA:  Patient states that he has not been to the restroom in 1 week. EXAM: ABDOMEN - 1 VIEW COMPARISON:  None. FINDINGS: The bowel gas pattern is normal. A large amount of stool is seen throughout the large bowel. No radio-opaque calculi or other significant radiographic abnormality are seen. IMPRESSION: 1. Large stool burden without evidence of bowel obstruction. Electronically Signed   By: Aram Candela M.D.   On: 09/03/2019 19:28   VAS [place] LOWER EXTREMITY VENOUS (DVT)  Result Date: 09/04/2019  Lower Venous DVTStudy Indications: Swelling, and quadrapalegia.  Risk Factors: Surgery 08/28/19. Performing Technologist: Levada Schilling RDMS, RVT  Examination Guidelines: A complete evaluation includes B-mode imaging, spectral Doppler, color Doppler, and power Doppler as needed of all accessible portions of each vessel. Bilateral testing is considered an integral part of a complete examination. Limited examinations for reoccurring indications may be performed as noted. The reflux portion of the exam is performed with the patient in reverse Trendelenburg.  +---------------+---------------+---------+-----------+----------+-------------+ RIGHT          CompressibilityPhasicitySpontaneityPropertiesThrombus                                                                  Aging         +---------------+---------------+---------+-----------+----------+-------------+ CFV            Full           Yes      Yes                                +---------------+---------------+---------+-----------+----------+-------------+ SFJ            Full                                                        +---------------+---------------+---------+-----------+----------+-------------+ FV Prox        Full                                                       +---------------+---------------+---------+-----------+----------+-------------+ FV Mid         Full                                                       +---------------+---------------+---------+-----------+----------+-------------+ FV Distal      Full                                                       +---------------+---------------+---------+-----------+----------+-------------+  PFV            Full                                                       +---------------+---------------+---------+-----------+----------+-------------+ POP            Full           Yes      Yes                                +---------------+---------------+---------+-----------+----------+-------------+ PTV            Full                                                       +---------------+---------------+---------+-----------+----------+-------------+ PERO           Full                                                       +---------------+---------------+---------+-----------+----------+-------------+ Posteriortibial                                   brightly  Age           trunk                                             echogenic Indeterminate +---------------+---------------+---------+-----------+----------+-------------+ The right popliteal vein is negative for thrombus although one branch of the trifrucation is positive for thrombus.  +---------+---------------+---------+-----------+------------+-----------------+ LEFT     CompressibilityPhasicitySpontaneityProperties  Thrombus Aging    +---------+---------------+---------+-----------+------------+-----------------+ CFV      Full           Yes      Yes                                       +---------+---------------+---------+-----------+------------+-----------------+ SFJ      Full                                                             +---------+---------------+---------+-----------+------------+-----------------+ FV Prox  Full                                                             +---------+---------------+---------+-----------+------------+-----------------+ FV Mid   Full                                                             +---------+---------------+---------+-----------+------------+-----------------+  FV DistalFull                                                             +---------+---------------+---------+-----------+------------+-----------------+ PFV      Full                                                             +---------+---------------+---------+-----------+------------+-----------------+ POP      Full           Yes      Yes                                      +---------+---------------+---------+-----------+------------+-----------------+ PTV      Full                                                             +---------+---------------+---------+-----------+------------+-----------------+ PERO     Full                                                             +---------+---------------+---------+-----------+------------+-----------------+ Gastroc  Partial                            softly      Age Indeterminate                                             echogenic                     +---------+---------------+---------+-----------+------------+-----------------+ Thrombus noted in the intermuscular veins of the proximal left calf.    Summary: RIGHT: - No cystic structure found in the popliteal fossa. - Age indetermiate thrombus noted in the right posteriortibial trunk.  LEFT: - No cystic structure found in the popliteal fossa. - Age indeterminate thrombus noted in the proximal  intermuscular veins of the left calf.  *See table(s) above for measurements and observations. Electronically signed by Waverly Ferrarihristopher Dickson MD on 09/04/2019 at 8:06:45 PM.    Final    Recent Labs    09/04/19 0613 09/05/19 0547  WBC 13.8* 12.8*  HGB 12.8 12.3  HCT 38.8 37.4  PLT 194 188   Recent Labs    09/04/19 0613  NA 139  K 3.9  CL 102  CO2 29  GLUCOSE 127*  BUN 19  CREATININE 0.77  CALCIUM 8.6*    Intake/Output Summary (Last 24 hours) at 09/05/2019 0918 Last data filed at 09/04/2019 1832 Gross per 24 hour  Intake  360 ml  Output 300 ml  Net 60 ml     Physical Exam: Vital Signs Blood pressure (!) 118/47, pulse (!) 52, temperature 98.2 F (36.8 C), resp. rate 18, height  (1.651 m), weight 61.9 kg, SpO2 93 %. General: Alert and oriented x 3, No apparent distress HEENT: Head is normocephalic, atraumatic, PERRLA, EOMI, sclera anicteric, oral mucosa pink and moist, dentition intact, ext ear canals clear,  Neck: Supple without JVD or lymphadenopathy Heart: Reg rate and rhythm. No murmurs rubs or gallops Chest: CTA bilaterally without wheezes, rales, or rhonchi; no distress Abdomen: Soft, non-tender, non-distended, bowel sounds positive. Extremities: No clubbing, cyanosis, or edema. Pulses are 2+ Skin: Clean and intact without signs of breakdown, multiple seborrheic keratoses on the back Neurologic: Tone mild hyperreflexia right lower extremity no clonus at the ankles bilaterally no evidence of flexor withdrawal Motor strength 4+ bilateral deltoid bicep 4 - bilateral tricep trace finger flexors bilaterally trace finger extensors bilaterally 0 hand intrinsics bilaterally Right lower extremity to minus hip extension 0 quadriceps 0 ankle dorsiflexion plantarflexion Left lower extremity 5/5 in the hip flexor knee extensor ankle dorsiflexor and plantar flexor Musculoskeletal: No pain with active assisted as well as passive range of motion in the upper lower limb no joint  swelling   Assessment/Plan: 1. Functional deficits secondary to tetraparesis secondary to cervical myelopathy which require 3+ hours per day of interdisciplinary therapy in a comprehensive inpatient rehab setting.  Physiatrist is providing close team supervision and 24 hour management of active medical problems listed below.  Physiatrist and rehab team continue to assess barriers to discharge/monitor patient progress toward functional and medical goals  Care Tool:  Bathing    Body parts bathed by patient: Chest, Abdomen   Body parts bathed by helper: Buttocks, Right lower leg, Left lower leg, Left arm, Front perineal area, Right arm     Bathing assist Assist Level: Total Assistance - Patient < 25%     Upper Body Dressing/Undressing Upper body dressing   What is the patient wearing?: Pull over shirt    Upper body assist Assist Level: Maximal Assistance - Patient 25 - 49%    Lower Body Dressing/Undressing Lower body dressing      What is the patient wearing?: Incontinence brief, Pants     Lower body assist Assist for lower body dressing: Total Assistance - Patient < 25%     Toileting Toileting    Toileting assist Assist for toileting: Maximal Assistance - Patient 25 - 49%     Transfers Chair/bed transfer  Transfers assist     Chair/bed transfer assist level: Dependent - mechanical lift     Locomotion Ambulation   Ambulation assist   Ambulation activity did not occur: Safety/medical concerns          Walk 10 feet activity   Assist  Walk 10 feet activity did not occur: Safety/medical concerns        Walk 50 feet activity   Assist Walk 50 feet with 2 turns activity did not occur: Safety/medical concerns         Walk 150 feet activity   Assist Walk 150 feet activity did not occur: Safety/medical concerns         Walk 10 feet on uneven surface  activity   Assist Walk 10 feet on uneven surfaces activity did not occur:  Safety/medical concerns         Wheelchair     Assist Will patient use wheelchair at discharge?: Yes Type  of Wheelchair:  (TBD pending progress)    Wheelchair assist level: Dependent - Patient 0% Max wheelchair distance: 150'    Wheelchair 50 feet with 2 turns activity    Assist        Assist Level: Dependent - Patient 0%   Wheelchair 150 feet activity     Assist      Assist Level: Dependent - Patient 0%   Blood pressure (!) 118/47, pulse (!) 52, temperature 98.2 F (36.8 C), resp. rate 18, height 5\' 5"  (1.651 m), weight 61.9 kg, SpO2 93 %.  Medical Problem List and Plan: 1.   Tetraparesis secondary to cervical myelopathy -patient may  shower -ELOS/Goals: 20 to 24 days 2. Antithrombotics: -DVT/anticoagulation:Mechanical:Sequential compression devices, below kneeBilateral lower extremities. Bilateral lower extremity DVTs, age indeterminate, on . Discussed with patient, PT, and OT that we will do room level therapies today. Pharm consult placed for anticoagulation -antiplatelet therapy: N/A 3. Pain Management:Hydrocodone prn 4. Mood:LCSW to follow for evaluation and support. -antipsychotic agents: N/A 5. Neuropsych: This patient is capable of making decisions on her own behalf. 6. Skin/Wound Care:Monitor wound for healing. Will add protein supplement and multivitamin to promote healing. 7. Fluids/Electrolytes/Nutrition:Monitor I/O. Check lytes in am.  8. HTN: Monitor BP tid--continue HCTZ daily.Check BMET/K+ level in am. 9. Post op labs: Pre-op K+ low normal.  10 Neurogenic bladder: Has been incontinent with external catheter--will monitor voiding with PVRs/bladder scan. UA UC ordered given urinary retention symptoms and increased WBC.  11.Neurogenic bowel: KUB shows large stool burden and no obstruction. Had BM with enema on admission. On daily Miralax. Will add colace TID  12. Insomnia:  Increase Trazodone to 100mg  13. Leukocytosis: WBC decreased to 12.8. Repeat CBC tomorrow. UA/UC pending.   >35 minutes spend in reviewing [place], discussing results and plan of care with patient/PT/OT/RN, reviewing WBC and checking that UA was drawn, ordering increased dose of Trazodone and repeat CBC for tomorrow, and in examination and education of patient.    LOS: 2 days A FACE TO FACE EVALUATION WAS PERFORMED  P Bufford Helms 09/05/2019, 9:18 AM

## 2019-09-05 NOTE — Plan of Care (Signed)
  Problem: Consults Goal: RH SPINAL CORD INJURY PATIENT EDUCATION Description:  See Patient Education module for education specifics.  Outcome: Progressing Goal: Skin Care Protocol Initiated - if Braden Score 18 or less Description: If consults are not indicated, leave blank or document N/A Outcome: Progressing   Problem: SCI BOWEL ELIMINATION Goal: RH STG MANAGE BOWEL WITH ASSISTANCE Description: STG Manage Bowel with mod Assistance. Outcome: Progressing Goal: RH STG SCI MANAGE BOWEL WITH MEDICATION WITH ASSISTANCE Description: STG SCI Manage bowel with medication with min/mod assistance. Outcome: Progressing   Problem: SCI BLADDER ELIMINATION Goal: RH STG MANAGE BLADDER WITH ASSISTANCE Description: STG Manage Bladder With mod Assistance Outcome: Progressing   Problem: RH SKIN INTEGRITY Goal: RH STG SKIN FREE OF INFECTION/BREAKDOWN Description: Pt will be free of skin breakdown/infection prior to DC with mod/max assist Outcome: Progressing Goal: RH STG MAINTAIN SKIN INTEGRITY WITH ASSISTANCE Description: STG Maintain Skin Integrity With mod/max Assistance. Outcome: Progressing   Problem: RH SAFETY Goal: RH STG ADHERE TO SAFETY PRECAUTIONS W/ASSISTANCE/DEVICE Description: STG Adhere to Safety Precautions With mod Assistance/Device. Outcome: Progressing

## 2019-09-05 NOTE — Progress Notes (Signed)
Patient Details  Name: Haley Robinson MRN: [id number] Date of Birth: [birth year]  Today's Date: 09/05/2019  Hospital Problems: Principal Problem:   Cervical myelopathy [facility]) Active Problems:   Cervical arthritis with myelopathy  Past Medical History:  Past Medical History:  Diagnosis Date  . Anxiety    w/ procedures  . Carotid bruit   . Hyperlipidemia   . Hypertension   . Numbness and tingling in both hands   . Osteoarthritis    hands  . Pneumonia    x 1 - over 30 yrs ago  . PONV (postoperative nausea and vomiting)    Nausea with hysterectomy surgery only, no problems with other procedures/surgeries  . Spinal stenosis   . Vertigo, benign positional    x 4 years  . Vitamin D deficiency    Past Surgical History:  Past Surgical History:  Procedure Laterality Date  . ABDOMINAL HYSTERECTOMY  1981  . ABDOMINAL HYSTERECTOMY    . ANTERIOR CERVICAL DECOMP/DISCECTOMY FUSION N/A 08/28/2019   Procedure: Anterior Cervical Decompression/Discectomy Fusion - Cervical four-Cervical five - Cervical five-Cervical six - Cervical six-Cervical seven;  Surgeon: Earnie Larsson, MD;  Location: [place] [facility];  Service: Neurosurgery;  Laterality: N/A;  . CATARACT EXTRACTION, BILATERAL  11/2018  . COLONOSCOPY  06/2018  . TONSILLECTOMY    . WISDOM TOOTH EXTRACTION     Social History:  reports that she has been smoking cigarettes. She has a 29.00 pack-year smoking history. She has never used smokeless tobacco. She reports that she does not drink alcohol and does not use drugs.  Family / Support Systems Marital Status: Widow/Widower Patient Roles: Parent Spouse/Significant Other: Widowed Children: Two adult daughters: Ebony Hail and Olivia Mackie Other Supports: None reported Anticipated Caregiver: Ebony Hail and Olivia Mackie Ability/Limitations of Caregiver: Both children work Careers adviser: 24/7 Family Dynamics: Pt daughter Ebony Hail and her husband live inthe home with patient.  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 Education: some college Read: Yes Write: Yes Employment Status: Retired Date Retired/Disabled/Unemployed: 2008 Public relations account executive Issues: Denies Guardian/Conservator: N/A   Abuse/Neglect Abuse/Neglect Assessment Can Be Completed: Yes Physical Abuse: Denies Verbal Abuse: Denies Sexual Abuse: Denies Exploitation of patient/patient's resources: Denies Self-Neglect: Denies  Emotional Status Pt's affect, behavior and adjustment status: Pt in pain at time of visit, and complained of pain, pressure in bottom, and this caused her to not sleep well last night. Recent Psychosocial Issues: situtional depression due to current medical condition Psychiatric History: Denies Substance Abuse History: Denies  Patient / Family Perceptions, Expectations & Goals Pt/Family understanding of illness & functional limitations: Pt and pt family have a general understanding of care need Premorbid pt/family roles/activities: Independent Anticipated changes in roles/activities/participation: Assistance with ADLs/IADLs Pt/family expectations/goals: Pt goal is to take of myself  [employer]: None Premorbid Home Care/DME Agencies: None Resource referrals recommended: Neuropsychology  Discharge Planning Living Arrangements: Children, Alone Support Systems: Children Type of Residence: Private residence Insurance Resources: Multimedia programmer (specify) ([employer]) Financial Resources: Social Security, Family Support Financial Screen Referred: No Living Expenses: Own Does the patient have any problems obtaining your medications?: No Home Management: Pt was managing all finances, housekeeping, lawn care, meals, etc. Patient/Family Preliminary Plans: Pt will get assistance with law care,housekeeping, and meal preps Care Coordinator  Barriers to Discharge: Decreased caregiver support, Lack of/limited family support Care Coordinator Anticipated Follow Up Needs: HH/OP Expected length of stay: 20-28 days  Clinical Impression SW met with  pt and pt dtr Ebony Hail in room to introduce self, explain role, and discuss discharge process.  Her daughter Ebony Hail works from 8:30am-4:30pm and is unsure on if she will need to take FMLA, however is considering based on her care needs and what can be managed between her and her sister. Pt is not a English as a second language teacher. HCPOA- Tracy/Allison (daughters). Has access to DME: RW and cane.   Rana Snare 09/05/2019, 4:41 PM

## 2019-09-05 NOTE — Progress Notes (Signed)
Contacted Dr. Jordan Likes regarding doppler results and clearance to start anticoagulation. Left message with staff to please call back today.

## 2019-09-05 NOTE — Progress Notes (Signed)
Physical Therapy Session Note  Patient Details  Name: Haley Robinson MRN: [id number] Date of Birth: 12/27/1940  Today's Date: 09/05/2019 PT Individual Time: 1000-1100 PT Individual Time Calculation (min): 60 min   Short Term Goals: Week 1:  PT Short Term Goal 1 (Week 1): Pt will perform rolling with min A consistently PT Short Term Goal 2 (Week 1): Pt will perform bed mobility with assist x 1 consistently PT Short Term Goal 3 (Week 1): Pt will maintain static sitting balance x 5 min with mod A  Skilled Therapeutic Interventions/Progress Updates:    Pt received seated in TIS w/c in room, agreeable to PT session. No complaints of pain. Per MD due to recent discovery of BLE DVT pt should only engage in bed-level therapies this date. Squat pivot transfer back to bed with max A +2. Sit to supine assist x 2 for BLE management and trunk control. Rolling L/R with min A to mod A for LE management with pt able to utilize BUE pulling on bedrail. Pt requires increased assist to roll onto L side vs R side. OT had provided pt with stylus with built-up handle in previous session. Pt able to utilize stylus gripping with B hands while sidelying on L side in order to press bed positioning adjustment buttons. Assisted pt with attaching stylus to L bedrail with use of theraband. Semi-reclined to long-sitting in bed with assist x 2 for trunk control and to hook BUE into bedrails to maintain position. Pt able to maintain long-sitting position 3 x 1 min before BUE fatigue and no longer able to maintain position. Semi-reclined BLE strengthening therex with AAROM LLE, no active movement felt in RLE so PROM performed: heel slides, quad sets. Assisted pt with donning RLE PRAFO and explained purpose of PRAFO. Pt left semi-reclined in bed with needs in reach, bed alarm in place, daughter present at end of session.  Therapy Documentation Precautions:  Precautions Precautions: Fall, Cervical Precaution Booklet Issued: Yes  (comment) Precaution Comments: reviewed precautions Required Braces or Orthoses: Cervical Brace Cervical Brace: Soft collar (when OOB) Restrictions Weight Bearing Restrictions: No    Therapy/Group: Individual Therapy   Peter Congo, PT, DPT  09/05/2019, 12:26 PM

## 2019-09-05 NOTE — Progress Notes (Signed)
Occupational Therapy Session Note  Patient Details  Name: Haley Robinson MRN: [id number] Date of Birth: Apr 03, 1940  Today's Date: 09/05/2019 OT Individual Time: 6767-2094 OT Individual Time Calculation (min): 72 min    Short Term Goals: Week 1:  OT Short Term Goal 1 (Week 1): Patient will maintain unsupported static sitting balance with Min A in prep for BADLs. OT Short Term Goal 2 (Week 1): Patient will don UB clothing with Mod A and use of compensatory technique. OT Short Term Goal 3 (Week 1): Patient will demonstrate squat-pivot transfers with Max A. OT Short Term Goal 4 (Week 1): Patient will thread 1 LE through LB clothing in circle sitting with use of compensatory technique in prep for completion of LB BADLs.  Skilled Therapeutic Interventions/Progress Updates:     1:1. Pt received in bed reporting increased difficulty eating d/t positioning in bed. Encouraged pt to advocate for herself to be scooted to [facility] in supine prior to elevating HOB for eating. Provided sign at [facility] for reminder for RN/NT. Pt completes bed level bathing for LB washing BLE with A for B feet and buttocks. Pt completes rolling in B directions with MOD A overall. Pt dons pants total A with pt attempting to help pull from ankle to hips. Pt completes MAX A +2 for squat pivot transfer to TIS. Pt completes UB bathing/dressing with Min-mod A overall for UB ADLs at sink. Pt provided with stylus in red foam handle to be able to change channels/bed controls with set up of remote in lap. Exited sesison with pt seated in bed, exit alarm on and call lightin reach  Therapy Documentation Precautions:  Precautions Precautions: Fall, Cervical Precaution Booklet Issued: Yes (comment) Precaution Comments: reviewed precautions Required Braces or Orthoses: Cervical Brace Cervical Brace: Soft collar (when OOB) Restrictions Weight Bearing Restrictions: No General:   Vital Signs:  Pain:   ADL: ADL Upper Body Bathing: Maximal  assistance Where Assessed-Upper Body Bathing: Bed level Lower Body Bathing: Dependent Where Assessed-Lower Body Bathing: Bed level Upper Body Dressing: Maximal assistance Where Assessed-Upper Body Dressing: Bed level Lower Body Dressing: Dependent Where Assessed-Lower Body Dressing: Bed level Toileting: Dependent Where Assessed-Toileting: Bed level Toilet Transfer: Unable to assess Tub/Shower Transfer: Unable to assess ADL Comments: Max A grossly for UB BADLs and Total A grossly for LB BADLs at bed level in supported long/circle sitting. Vision   Perception    Praxis   Exercises:   Other Treatments:     Therapy/Group: Individual Therapy  Shon Hale 09/05/2019, 9:30 AM

## 2019-09-05 NOTE — Progress Notes (Signed)
Occupational Therapy Session Note  Patient Details  Name: Haley Robinson MRN: [id number] Date of Birth: 10-11-1940  Today's Date: 09/05/2019 OT Individual Time: 8250-5397 OT Individual Time Calculation (min): 54 min    Short Term Goals: Week 1:  OT Short Term Goal 1 (Week 1): Patient will maintain unsupported static sitting balance with Min A in prep for BADLs. OT Short Term Goal 2 (Week 1): Patient will don UB clothing with Mod A and use of compensatory technique. OT Short Term Goal 3 (Week 1): Patient will demonstrate squat-pivot transfers with Max A. OT Short Term Goal 4 (Week 1): Patient will thread 1 LE through LB clothing in circle sitting with use of compensatory technique in prep for completion of LB BADLs.  Skilled Therapeutic Interventions/Progress Updates:    Pt supine in bed, complaining of significant pain in buttocks.  Nurse present and administered pain medication.  Pt agreeable to OT session in bed per MD orders secondary to DVT RLE.  Pt completed grip strengthening left hand using light resistive putty for gross grasp and modified lat pinch.  Pt completed right wrist extension and RD and forearm pro and sup using 1lb free weight with ace wrap applied to prevent dropping.  3 x 15 reps completed.  Pt needing intermittent VCs and TCs to improve positioning and controlled movement.  Pt completed left towel finger gathers x 10 each finger.  Pt complaining of worsening pain in buttocks, and reports she hasnt rolled onto her side since she woke up at 1am this morning.  Educated pt on importance of requesting to turn every 2 hours approximately to redistribute pressure.  Pt required max assist to roll to left, multiple pillows placed between BLE and behind back for support.  Pt reports reduced pain level with repositioning. Call bell in reach, bed alarm on.  Therapy Documentation Precautions:  Precautions Precautions: Fall, Cervical Precaution Booklet Issued: Yes (comment) Precaution  Comments: reviewed precautions Required Braces or Orthoses: Cervical Brace Cervical Brace: Soft collar (when OOB) Restrictions Weight Bearing Restrictions: No   Therapy/Group: Individual Therapy  Haley Robinson 09/05/2019, 3:28 PM

## 2019-09-06 ENCOUNTER — Inpatient Hospital Stay (HOSPITAL_COMMUNITY): Payer: Medicare PPO | Admitting: *Deleted

## 2019-09-06 ENCOUNTER — Inpatient Hospital Stay (HOSPITAL_COMMUNITY): Payer: Medicare PPO | Admitting: Physical Therapy

## 2019-09-06 MED ORDER — APIXABAN 5 MG PO TABS
10.0000 mg | ORAL_TABLET | Freq: Two times a day (BID) | ORAL | Status: AC
  Administered 2019-09-06 – 2019-09-13 (×14): 10 mg via ORAL
  Filled 2019-09-06 (×14): qty 2

## 2019-09-06 MED ORDER — SENNA 8.6 MG PO TABS
1.0000 | ORAL_TABLET | Freq: Every day | ORAL | Status: DC
  Administered 2019-09-06 – 2019-09-25 (×18): 8.6 mg via ORAL
  Filled 2019-09-06 (×20): qty 1

## 2019-09-06 MED ORDER — APIXABAN 5 MG PO TABS
5.0000 mg | ORAL_TABLET | Freq: Two times a day (BID) | ORAL | Status: DC
  Administered 2019-09-13 – 2019-10-03 (×40): 5 mg via ORAL
  Filled 2019-09-06 (×41): qty 1

## 2019-09-06 MED ORDER — FESOTERODINE FUMARATE ER 4 MG PO TB24
4.0000 mg | ORAL_TABLET | Freq: Every day | ORAL | Status: DC
  Administered 2019-09-06 – 2019-10-03 (×28): 4 mg via ORAL
  Filled 2019-09-06 (×28): qty 1

## 2019-09-06 NOTE — IPOC Note (Signed)
Overall Plan of Care [facility]) Patient Details Name: Haley Robinson MRN: [id number] DOB: 05/02/1940  Admitting Diagnosis: Cervical myelopathy [facility])  Hospital Problems: Principal Problem:   Cervical myelopathy (HCC) Active Problems:   Cervical arthritis with myelopathy     Functional Problem List: Nursing Bladder, Bowel, Endurance, Medication Management, Safety, Sensory, Skin Integrity  PT Balance, Endurance, Motor, Pain, Safety, Sensory, Skin Integrity  OT Balance, Motor, Sensory  SLP    TR         Basic ADL's: OT Eating, Grooming, Bathing, Dressing, Toileting     Advanced  ADL's: OT       Transfers: PT Bed Mobility, Bed to Chair, Car, Furniture, Civil Service fast streamer, Research scientist (life sciences): PT Ambulation, Psychologist, prison and probation services, Stairs     Additional Impairments: OT Fuctional Use of Upper Extremity  SLP        TR      Anticipated Outcomes Item Anticipated Outcome  Self Feeding Independent  Swallowing      Basic self-care  Supervision A UB BADLs and Min A LB BADLs  Toileting  Min A   Bathroom Transfers Min A  Bowel/Bladder  cont x 2, mod assist  Transfers  min A  Locomotion  min A at w/c level  Communication     Cognition     Pain  less than 3   Safety/Judgment  min/mod assist   Therapy Plan: PT Intensity: Minimum of 1-2 x/day ,45 to 90 minutes PT Frequency: 5 out of 7 days PT Duration Estimated Length of Stay: 24-28 days OT Intensity: Minimum of 1-2 x/day, 45 to 90 minutes OT Frequency: 5 out of 7 days OT Duration/Estimated Length of Stay: 20-24 days     Due to the current state of emergency, patients may not be receiving their 3-hours of Medicare-mandated therapy.   Team Interventions: Nursing Interventions Patient/Family Education, Bladder Management, Bowel Management, Disease Management/Prevention, Medication Management, Skin Care/Wound Management, Discharge Planning  PT interventions Balance/vestibular training, Community reintegration,  Discharge planning, DME/adaptive equipment instruction, Functional electrical stimulation, Functional mobility training, Neuromuscular re-education, Pain management, Patient/family education, Skin care/wound management, Therapeutic Activities, Therapeutic Exercise, UE/LE Strength taining/ROM, UE/LE Coordination activities, Wheelchair propulsion/positioning  OT Interventions Warden/ranger, Firefighter, Discharge planning, DME/adaptive equipment instruction, Functional mobility training, Neuromuscular re-education, Patient/family education, Psychosocial support, Self Care/advanced ADL retraining, Therapeutic Activities, Therapeutic Exercise, UE/LE Strength taining/ROM, UE/LE Coordination activities, Wheelchair propulsion/positioning  SLP Interventions    TR Interventions    SW/CM Interventions Psychosocial Support, Patient/Family Education, Discharge Planning   Barriers to Discharge MD  Medical stability  Nursing Decreased caregiver support, Incontinence, Lack of/limited family support, Inaccessible home environment    PT Inaccessible home environment, Decreased caregiver support, Home environment access/layout, Incontinence, Neurogenic Bowel & Bladder    OT Inaccessible home environment    SLP      SW Decreased caregiver support, Lack of/limited family support     Team Discharge Planning: Destination: PT-Home ,OT- Home , SLP-  Projected Follow-up: PT-Home health PT, 24 hour supervision/assistance, OT-  Home health OT, SLP-  Projected Equipment Needs: PT-To be determined, OT- Other (comment), SLP-  Equipment Details: PT-TBD pending progress, OT-TBD Patient/family involved in discharge planning: PT- Patient, Family member/caregiver,  OT-Patient, Family member/caregiver, SLP-   MD ELOS: 20-24 days Medical Rehab Prognosis:  Excellent Assessment: Haley Robinson is a 79 year old woman admitted to CIR with tetraparesis secondary to cervical myelopathy. She was found to  have bilateral age indeterminate DVTs. Permission was received from neurosurgery to  start her on anticoagulation today. Her pain has been well controlled. She has been constipated and is being managed with a bowel program. Her leukocytosis is being monitored and is trending downward. UA/UC was obtained due to urgency and was negative. Haley Robinson was started for urinary retention.     See Team Conference Notes for weekly updates to the plan of care

## 2019-09-06 NOTE — Progress Notes (Addendum)
PHYSICAL MEDICINE & REHABILITATION PROGRESS NOTE   Subjective/Complaints: Slept well last night with Trazodone. Appears alert this morning Participating well with therapy.  Have left message for Dr. Ethelene Browns office to ask about anticoagulation. Still constipated.   ROS: Denies pain. +constipation, insomnia.  Objective:   VAS [place] LOWER EXTREMITY VENOUS (DVT)  Result Date: 09/04/2019  Lower Venous DVTStudy Indications: Swelling, and quadrapalegia.  Risk Factors: Surgery 08/28/19. Performing Technologist: Levada Schilling RDMS, RVT  Examination Guidelines: A complete evaluation includes B-mode imaging, spectral Doppler, color Doppler, and power Doppler as needed of all accessible portions of each vessel. Bilateral testing is considered an integral part of a complete examination. Limited examinations for reoccurring indications may be performed as noted. The reflux portion of the exam is performed with the patient in reverse Trendelenburg.  +---------------+---------------+---------+-----------+----------+-------------+ RIGHT          CompressibilityPhasicitySpontaneityPropertiesThrombus                                                                  Aging         +---------------+---------------+---------+-----------+----------+-------------+ CFV            Full           Yes      Yes                                +---------------+---------------+---------+-----------+----------+-------------+ SFJ            Full                                                       +---------------+---------------+---------+-----------+----------+-------------+ FV Prox        Full                                                       +---------------+---------------+---------+-----------+----------+-------------+ FV Mid         Full                                                       +---------------+---------------+---------+-----------+----------+-------------+ FV  Distal      Full                                                       +---------------+---------------+---------+-----------+----------+-------------+ PFV            Full                                                       +---------------+---------------+---------+-----------+----------+-------------+  POP            Full           Yes      Yes                                +---------------+---------------+---------+-----------+----------+-------------+ PTV            Full                                                       +---------------+---------------+---------+-----------+----------+-------------+ PERO           Full                                                       +---------------+---------------+---------+-----------+----------+-------------+ Posteriortibial                                   brightly  Age           trunk                                             echogenic Indeterminate +---------------+---------------+---------+-----------+----------+-------------+ The right popliteal vein is negative for thrombus although one branch of the trifrucation is positive for thrombus.  +---------+---------------+---------+-----------+------------+-----------------+ LEFT     CompressibilityPhasicitySpontaneityProperties  Thrombus Aging    +---------+---------------+---------+-----------+------------+-----------------+ CFV      Full           Yes      Yes                                      +---------+---------------+---------+-----------+------------+-----------------+ SFJ      Full                                                             +---------+---------------+---------+-----------+------------+-----------------+ FV Prox  Full                                                             +---------+---------------+---------+-----------+------------+-----------------+ FV Mid   Full                                                              +---------+---------------+---------+-----------+------------+-----------------+ FV DistalFull                                                             +---------+---------------+---------+-----------+------------+-----------------+  PFV      Full                                                             +---------+---------------+---------+-----------+------------+-----------------+ POP      Full           Yes      Yes                                      +---------+---------------+---------+-----------+------------+-----------------+ PTV      Full                                                             +---------+---------------+---------+-----------+------------+-----------------+ PERO     Full                                                             +---------+---------------+---------+-----------+------------+-----------------+ Gastroc  Partial                            softly      Age Indeterminate                                             echogenic                     +---------+---------------+---------+-----------+------------+-----------------+ Thrombus noted in the intermuscular veins of the proximal left calf.    Summary: RIGHT: - No cystic structure found in the popliteal fossa. - Age indetermiate thrombus noted in the right posteriortibial trunk.  LEFT: - No cystic structure found in the popliteal fossa. - Age indeterminate thrombus noted in the proximal intermuscular veins of the left calf.  *See table(s) above for measurements and observations. Electronically signed by Waverly Ferrarihristopher Dickson MD on 09/04/2019 at 8:06:45 PM.    Final    Recent Labs    09/04/19 0613 09/05/19 0547  WBC 13.8* 12.8*  HGB 12.8 12.3  HCT 38.8 37.4  PLT 194 188   Recent Labs    09/04/19 0613  NA 139  K 3.9  CL 102  CO2 29  GLUCOSE 127*  BUN 19  CREATININE 0.77  CALCIUM 8.6*    Intake/Output Summary (Last 24 hours) at  09/06/2019 0837 Last data filed at 09/05/2019 1836 Gross per 24 hour  Intake 440 ml  Output --  Net 440 ml     Physical Exam: Vital Signs Blood pressure (!) 134/41, pulse (!) 51, temperature 98.1 F (36.7 C), resp. rate 16, height 5\' 5"  (1.651 m), weight 63.6 kg, SpO2 94 %. General: Alert and oriented x 3, No apparent distress HEENT: Head is normocephalic, atraumatic, PERRLA, EOMI, sclera anicteric, oral mucosa  pink and moist, dentition intact, ext ear canals clear,  Neck: Supple without JVD or lymphadenopathy Heart: Reg rate and rhythm. No murmurs rubs or gallops Chest: CTA bilaterally without wheezes, rales, or rhonchi; no distress Abdomen: Soft, non-tender, non-distended, bowel sounds positive. Extremities: No clubbing, cyanosis, or edema. Pulses are 2+ Skin: Clean and intact without signs of breakdown, multiple seborrheic keratoses on the back Neurologic: Tone mild hyperreflexia right lower extremity no clonus at the ankles bilaterally no evidence of flexor withdrawal Motor strength 4+ bilateral deltoid bicep 4 - bilateral tricep trace finger flexors bilaterally trace finger extensors bilaterally 0 hand intrinsics bilaterally Right lower extremity to minus hip extension 0 quadriceps 0 ankle dorsiflexion plantarflexion. Can wiggle toes.  Left lower extremity 5/5 in the hip flexor knee extensor ankle dorsiflexor and plantar flexor Musculoskeletal: No pain with active assisted as well as passive range of motion in the upper lower limb no joint swelling Sensation intact throughout   Assessment/Plan: 1. Functional deficits secondary to tetraparesis secondary to cervical myelopathy which require 3+ hours per day of interdisciplinary therapy in a comprehensive inpatient rehab setting.  Physiatrist is providing close team supervision and 24 hour management of active medical problems listed below.  Physiatrist and rehab team continue to assess barriers to discharge/monitor patient progress  toward functional and medical goals  Care Tool:  Bathing    Body parts bathed by patient: Chest, Abdomen   Body parts bathed by helper: Buttocks, Right lower leg, Left lower leg, Left arm, Front perineal area, Right arm     Bathing assist Assist Level: Total Assistance - Patient < 25%     Upper Body Dressing/Undressing Upper body dressing   What is the patient wearing?: Pull over shirt    Upper body assist Assist Level: Maximal Assistance - Patient 25 - 49%    Lower Body Dressing/Undressing Lower body dressing      What is the patient wearing?: Incontinence brief, Pants     Lower body assist Assist for lower body dressing: Total Assistance - Patient < 25%     Toileting Toileting    Toileting assist Assist for toileting: Maximal Assistance - Patient 25 - 49%     Transfers Chair/bed transfer  Transfers assist     Chair/bed transfer assist level: 2 Helpers     Locomotion Ambulation   Ambulation assist   Ambulation activity did not occur: Safety/medical concerns          Walk 10 feet activity   Assist  Walk 10 feet activity did not occur: Safety/medical concerns        Walk 50 feet activity   Assist Walk 50 feet with 2 turns activity did not occur: Safety/medical concerns         Walk 150 feet activity   Assist Walk 150 feet activity did not occur: Safety/medical concerns         Walk 10 feet on uneven surface  activity   Assist Walk 10 feet on uneven surfaces activity did not occur: Safety/medical concerns         Wheelchair     Assist Will patient use wheelchair at discharge?: Yes Type of Wheelchair:  (TBD pending progress)    Wheelchair assist level: Dependent - Patient 0% Max wheelchair distance: 150'    Wheelchair 50 feet with 2 turns activity    Assist        Assist Level: Dependent - Patient 0%   Wheelchair 150 feet activity     Assist  Assist Level: Dependent - Patient 0%   Blood  pressure (!) 134/41, pulse (!) 51, temperature 98.1 F (36.7 C), resp. rate 16, height  (1.651 m), weight 63.6 kg, SpO2 94 %.  Medical Problem List and Plan: 1.   Tetraparesis secondary to cervical myelopathy -patient may  shower -ELOS/Goals: 20 to 24 days  -May progress to regular therapies 2. Antithrombotics: -DVT/anticoagulation:Mechanical:Sequential compression devices, below kneeBilateral lower extremities. Bilateral lower extremity DVTs, age indeterminate, on [place]. Left message for Dr. Ethelene Browns office to check about anticoagulation.  -antiplatelet therapy: N/A 3. Pain Management:Well controlled- will discontinue hydrocodone 4. Mood:LCSW to follow for evaluation and support. -antipsychotic agents: N/A 5. Neuropsych: This patient is capable of making decisions on her own behalf. 6. Skin/Wound Care:Monitor wound for healing. Will add protein supplement and multivitamin to promote healing. 7. Fluids/Electrolytes/Nutrition:Monitor I/O. Check lytes in am.  8. HTN: Monitor BP tid--continue HCTZ daily.Check BMET/K+ level in am. 9. Post op labs: Pre-op K+ low normal.  10 Neurogenic bladder: Has been incontinent with external catheter--will monitor voiding with PVRs/bladder scan. UA negative. Start Toviaz  daily 11.Neurogenic bowel: KUB shows large stool burden and no obstruction. Had BM with enema on admission. On daily Miralax, colace TID. Will add Senna HS 12. Insomnia: ContinueTrazodone to  13. Leukocytosis: WBC decreased to 12.8. Repeat CBC tomorrow. UA/UC negative.     LOS: 3 days A FACE TO FACE EVALUATION WAS PERFORMED  Horton Chin 09/06/2019, 8:37 AM

## 2019-09-06 NOTE — Progress Notes (Addendum)
Attempted to disimpact pt, small hard lumpy stool pieces felt in rectal vault, unable to manually remove stool. Administered dulcolax suppository at 1850. Awaiting result. Pam PA notified. Order received for soap suds and bowel program. Will report off to oncoming staff. Continue plan of care.    Marylu Lund, RN

## 2019-09-06 NOTE — Progress Notes (Signed)
Physical Therapy Session Note  Patient Details  Name: Haley Robinson MRN: [id number] Date of Birth: 1940/11/19  Today's Date: 09/06/2019 PT Individual Time: 1000-1110 PT Individual Time Calculation (min): 70 min   Short Term Goals: Week 1:  PT Short Term Goal 1 (Week 1): Pt will perform rolling with min A consistently PT Short Term Goal 2 (Week 1): Pt will perform bed mobility with assist x 1 consistently PT Short Term Goal 3 (Week 1): Pt will maintain static sitting balance x 5 min with mod A  Skilled Therapeutic Interventions/Progress Updates:    Pt received seated in TIS w/c in room, agreeable to PT session. No complaints of pain. Attempt to adjust headrest for improved patient fit, unable to adjust due to stripped screw. Head able to be positioned comfortably with use of pillows. Assisted pt with anterior and lateral leans in order to place standing frame sling. Sit to stand in standing frame with assist x 2 for UE management and trunk control. Pt unable to hold trunk upright initially and rests her head on pillow on standing frame platform. Decreased assist from sling and with assist x 2 and BUE support on standing frame platform pt able to maintain upright trunk and stance. Pt reports feeling short of air while standing, returned to sitting. Pt reports improvement in breathing once seated in chair. Pt then reports urge to urinate. Attempt to perform sit to stand from TIS to place bedpan due to pt reports of difficulty urinating on bedpan in bed. Pt unable to clear buttocks for clothing management, deferred this method of toileting. Squat pivot transfer back to bed with max A x 1 and min A x 1. Sit to supine assist x 2 for trunk control and BLE management. Rolling L/R with mod A for placement of bedpan. Pt unable to void once bedpan placed so assist pt with donning new brief and pants. Pt then has urinary incontinence. Pt is dependent for brief change and pericare. Squat pivot transfer back to w/c  with assist x 2. Provided built-up cushioning on w/c leg rests for improved skin protection while seated in chair. Pt left semi-reclined in w/c with quick release belt and chair alarm in place, needs in reach at end of session.  Therapy Documentation Precautions:  Precautions Precautions: Fall, Cervical Precaution Booklet Issued: Yes (comment) Precaution Comments: reviewed precautions Required Braces or Orthoses: Cervical Brace Cervical Brace: Soft collar (when OOB) Restrictions Weight Bearing Restrictions: No    Therapy/Group: Individual Therapy   Peter Congo, PT, DPT  09/06/2019, 4:04 PM

## 2019-09-06 NOTE — Progress Notes (Signed)
Occupational Therapy Session Note  Patient Details  Name: Haley Robinson MRN: [id number] Date of Birth: 1940/09/17  Today's Date: 09/06/2019 OT Individual Time: 6834-1962 OT Individual Time Calculation (min): 72 min    Short Term Goals: Week 1:  OT Short Term Goal 1 (Week 1): Patient will maintain unsupported static sitting balance with Min A in prep for BADLs. OT Short Term Goal 2 (Week 1): Patient will don UB clothing with Mod A and use of compensatory technique. OT Short Term Goal 3 (Week 1): Patient will demonstrate squat-pivot transfers with Max A. OT Short Term Goal 4 (Week 1): Patient will thread 1 LE through LB clothing in circle sitting with use of compensatory technique in prep for completion of LB BADLs.  Skilled Therapeutic Interventions/Progress Updates:    OT intervention with focus on bed mobility, BADL retraining at bed level and sitting in w/c, sitting balance, functional transfers and activity tolerance to increase independence with BADLs. Pt stated she thought she needed to urinate and bed pan placed. Pt required mod A for rolling for bed pan placement and during LB dressing tasks.  Pt unable to void. Pt completed bathing front perineal area and B upper legs.  Pt required tot A for LB dressing tasks at bed level. Supine>sit EOB with max A and mod A for sitting balance in preparation for squat pivot transfer to w/c. Pt required max A+2 for squat pivot transfer to w/c and tot A for repositioning in w/c.  Pt completed UB bathing tasks seated in w/c with setup. Pt required max A for UB dressing tasks.  Pt brushed teeth with setup assistance. Pt remained in w/c with all needs within reach and belt alarm activated.   Therapy Documentation Precautions:  Precautions Precautions: Fall, Cervical Precaution Booklet Issued: Yes (comment) Precaution Comments: reviewed precautions Required Braces or Orthoses: Cervical Brace Cervical Brace: Soft collar (when OOB) Restrictions Weight  Bearing Restrictions: No  Pain:  Pt denied pain this morning   Therapy/Group: Individual Therapy  Rich Brave 09/06/2019, 9:30 AM

## 2019-09-06 NOTE — Progress Notes (Signed)
Occupational Therapy Session Note  Patient Details  Name: Haley Robinson MRN: [id number] Date of Birth: Jun 20, 1940  Today's Date: 09/06/2019 OT Individual Time: 1345-1430 OT Individual Time Calculation (min): 45 min    Short Term Goals: Week 1:  OT Short Term Goal 1 (Week 1): Patient will maintain unsupported static sitting balance with Min A in prep for BADLs. OT Short Term Goal 2 (Week 1): Patient will don UB clothing with Mod A and use of compensatory technique. OT Short Term Goal 3 (Week 1): Patient will demonstrate squat-pivot transfers with Max A. OT Short Term Goal 4 (Week 1): Patient will thread 1 LE through LB clothing in circle sitting with use of compensatory technique in prep for completion of LB BADLs.  Skilled Therapeutic Interventions/Progress Updates:    Pt resting in w/c upon arrival with daughter Haley Robinson) present. OT intervention with focus on trunk control and core strengthening in unsupported sitting.  Pt asymmetric when sitting in w/c with shortening of trunk on L and weight shifts onto R hip. Sitting unsupported, pt challenged with reaching to L to lengthen trunk and engage core muscle group. Pt required mod A for balance when completing tasks.  Pt also challenged with reaching anteriorly and to L to lengthen trunk and strengthen core muscles. Pt required min A during activity. Pt returned to room and remained in w/c with all needs within reach and belt alarm activated. Daughter present.  Therapy Documentation Precautions:  Precautions Precautions: Fall, Cervical Precaution Booklet Issued: Yes (comment) Precaution Comments: reviewed precautions Required Braces or Orthoses: Cervical Brace Cervical Brace: Soft collar (when OOB) Restrictions Weight Bearing Restrictions: No Pain:  Pt denies pain this afternoon   Therapy/Group: Individual Therapy  Rich Brave 09/06/2019, 2:48 PM

## 2019-09-06 NOTE — Plan of Care (Signed)
  Problem: Consults Goal: RH SPINAL CORD INJURY PATIENT EDUCATION Description:  See Patient Education module for education specifics.  Outcome: Progressing Goal: Skin Care Protocol Initiated - if Braden Score 18 or less Description: If consults are not indicated, leave blank or document N/A Outcome: Progressing   Problem: SCI BOWEL ELIMINATION Goal: RH STG MANAGE BOWEL WITH ASSISTANCE Description: STG Manage Bowel with mod Assistance. Outcome: Progressing Goal: RH STG SCI MANAGE BOWEL WITH MEDICATION WITH ASSISTANCE Description: STG SCI Manage bowel with medication with min/mod assistance. Outcome: Progressing   Problem: SCI BLADDER ELIMINATION Goal: RH STG MANAGE BLADDER WITH ASSISTANCE Description: STG Manage Bladder With mod Assistance Outcome: Progressing   Problem: RH SKIN INTEGRITY Goal: RH STG SKIN FREE OF INFECTION/BREAKDOWN Description: Pt will be free of skin breakdown/infection prior to DC with mod/max assist Outcome: Progressing Goal: RH STG MAINTAIN SKIN INTEGRITY WITH ASSISTANCE Description: STG Maintain Skin Integrity With mod/max Assistance. Outcome: Progressing   Problem: RH SAFETY Goal: RH STG ADHERE TO SAFETY PRECAUTIONS W/ASSISTANCE/DEVICE Description: STG Adhere to Safety Precautions With cues and reminders Assistance/Device. Outcome: Progressing   

## 2019-09-07 ENCOUNTER — Inpatient Hospital Stay (HOSPITAL_COMMUNITY): Payer: Medicare PPO | Admitting: Physical Therapy

## 2019-09-07 ENCOUNTER — Inpatient Hospital Stay (HOSPITAL_COMMUNITY): Payer: Medicare PPO | Admitting: Occupational Therapy

## 2019-09-07 LAB — CBC WITH DIFFERENTIAL/PLATELET
Abs Immature Granulocytes: 0.09 10*3/uL — ABNORMAL HIGH (ref 0.00–0.07)
Basophils Absolute: 0 10*3/uL (ref 0.0–0.1)
Basophils Relative: 0 %
Eosinophils Absolute: 0.1 10*3/uL (ref 0.0–0.5)
Eosinophils Relative: 1 %
HCT: 38.4 % (ref 36.0–46.0)
Hemoglobin: 12.7 g/dL (ref 12.0–15.0)
Immature Granulocytes: 1 %
Lymphocytes Relative: 16 %
Lymphs Abs: 1.9 10*3/uL (ref 0.7–4.0)
MCH: 31.9 pg (ref 26.0–34.0)
MCHC: 33.1 g/dL (ref 30.0–36.0)
MCV: 96.5 fL (ref 80.0–100.0)
Monocytes Absolute: 1.2 10*3/uL — ABNORMAL HIGH (ref 0.1–1.0)
Monocytes Relative: 9 %
Neutro Abs: 9.1 10*3/uL — ABNORMAL HIGH (ref 1.7–7.7)
Neutrophils Relative %: 73 %
Platelets: 254 10*3/uL (ref 150–400)
RBC: 3.98 MIL/uL (ref 3.87–5.11)
RDW: 13 % (ref 11.5–15.5)
WBC: 12.4 10*3/uL — ABNORMAL HIGH (ref 4.0–10.5)
nRBC: 0 % (ref 0.0–0.2)

## 2019-09-07 MED ORDER — MAGNESIUM CITRATE PO SOLN
1.0000 | Freq: Once | ORAL | Status: AC
  Administered 2019-09-07: 1 via ORAL
  Filled 2019-09-07: qty 296

## 2019-09-07 MED ORDER — AMITRIPTYLINE HCL 50 MG PO TABS
25.0000 mg | ORAL_TABLET | Freq: Every day | ORAL | Status: DC
  Administered 2019-09-07 – 2019-10-02 (×26): 25 mg via ORAL
  Filled 2019-09-07 (×28): qty 1

## 2019-09-07 NOTE — Plan of Care (Signed)
  Problem: Consults Goal: RH SPINAL CORD INJURY PATIENT EDUCATION Description:  See Patient Education module for education specifics.  Outcome: Progressing Goal: Skin Care Protocol Initiated - if Braden Score 18 or less Description: If consults are not indicated, leave blank or document N/A Outcome: Progressing   Problem: SCI BOWEL ELIMINATION Goal: RH STG MANAGE BOWEL WITH ASSISTANCE Description: STG Manage Bowel with mod Assistance. Outcome: Progressing Goal: RH STG SCI MANAGE BOWEL WITH MEDICATION WITH ASSISTANCE Description: STG SCI Manage bowel with medication with min/mod assistance. Outcome: Progressing   Problem: SCI BLADDER ELIMINATION Goal: RH STG MANAGE BLADDER WITH ASSISTANCE Description: STG Manage Bladder With mod Assistance Outcome: Progressing   Problem: RH SKIN INTEGRITY Goal: RH STG SKIN FREE OF INFECTION/BREAKDOWN Description: Pt will be free of skin breakdown/infection prior to DC with mod/max assist Outcome: Progressing Goal: RH STG MAINTAIN SKIN INTEGRITY WITH ASSISTANCE Description: STG Maintain Skin Integrity With mod/max Assistance. Outcome: Progressing   Problem: RH SAFETY Goal: RH STG ADHERE TO SAFETY PRECAUTIONS W/ASSISTANCE/DEVICE Description: STG Adhere to Safety Precautions With cues and reminders Assistance/Device. Outcome: Progressing   

## 2019-09-07 NOTE — Progress Notes (Signed)
Soap suds enema administered at 1805 assisted pt onto bedpan. Pt had a large BM after soap suds. Reported off to oncoming staff to follow up.   Marylu Lund, RN

## 2019-09-07 NOTE — Progress Notes (Signed)
Physical Therapy Session Note  Patient Details  Name: Haley Robinson MRN: [id number] Date of Birth: 11/12/1940  Today's Date: 09/07/2019 PT Individual Time: 6010-9323 PT Individual Time Calculation (min): 30 min   Short Term Goals: Week 1:  PT Short Term Goal 1 (Week 1): Pt will perform rolling with min A consistently PT Short Term Goal 2 (Week 1): Pt will perform bed mobility with assist x 1 consistently PT Short Term Goal 3 (Week 1): Pt will maintain static sitting balance x 5 min with mod A  Skilled Therapeutic Interventions/Progress Updates:    Pt received sitting in w/c with her family present and pt agreeable to therapy session. Pt wearing soft cervical collar throughout session. Pt reports recently receiving laxative but no known BM yet - no indications of BM.  Transported to/from gym in w/c for time management and energy conservation. Pt unable to lift R LE from leg rest but able to manage L LE off leg rest. R squat pivot w/c>EOM with max assist of 1 person for trunk control and lifting/pivoting hips; manual facilitation for R LE alignment during transfer and cuing for UE placement to assist with trunk control - +2 present for safety.  Sitting EOM participated in the following trunk control/alignment tasks: - static sitting with B UE support focusing on improved trunk alignment as pt rests in posterior pelvic tilt - manual facilitation for anterior pelvic tilt and increased thoracic extension - pt has difficulty activating scapular depressor muscles despite multimodal cuing  - placed a sheet around pt to facilitate anterior pelvic tilt and to allow therapist additional hands on assistance for posture - progressed to R/L UE reaches overhead while sustaining anterior pelvic tilt  - R/L lateral trunk leans on forearm support with mod/max assist to return to midline L squat pivot EOM>w/c with max assist of 1 for lifting/pivoting hips and trunk control - manual facilitation for R LE management  - +2 assist present for safety. Transported back to room and pt requesting to return to bed in event of BM and for assist with bladder management. R squat pivot max assist of 1 and +2 for safety - same cuing and assist as above. Sit>supine via reverse logroll technique with max assist for B LE management into bed and pt able to use UEs to assist with trunk descent. Pt left supine in bed with needs in reach, bed alarm on, and NT notified to assist with toileting.  Therapy Documentation Precautions:  Precautions Precautions: Fall, Cervical Precaution Booklet Issued: Yes (comment) Precaution Comments: reviewed precautions Required Braces or Orthoses: Cervical Brace Cervical Brace: Soft collar (when OOB) Restrictions Weight Bearing Restrictions: No  Pain:   Denies pain during session.   Therapy/Group: Individual Therapy  Ginny Forth , PT, DPT, CSRS  09/07/2019, 4:02 PM

## 2019-09-07 NOTE — Progress Notes (Signed)
Occupational Therapy Session Note  Patient Details  Name: Haley Robinson MRN: [id number] Date of Birth: 02/18/1940  Today's Date: 09/07/2019 OT Individual Time: 1000-1100 OT Individual Time Calculation (min): 60 min    Short Term Goals: Week 1:  OT Short Term Goal 1 (Week 1): Patient will maintain unsupported static sitting balance with Min A in prep for BADLs. OT Short Term Goal 2 (Week 1): Patient will don UB clothing with Mod A and use of compensatory technique. OT Short Term Goal 3 (Week 1): Patient will demonstrate squat-pivot transfers with Max A. OT Short Term Goal 4 (Week 1): Patient will thread 1 LE through LB clothing in circle sitting with use of compensatory technique in prep for completion of LB BADLs.  Skilled Therapeutic Interventions/Progress Updates:    OT intervention with focus on squat pivot transfers, unsupported sitting balance, and core strengthening/control.  Squat pivot transfers with tot A +1. Pt requires min A for unsupported static sitting balance and mod/max A for dynamic sitting balance during lateral reaching activities and anterior reaching tasks.  Pt able to maintain sitting balance with CGA and use of BUE in extended position. Lateral leans on therapy ball with min A. Lateral leans on elevated surface with mod A and lateral leans on mat with max A with increased use of UE to push up. Pt is able to initiate forward lean with BUE support but lacks adequate concentric core activation to control return to neutral. Pt returned to room and remained seated in w/c with all needs within reach and belt alarm activated.   Therapy Documentation Precautions:  Precautions Precautions: Fall, Cervical Precaution Booklet Issued: Yes (comment) Precaution Comments: reviewed precautions Required Braces or Orthoses: Cervical Brace Cervical Brace: Soft collar (when OOB) Restrictions Weight Bearing Restrictions: No  Pain: Pain Assessment Pain Scale: 0-10 Pain Score: 4   Pain Type: Chronic pain Pain Location: Arm Pain Orientation:  (Bilateral) Pain Descriptors / Indicators: Aching Pain Frequency: Intermittent Pain Onset: On-going Patients Stated Pain Goal: 2 Pain Intervention(s): RN aware and meds admin during session  Therapy/Group: Individual Therapy  Rich Brave 09/07/2019, 11:39 AM

## 2019-09-07 NOTE — Progress Notes (Signed)
Occupational Therapy Session Note  Patient Details  Name: TEJASVI BRISSETT MRN: [id number] Date of Birth: [birth year]  Today's Date: 09/07/2019 OT Individual Time: 0815-0900 OT Individual Time Calculation (min): 45 min    Short Term Goals: Week 1:  OT Short Term Goal 1 (Week 1): Patient will maintain unsupported static sitting balance with Min A in prep for BADLs. OT Short Term Goal 2 (Week 1): Patient will don UB clothing with Mod A and use of compensatory technique. OT Short Term Goal 3 (Week 1): Patient will demonstrate squat-pivot transfers with Max A. OT Short Term Goal 4 (Week 1): Patient will thread 1 LE through LB clothing in circle sitting with use of compensatory technique in prep for completion of LB BADLs.  Skilled Therapeutic Interventions/Progress Updates:    OT intervention with focus on LB bathing/dressing at bed level and UB bathing/dressing at w/c level. LB bathing with HOB elevated for support.  Pt requires mod A for sitting balance when leaning forward to reach lower legs. Pt requires mod A for rolling R/L using bed rails to facilitated pulling pants over hips.  Pt max/tot A for LB dressing tasks. Supine>sit EOB with max A. Squat pivot transfer to w/c with max A+2. Pt completed UB bathing and grooming tasks at sink with supervision/setup. Pt able to don pullover shirt with min A this morning, requiring assistance to pull over trunk. Pt remained seated in w/c with all needs within reach and belt alarm activated.   Therapy Documentation Precautions:  Precautions Precautions: Fall, Cervical Precaution Booklet Issued: Yes (comment) Precaution Comments: reviewed precautions Required Braces or Orthoses: Cervical Brace Cervical Brace: Soft collar (when OOB) Restrictions Weight Bearing Restrictions: No  Pain:  Pt reports that her "shoulders" were a "little painful" and she didn't sleep well during night; MD aware and repositioned   Therapy/Group: Individual  Therapy  Rich Brave 09/07/2019, 9:58 AM

## 2019-09-07 NOTE — Progress Notes (Signed)
Patient apparently did not have any results with enema on 08/04 pm. KUB showed large amount of stool thorough out the colon and was anticipating that increase in miralax as well as enema X 2 would have good results. SSE X 2 days ordered--she had moderate results on 08/05 pm per recors review. Also note black stools reported. Will order CBC for follow up. Will also write for Mag citrate today with SSE this evening in anticipation of decreasing stool burden and will start daily bowel program tomorrow

## 2019-09-07 NOTE — Progress Notes (Signed)
[facility] PHYSICAL MEDICINE & REHABILITATION PROGRESS NOTE   Subjective/Complaints: Patient slept poorly last night despite Trazodone 100mg , which had helped well the previous night. Discussed trying Amitriptyline 25mg  tonight instead, advised that it may make her more groggy in the morning.   ROS: Denies pain. +constipation, insomnia.  Objective:   No results found. Recent Labs    09/05/19 0547  WBC 12.8*  HGB 12.3  HCT 37.4  PLT 188   No results for input(s): NA, K, CL, CO2, GLUCOSE, BUN, CREATININE, CALCIUM in the last 72 hours.  Intake/Output Summary (Last 24 hours) at 09/07/2019 0943 Last data filed at 09/07/2019 11/07/2019 Gross per 24 hour  Intake 620 ml  Output --  Net 620 ml     Physical Exam: Vital Signs Blood pressure (!) 130/48, pulse (!) 53, temperature 97.7 F (36.5 C), resp. rate 18, height 5\' 5"  (1.651 m), weight 63.6 kg, SpO2 96 %. General: Alert and oriented x 3, No apparent distress HEENT: Head is normocephalic, atraumatic, PERRLA, EOMI, sclera anicteric, oral mucosa pink and moist, dentition intact, ext ear canals clear,  Neck: Supple without JVD or lymphadenopathy Heart: Reg rate and rhythm. No murmurs rubs or gallops Chest: CTA bilaterally without wheezes, rales, or rhonchi; no distress Abdomen: Soft, non-tender, non-distended, bowel sounds positive. Extremities: No clubbing, cyanosis, or edema. Pulses are 2+ Skin: Clean and intact without signs of breakdown, multiple seborrheic keratoses on the back Neurologic: Tone mild hyperreflexia right lower extremity no clonus at the ankles bilaterally no evidence of flexor withdrawal Motor strength 4+ bilateral deltoid bicep 4 - bilateral tricep trace finger flexors bilaterally trace finger extensors bilaterally 0 hand intrinsics bilaterally Right lower extremity to minus hip extension 0 quadriceps 0 ankle dorsiflexion plantarflexion. Can wiggle toes.  Left lower extremity 5/5 in the hip flexor knee extensor ankle  dorsiflexor and plantar flexor Musculoskeletal: No pain with active assisted as well as passive range of motion in the upper lower limb no joint swelling Sensation intact throughout  Assessment/Plan: 1. Functional deficits secondary to tetraparesis secondary to cervical myelopathy which require 3+ hours per day of interdisciplinary therapy in a comprehensive inpatient rehab setting.  Physiatrist is providing close team supervision and 24 hour management of active medical problems listed below.  Physiatrist and rehab team continue to assess barriers to discharge/monitor patient progress toward functional and medical goals  Care Tool:  Bathing    Body parts bathed by patient: Right arm, Left arm, Chest, Abdomen, Front perineal area, Right upper leg, Left upper leg, Face (bed level)   Body parts bathed by helper: Buttocks, Right lower leg, Left lower leg     Bathing assist Assist Level: Moderate Assistance - Patient 50 - 74%     Upper Body Dressing/Undressing Upper body dressing   What is the patient wearing?: Pull over shirt    Upper body assist Assist Level: Maximal Assistance - Patient 25 - 49%    Lower Body Dressing/Undressing Lower body dressing      What is the patient wearing?: Incontinence brief     Lower body assist Assist for lower body dressing: Maximal Assistance - Patient 25 - 49%     Toileting Toileting    Toileting assist Assist for toileting: Maximal Assistance - Patient 25 - 49%     Transfers Chair/bed transfer  Transfers assist     Chair/bed transfer assist level: 2 Helpers     Locomotion Ambulation   Ambulation assist   Ambulation activity did not occur: Safety/medical concerns  Walk 10 feet activity   Assist  Walk 10 feet activity did not occur: Safety/medical concerns        Walk 50 feet activity   Assist Walk 50 feet with 2 turns activity did not occur: Safety/medical concerns         Walk 150 feet  activity   Assist Walk 150 feet activity did not occur: Safety/medical concerns         Walk 10 feet on uneven surface  activity   Assist Walk 10 feet on uneven surfaces activity did not occur: Safety/medical concerns         Wheelchair     Assist Will patient use wheelchair at discharge?: Yes Type of Wheelchair:  (TBD pending progress)    Wheelchair assist level: Dependent - Patient 0% Max wheelchair distance: 150'    Wheelchair 50 feet with 2 turns activity    Assist        Assist Level: Dependent - Patient 0%   Wheelchair 150 feet activity     Assist      Assist Level: Dependent - Patient 0%   Blood pressure (!) 130/48, pulse (!) 53, temperature 97.7 F (36.5 C), resp. rate 18, height 5\' 5"  (1.651 m), weight 63.6 kg, SpO2 96 %.  Medical Problem List and Plan: 1.   Tetraparesis secondary to cervical myelopathy -patient may  shower -ELOS/Goals: 20 to 24 days  -Continue CIR 2. Antithrombotics: -DVT/anticoagulation:Mechanical:Sequential compression devices, below kneeBilateral lower extremities. Bilateral lower extremity DVTs, age indeterminate, on . Started on Eliquis.  -antiplatelet therapy: N/A 3. Pain Management:Well controlled with prn Tylenol 4. Mood:LCSW to follow for evaluation and support. -antipsychotic agents: N/A 5. Neuropsych: This patient is capable of making decisions on her own behalf. 6. Skin/Wound Care:Monitor wound for healing. Will add protein supplement and multivitamin to promote healing. 7. Fluids/Electrolytes/Nutrition:Monitor I/O. Check lytes in am.  8. HTN: Monitor BP tid--continue HCTZ daily.Check BMET/K+ level in am. 9. Post op labs: Pre-op K+ low normal.  10 Neurogenic bladder: Has been incontinent with external catheter--will monitor voiding with PVRs/bladder scan. UA negative. Continue Toviaz 4mg  daily 11.Neurogenic bowel: KUB shows large stool  burden and no obstruction. Had BM with enema on admission. On daily Miralax, colace TID, Senna HS. Mag citrate x1 on 8/6 12. Insomnia: Trazodone 100mg  did not provide benefit last night. Try Amitriptyline 25mg .  13. Leukocytosis: WBC decreased to 12.4. UA/UC negative.     LOS: 4 days A FACE TO FACE EVALUATION WAS PERFORMED  US Traeson Dusza 09/07/2019, 9:43 AM

## 2019-09-07 NOTE — Progress Notes (Signed)
Physical Therapy Session Note  Patient Details  Name: Haley Robinson MRN: [id number] Date of Birth: [birth year]  Today's Date: 09/07/2019 PT Individual Time: 1330-1430 PT Individual Time Calculation (min): 60 min  PT Amount of Missed Time (min): 15 Minutes PT Missed Treatment Reason: Patient fatigue  Short Term Goals: Week 1:  PT Short Term Goal 1 (Week 1): Pt will perform rolling with min A consistently PT Short Term Goal 2 (Week 1): Pt will perform bed mobility with assist x 1 consistently PT Short Term Goal 3 (Week 1): Pt will maintain static sitting balance x 5 min with mod A  Skilled Therapeutic Interventions/Progress Updates:    Pt received seated in TIS w/c in room, agreeable to PT session. Pt thought to be incontinent of bowel. Squat pivot transfer back to bed with assist x 2 for safety. Sit to supine assist x 2 for trunk control and BLE management. Rolling L/R with mod A for dependent brief change and pericare following urinary incontinence. Supine to sit assist x 2 for trunk control and BLE management. Squat pivot transfer back to w/c with assist x 2. Introduced Film/video editor transfer this session. Slide board transfer w/c to mat table with assist x 2 for safety. Pt exhibits improvement in safety and balance with use of SB vs squat pivot. Remainder of session focus on sitting balance EOM with use of mirror for visual feedback and one therapist sitting behind patient on therapy ball to assist with balance. Seated alt UE reaching outside BOS and across midline with min to mod A x 1-2 for trunk control. Pt exhibits decreased grasp with RUE with onset of fatigue as well as ataxic limbs. Seated mini-crunches from therapy ball, 2 x 5 reps with min to mod A. Pt exhibits onset of R lateral lean with onset of fatigue. Seated lateral leans x 5 reps each direction with mod to max A to return to midline. Pt exhibits onset of R side trunk collapse with onset of fatigue. Placed wedge under R hip and pt  exhibits improved upright posture but reports onset of fatigue and that she feels exhausted. Slide board transfer back to w/c with assist x 2. Pt left semi-reclined in w/c in room with needs in reach, quick release belt and chair alarm in place at end of session. Pt missed 15 min of scheduled therapy session due to fatigue.  Therapy Documentation Precautions:  Precautions Precautions: Fall, Cervical Precaution Booklet Issued: Yes (comment) Precaution Comments: reviewed precautions Required Braces or Orthoses: Cervical Brace Cervical Brace: Soft collar (when OOB) Restrictions Weight Bearing Restrictions: No   Therapy/Group: Individual Therapy   Peter Congo, PT, DPT  09/07/2019, 2:37 PM

## 2019-09-08 NOTE — Progress Notes (Signed)
PHYSICAL MEDICINE & REHABILITATION PROGRESS NOTE   Subjective/Complaints: No issues overnite , pt without pain  ROS: Denies pain. +constipation, insomnia.  Objective:   No results found. Recent Labs    09/07/19 1110  WBC 12.4*  HGB 12.7  HCT 38.4  PLT 254   No results for input(s): NA, K, CL, CO2, GLUCOSE, BUN, CREATININE, CALCIUM in the last 72 hours.  Intake/Output Summary (Last 24 hours) at 09/08/2019 0646 Last data filed at 09/07/2019 1727 Gross per 24 hour  Intake 350 ml  Output 400 ml  Net -50 ml     Physical Exam: Vital Signs Blood pressure (!) 132/41, pulse (!) 58, temperature 97.6 F (36.4 C), resp. rate 18, height 5\' 5"  (1.651 m), weight 63.6 kg, SpO2 95 %. Gen.: No acute distress Mood and affect are appropriate Heart regular rate and rhythm no rubs murmurs or extra sounds Lungs clear to auscultation, breathing unlabored Abdomen positive bowel sounds no tenderness palpation Extremities no clubbing cyanosis or edema   Neurologic: Tone mild hyperreflexia right lower extremity no clonus at the ankles bilaterally no evidence of flexor withdrawal Motor strength 4+ bilateral deltoid bicep 4 - bilateral tricep trace finger flexors bilaterally trace finger extensors bilaterally 0 hand intrinsics bilaterally Right lower extremity to minus hip extension 0 quadriceps 0 ankle dorsiflexion plantarflexion. Can wiggle toes.  Left lower extremity 5/5 in the hip flexor knee extensor ankle dorsiflexor and plantar flexor unchanged Musculoskeletal: No pain with active assisted as well as passive range of motion in the upper lower limb no joint swelling Sensation intact throughout  Assessment/Plan: 1. Functional deficits secondary to tetraparesis secondary to cervical myelopathy which require 3+ hours per day of interdisciplinary therapy in a comprehensive inpatient rehab setting.  Physiatrist is providing close team supervision and 24 hour management of active  medical problems listed below.  Physiatrist and rehab team continue to assess barriers to discharge/monitor patient progress toward functional and medical goals  Care Tool:  Bathing    Body parts bathed by patient: Right arm, Left arm, Chest, Abdomen, Front perineal area, Right upper leg, Left upper leg, Face   Body parts bathed by helper: Buttocks, Right lower leg, Left lower leg     Bathing assist Assist Level: Moderate Assistance - Patient 50 - 74%     Upper Body Dressing/Undressing Upper body dressing   What is the patient wearing?: Pull over shirt    Upper body assist Assist Level: Minimal Assistance - Patient > 75%    Lower Body Dressing/Undressing Lower body dressing      What is the patient wearing?: Incontinence brief, Pants     Lower body assist Assist for lower body dressing: Maximal Assistance - Patient 25 - 49%     Toileting Toileting    Toileting assist Assist for toileting: Maximal Assistance - Patient 25 - 49%     Transfers Chair/bed transfer  Transfers assist     Chair/bed transfer assist level: Maximal Assistance - Patient 25 - 49% (max A of 1 (+2 only present for safety))     Locomotion Ambulation   Ambulation assist   Ambulation activity did not occur: Safety/medical concerns          Walk 10 feet activity   Assist  Walk 10 feet activity did not occur: Safety/medical concerns        Walk 50 feet activity   Assist Walk 50 feet with 2 turns activity did not occur: Safety/medical concerns  Walk 150 feet activity   Assist Walk 150 feet activity did not occur: Safety/medical concerns         Walk 10 feet on uneven surface  activity   Assist Walk 10 feet on uneven surfaces activity did not occur: Safety/medical concerns         Wheelchair     Assist Will patient use wheelchair at discharge?: Yes Type of Wheelchair:  (TBD pending progress)    Wheelchair assist level: Dependent - Patient  0% Max wheelchair distance: 150'    Wheelchair 50 feet with 2 turns activity    Assist        Assist Level: Dependent - Patient 0%   Wheelchair 150 feet activity     Assist      Assist Level: Dependent - Patient 0%   Blood pressure (!) 132/41, pulse (!) 58, temperature 97.6 F (36.4 C), resp. rate 18, height 5\' 5"  (1.651 m), weight 63.6 kg, SpO2 95 %.  Medical Problem List and Plan: 1.   Tetraparesis secondary to cervical myelopathy -patient may  shower -ELOS/Goals: 20 to 24 days  -Continue CIR 2. Antithrombotics: -DVT/anticoagulation:Mechanical:Sequential compression devices, below kneeBilateral lower extremities. Bilateral lower extremity DVTs, age indeterminate, on . Started on Eliquis.  -antiplatelet therapy: N/A 3. Pain Management:Well controlled with prn Tylenol 4. Mood:LCSW to follow for evaluation and support. -antipsychotic agents: N/A 5. Neuropsych: This patient is capable of making decisions on her own behalf. 6. Skin/Wound Care:Monitor wound for healing. Will add protein supplement and multivitamin to promote healing. 7. Fluids/Electrolytes/Nutrition:Monitor I/O. Check lytes in am.  8. HTN: Monitor BP tid--continue HCTZ daily.Check BMET/K+ level in am. 9. Post op labs: Pre-op K+ low normal.  10 Neurogenic bladder: Has been incontinent with external catheter--will monitor voiding with PVRs/bladder scan. UA negative. Continue Toviaz 4mg  daily 11.Neurogenic bowel: KUB shows large stool burden and no obstruction. Had BM with enema on admission. On daily Miralax, colace TID, Senna HS. Mag citrate x1 on 8/6 12. Insomnia: Trazodone 100mg  did not provide benefit Try Amitriptyline 25mg .  13. Leukocytosis: WBC decreased to 12.4. UA/UC negative.     LOS: 5 days A FACE TO FACE EVALUATION WAS PERFORMED  US 09/08/2019, 6:46 AM

## 2019-09-09 ENCOUNTER — Inpatient Hospital Stay (HOSPITAL_COMMUNITY): Payer: Medicare PPO | Admitting: Occupational Therapy

## 2019-09-09 ENCOUNTER — Inpatient Hospital Stay (HOSPITAL_COMMUNITY): Payer: Medicare PPO | Admitting: Physical Therapy

## 2019-09-09 ENCOUNTER — Inpatient Hospital Stay (HOSPITAL_COMMUNITY): Payer: Medicare PPO

## 2019-09-09 MED ORDER — BISACODYL 10 MG RE SUPP
10.0000 mg | Freq: Every day | RECTAL | Status: DC
  Administered 2019-09-09 – 2019-10-02 (×22): 10 mg via RECTAL
  Filled 2019-09-09 (×24): qty 1

## 2019-09-09 NOTE — Progress Notes (Signed)
Occupational Therapy Session Note  Patient Details  Name: Haley Robinson MRN: [id number] Date of Birth: 09-09-1940  Today's Date: 09/09/2019 OT Individual Time: 1114-1140 OT Individual Time Calculation (min): 26 min    Short Term Goals: Week 1:  OT Short Term Goal 1 (Week 1): Patient will maintain unsupported static sitting balance with Min A in prep for BADLs. OT Short Term Goal 2 (Week 1): Patient will don UB clothing with Mod A and use of compensatory technique. OT Short Term Goal 3 (Week 1): Patient will demonstrate squat-pivot transfers with Max A. OT Short Term Goal 4 (Week 1): Patient will thread 1 LE through LB clothing in circle sitting with use of compensatory technique in prep for completion of LB BADLs.  Skilled Therapeutic Interventions/Progress Updates:    Pt received in TIS w/c with no c/o pain, agreeable to session. Pt was transported to the therapy gym total A. Pt completed slideboard transfer to the mat with total A. Remainder of session focused on core stabilization and sitting balance. Pt with frequent balance perturbations, requiring mod A overall statically to maintain balance EOM. Pt held a large ball between her BUE, cued for pec activation and engagement of core and completed functional lifts to 90 degrees shoulder FF. Pt required rest breaks between 2 x10 repetition trials. Pt returned to her chair, total A lateral scoot. Pt was returned to her room and left sitting up with all needs met, chair alarm set.   Therapy Documentation Precautions:  Precautions Precautions: Fall, Cervical Precaution Booklet Issued: Yes (comment) Precaution Comments: reviewed precautions Required Braces or Orthoses: Cervical Brace Cervical Brace: Soft collar (when OOB) Restrictions Weight Bearing Restrictions: No  Therapy/Group: Individual Therapy  Curtis Sites 09/09/2019, 6:37 AM

## 2019-09-09 NOTE — Progress Notes (Signed)
Occupational Therapy Session Note  Patient Details  Name: Haley Robinson MRN: [id number] Date of Birth: [birth year]  Today's Date: 09/09/2019 OT Individual Time: 1503-1530 OT Individual Time Calculation (min): 27 min    Short Term Goals: Week 1:  OT Short Term Goal 1 (Week 1): Patient will maintain unsupported static sitting balance with Min A in prep for BADLs. OT Short Term Goal 2 (Week 1): Patient will don UB clothing with Mod A and use of compensatory technique. OT Short Term Goal 3 (Week 1): Patient will demonstrate squat-pivot transfers with Max A. OT Short Term Goal 4 (Week 1): Patient will thread 1 LE through LB clothing in circle sitting with use of compensatory technique in prep for completion of LB BADLs.  Skilled Therapeutic Interventions/Progress Updates:    Pt greeted in bed with no c/o pain. Frustrated regarding being unable to void bladder when on bedpan, and then having urinary incontinence when in brief. NT had just changed her post incontinent event. Discussed using chair position in bed when on bedpan to increase ease of voiding, education provided regarding helpfulness of upright toileting position with both pt and her daughter. Also discussed this with RN to adjust bowel/bladder program. Pt was agreeable to bedlevel tx today. OT placed her in chair position and worked on B UE NMR via grasp release activity using paper towels. Pt transferring towels between bins, focused placed on repetitive practice. Upgraded task to involve anterior and lateral reaching to target, also involving grasp/release of paper towels as well as core activation. Pt had a few lateral LOBs and self corrected using the bedrail or beside table. Pt then reported she felt the urge to urinate. She was returned to supine and rolled with CGA for OT to remove brief and place bedpan. Set pt up in chair position to void. Pt wanted to remain on bedpan to try to void bowels as well. Notified RN of pts position,  recommending no longer than 10 minutes on bedpan for skin integrity. Pt remained on bedpan with daughter still present, bed alarm set, call bell within reach.   Therapy Documentation Precautions:  Precautions Precautions: Fall, Cervical Precaution Booklet Issued: Yes (comment) Precaution Comments: reviewed precautions Required Braces or Orthoses: Cervical Brace Cervical Brace: Soft collar (when OOB) Restrictions Weight Bearing Restrictions: No Vital Signs: Therapy Vitals Temp: 98.2 F (36.8 C) Temp Source: Oral Pulse Rate: (!) 52 Resp: 18 BP: (!) 120/51 Patient Position (if appropriate): Lying Oxygen Therapy SpO2: 95 % O2 Device: Room Air ADL: ADL Upper Body Bathing: Maximal assistance Where Assessed-Upper Body Bathing: Bed level Lower Body Bathing: Dependent Where Assessed-Lower Body Bathing: Bed level Upper Body Dressing: Maximal assistance Where Assessed-Upper Body Dressing: Bed level Lower Body Dressing: Dependent Where Assessed-Lower Body Dressing: Bed level Toileting: Dependent Where Assessed-Toileting: Bed level Toilet Transfer: Unable to assess Tub/Shower Transfer: Unable to assess ADL Comments: Max A grossly for UB BADLs and Total A grossly for LB BADLs at bed level in supported long/circle sitting.     Therapy/Group: Individual Therapy  Haley Robinson A Haley Robinson 09/09/2019, 3:57 PM

## 2019-09-09 NOTE — Progress Notes (Signed)
Physical Therapy Session Note  Patient Details  Name: Haley Robinson MRN: [id number] Date of Birth: 12-03-1940  Today's Date: 09/09/2019 PT Individual Time: 0902-1000 and 1310-1340 PT Individual Time Calculation (min): 58 min and 30 min.  Short Term Goals: Week 1:  PT Short Term Goal 1 (Week 1): Pt will perform rolling with min A consistently PT Short Term Goal 2 (Week 1): Pt will perform bed mobility with assist x 1 consistently PT Short Term Goal 3 (Week 1): Pt will maintain static sitting balance x 5 min with mod A  Skilled Therapeutic Interventions/Progress Updates:   First session: Pt presents supine in bed and agreeable to therapy.  Pt states pain in buttocks and lower right leg of 5/10.  Pt rolled side to side w/ min A and side rails to don pants.  Pt requires manual assist to flex R knee to roll to contralateral side.  Pt required max A to transfer sidelying to sit on EOB.  Pt unable to maintain sitting balance while PT donning shoes, although maintains short periods w/ UE support.  Pt returned to spine w/ log roll and shoes donned.  Pt transferred to sitting w/ log roll and nursing assisted to bring w/c closer and lock.  Pt transferred SPT w/ max A and 2nd person for standby.  Pt transferred sit to stand again to improve hip positioning in w/c.  Blocking of R knee during transfers.  Pt wheeled to gym for time conservation.  Pt performed seated reaching forward to tap number pads w/ guarding for balance w/ attention to limits of stability..  Pt reaching for cups and crossing midline for placement.  Pt performed BUE chest press w/ unweighted bar w/o trunk support to challenge balance.  Pt required seate rest breaks w/ TIS tilted back 2/2 fatigue.  Pt returned to room and TIS tilted back and chair alarm on w/ all needs in reach.  Second session:  Pt agreeable to treatment early.  Pt wheeled to gym for time conservation.  Pt performed multiple scoots forward and back into chair w/ mod A and  side leans to increase independence, especially left side.  Pt performed multiple static sits w/o UB support, verbal cues for ant pelvic tilt.  Pt performed trunk strengthening, pushing and pulling unweighted bar against PT resistance.  Pt returned to room and remained in TIS per wishes.  Seat belt and chair alarm in place, all needs in place w/ family member present.     Therapy Documentation Precautions:  Precautions Precautions: Fall, Cervical Precaution Booklet Issued: Yes (comment) Precaution Comments: reviewed precautions Required Braces or Orthoses: Cervical Brace Cervical Brace: Soft collar (when OOB) Restrictions Weight Bearing Restrictions: No General:   Vital Signs:  Pain: 5/10 to buttocks and RLE. Pain Assessment Pain Scale: 0-10 Pain Score: 0-No pain Mobility:   Locomotion :    Trunk/Postural Assessment :    Balance:   Exercises:   Other Treatments:      Therapy/Group: Individual Therapy  Lucio Edward 09/09/2019, 10:02 AM

## 2019-09-09 NOTE — Progress Notes (Signed)
Occupational Therapy Session Note  Patient Details  Name: Haley Robinson MRN: [id number] Date of Birth: 06/27/1940  Today's Date: 09/09/2019 OT Individual Time: 1017-1058 OT Individual Time Calculation (min): 41 min   Skilled Therapeutic Interventions/Progress Updates:    Pt greeted in the TIS with no c/o pain. Agreeable to session, requesting to start by changing her shirt. Mod A for doffing/donning overhead shirt, pt needing assistance to manage fabric over neck and also around trunk. Transitioned to NMR of grip strengthening with the Rt hand using sponge and also OT's hand, prolonged, focused squeezes 10 reps 2 sets. Worked on grasp strength bilaterally while guiding pt through UB exercises using unweighted dowel rod x20 reps each exercise. Pt required Mod A to grip bar with the Rt hand and also assistance to stabilize dowel rod in space or else pt would drop it in lap or on the floor. At end of session pt was reclined for comfort and pressure relief in TIS. Left her with all needs within reach and safety belt fastened.   Therapy Documentation Precautions:  Precautions Precautions: Fall, Cervical Precaution Booklet Issued: Yes (comment) Precaution Comments: reviewed precautions Required Braces or Orthoses: Cervical Brace Cervical Brace: Soft collar (when OOB) Restrictions Weight Bearing Restrictions: No ADL: ADL Upper Body Bathing: Maximal assistance Where Assessed-Upper Body Bathing: Bed level Lower Body Bathing: Dependent Where Assessed-Lower Body Bathing: Bed level Upper Body Dressing: Maximal assistance Where Assessed-Upper Body Dressing: Bed level Lower Body Dressing: Dependent Where Assessed-Lower Body Dressing: Bed level Toileting: Dependent Where Assessed-Toileting: Bed level Toilet Transfer: Unable to assess Tub/Shower Transfer: Unable to assess ADL Comments: Max A grossly for UB BADLs and Total A grossly for LB BADLs at bed level in supported long/circle sitting.      Therapy/Group: Individual Therapy  Tannisha Kennington A Evelynn Hench 09/09/2019, 12:22 PM

## 2019-09-09 NOTE — Progress Notes (Signed)
[facility] PHYSICAL MEDICINE & REHABILITATION PROGRESS NOTE   Subjective/Complaints: No issues overnite, states she did not have therapy yesterday .  ROS: Denies pain. +constipation, insomnia.  Objective:   No results found. Recent Labs    09/07/19 1110  WBC 12.4*  HGB 12.7  HCT 38.4  PLT 254   No results for input(s): NA, K, CL, CO2, GLUCOSE, BUN, CREATININE, CALCIUM in the last 72 hours.  Intake/Output Summary (Last 24 hours) at 09/09/2019 0657 Last data filed at 09/08/2019 1837 Gross per 24 hour  Intake 440 ml  Output 500 ml  Net -60 ml     Physical Exam: Vital Signs Blood pressure (!) 130/49, pulse (!) 52, temperature 98.1 F (36.7 C), resp. rate 17, height 5\' 5"  (1.651 m), weight 63.6 kg, SpO2 98 %. Gen.: No acute distress Mood and affect are appropriate Heart regular rate and rhythm no rubs murmurs or extra sounds Lungs clear to auscultation, breathing unlabored Abdomen positive bowel sounds no tenderness palpation Extremities no clubbing cyanosis or edema   Neurologic: Tone mild hyperreflexia right lower extremity no clonus at the ankles bilaterally no evidence of flexor withdrawal Motor strength 4+ bilateral deltoid bicep 4 - bilateral tricep trace finger flexors bilaterally trace finger extensors bilaterally 0 hand intrinsics bilaterally Right lower extremity to minus hip extension 0 quadriceps 0 ankle dorsiflexion plantarflexion. Can wiggle toes.  Left lower extremity 5/5 in the hip flexor knee extensor ankle dorsiflexor and plantar flexor unchanged Musculoskeletal: No pain with active assisted as well as passive range of motion in the upper lower limb no joint swelling Sensation intact throughout  Assessment/Plan: 1. Functional deficits secondary to tetraparesis secondary to cervical myelopathy which require 3+ hours per day of interdisciplinary therapy in a comprehensive inpatient rehab setting.  Physiatrist is providing close team supervision and 24 hour  management of active medical problems listed below.  Physiatrist and rehab team continue to assess barriers to discharge/monitor patient progress toward functional and medical goals  Care Tool:  Bathing    Body parts bathed by patient: Right arm, Left arm, Chest, Abdomen, Front perineal area, Right upper leg, Left upper leg, Face   Body parts bathed by helper: Buttocks, Right lower leg, Left lower leg     Bathing assist Assist Level: Moderate Assistance - Patient 50 - 74%     Upper Body Dressing/Undressing Upper body dressing   What is the patient wearing?: Pull over shirt    Upper body assist Assist Level: Minimal Assistance - Patient > 75%    Lower Body Dressing/Undressing Lower body dressing      What is the patient wearing?: Incontinence brief, Pants     Lower body assist Assist for lower body dressing: Maximal Assistance - Patient 25 - 49%     Toileting Toileting    Toileting assist Assist for toileting: Maximal Assistance - Patient 25 - 49%     Transfers Chair/bed transfer  Transfers assist     Chair/bed transfer assist level: Maximal Assistance - Patient 25 - 49% (max A of 1 (+2 only present for safety))     Locomotion Ambulation   Ambulation assist   Ambulation activity did not occur: Safety/medical concerns          Walk 10 feet activity   Assist  Walk 10 feet activity did not occur: Safety/medical concerns        Walk 50 feet activity   Assist Walk 50 feet with 2 turns activity did not occur: Safety/medical concerns  Walk 150 feet activity   Assist Walk 150 feet activity did not occur: Safety/medical concerns         Walk 10 feet on uneven surface  activity   Assist Walk 10 feet on uneven surfaces activity did not occur: Safety/medical concerns         Wheelchair     Assist Will patient use wheelchair at discharge?: Yes Type of Wheelchair:  (TBD pending progress)    Wheelchair assist level:  Dependent - Patient 0% Max wheelchair distance: 150'    Wheelchair 50 feet with 2 turns activity    Assist        Assist Level: Dependent - Patient 0%   Wheelchair 150 feet activity     Assist      Assist Level: Dependent - Patient 0%   Blood pressure (!) 130/49, pulse (!) 52, temperature 98.1 F (36.7 C), resp. rate 17, height 5\' 5"  (1.651 m), weight 63.6 kg, SpO2 98 %.  Medical Problem List and Plan: 1.   Tetraparesis secondary to cervical myelopathy -patient may  shower -ELOS/Goals: 20 to 24 days  -Continue CIR 2. Antithrombotics: -DVT/anticoagulation:Mechanical:Sequential compression devices, below kneeBilateral lower extremities. Bilateral lower extremity DVTs, age indeterminate, on . Started on Eliquis.  -antiplatelet therapy: N/A 3. Pain Management:Well controlled with prn Tylenol 4. Mood:LCSW to follow for evaluation and support. -antipsychotic agents: N/A 5. Neuropsych: This patient is capable of making decisions on her own behalf. 6. Skin/Wound Care:Monitor wound for healing. Will add protein supplement and multivitamin to promote healing. 7. Fluids/Electrolytes/Nutrition:Monitor I/O. Check lytes in am.  8. HTN: Monitor BP tid--continue HCTZ daily.Check BMET/K+ level in am. 9. Post op labs: Pre-op K+ low normal.  10 Neurogenic bladder: Has been incontinent with external catheter--will monitor voiding with PVRs/bladder scan. UA negative. Continue Toviaz 4mg  daily 11.Neurogenic bowel: KUB shows large stool burden and no obstruction. Had BM with enema on admission. On daily Miralax, colace TID, Senna HS. Mag citrate x1 on 8/6, bisacodyl 10mg  supp qpm 12. Insomnia: Trazodone 100mg  did not provide benefit Try Amitriptyline 25mg .  13. Leukocytosis: WBC decreased to 12.4. UA/UC negative.     LOS: 6 days A FACE TO FACE EVALUATION WAS PERFORMED  US 09/09/2019, 6:57 AM

## 2019-09-10 ENCOUNTER — Inpatient Hospital Stay (HOSPITAL_COMMUNITY): Payer: Medicare PPO

## 2019-09-10 ENCOUNTER — Inpatient Hospital Stay (HOSPITAL_COMMUNITY): Payer: Medicare PPO | Admitting: Physical Therapy

## 2019-09-10 DIAGNOSIS — L8990 Pressure ulcer of unspecified site, unspecified stage: Secondary | ICD-10-CM | POA: Insufficient documentation

## 2019-09-10 LAB — GLUCOSE, CAPILLARY: Glucose-Capillary: 130 mg/dL — ABNORMAL HIGH (ref 70–99)

## 2019-09-10 LAB — CBC
HCT: 36.2 % (ref 36.0–46.0)
Hemoglobin: 11.7 g/dL — ABNORMAL LOW (ref 12.0–15.0)
MCH: 31 pg (ref 26.0–34.0)
MCHC: 32.3 g/dL (ref 30.0–36.0)
MCV: 96 fL (ref 80.0–100.0)
Platelets: 276 10*3/uL (ref 150–400)
RBC: 3.77 MIL/uL — ABNORMAL LOW (ref 3.87–5.11)
RDW: 13.3 % (ref 11.5–15.5)
WBC: 12.8 10*3/uL — ABNORMAL HIGH (ref 4.0–10.5)
nRBC: 0 % (ref 0.0–0.2)

## 2019-09-10 LAB — BASIC METABOLIC PANEL
Anion gap: 9 (ref 5–15)
BUN: 22 mg/dL (ref 8–23)
CO2: 30 mmol/L (ref 22–32)
Calcium: 8.9 mg/dL (ref 8.9–10.3)
Chloride: 102 mmol/L (ref 98–111)
Creatinine, Ser: 0.82 mg/dL (ref 0.44–1.00)
GFR calc Af Amer: >60 mL/min (ref >60)
GFR calc non Af Amer: >60 mL/min (ref >60)
Glucose, Bld: 114 mg/dL — ABNORMAL HIGH (ref 70–99)
Potassium: 3.6 mmol/L (ref 3.5–5.1)
Sodium: 141 mmol/L (ref 135–145)

## 2019-09-10 NOTE — Progress Notes (Signed)
[place] PHYSICAL MEDICINE & REHABILITATION PROGRESS NOTE   Subjective/Complaints: Has no control over urine or bowel- LBM yesterday- says she's On the bowel program- per orders- had 2 large BMs last night after suppository.   Says doesn't really hurt anywhere else.  Just hurts on backside where has wound.   WBC 12.8- slightly higher.   ROS:  Pt denies SOB, abd pain, CP, N/V/C/D, and vision changes   Objective:   No results found. Recent Labs    09/07/19 1110 09/10/19 0519  WBC 12.4* 12.8*  HGB 12.7 11.7*  HCT 38.4 36.2  PLT 254 276   Recent Labs    09/10/19 0519  NA 141  K 3.6  CL 102  CO2 30  GLUCOSE 114*  BUN 22  CREATININE 0.82  CALCIUM 8.9    Intake/Output Summary (Last 24 hours) at 09/10/2019 0846 Last data filed at 09/10/2019 0836 Gross per 24 hour  Intake 680 ml  Output --  Net 680 ml     Physical Exam: Vital Signs Blood pressure 116/64, pulse (!) 55, temperature 98.3 F (36.8 C), resp. rate 18, height 5\' 5"  (1.651 m), weight 63.6 kg, SpO2 95 %. Gen.: No acute distress- sitting up in bed- finishing breakfast, NAD Mood and affect are appropriate Heart RRR Lungs CTA B/L- no W/R/R- good air movement Abdomen Soft, NT, ND, (+)BS  Extremities no clubbing cyanosis or edema   Neurologic: Tone mild hyperreflexia right lower extremity no clonus at the ankles bilaterally no evidence of flexor withdrawal Motor strength 4+ bilateral deltoid bicep 4 - bilateral tricep trace finger flexors bilaterally trace finger extensors bilaterally 0 hand intrinsics bilaterally Right lower extremity to minus hip extension 0 quadriceps 0 ankle dorsiflexion plantarflexion. Can wiggle toes.  Left lower extremity 5/5 in the hip flexor knee extensor ankle dorsiflexor and plantar flexor unchanged Musculoskeletal: No pain with active assisted as well as passive range of motion in the upper lower limb no joint swelling Sensation intact throughout  Assessment/Plan: 1.  Functional deficits secondary to tetraparesis secondary to cervical myelopathy which require 3+ hours per day of interdisciplinary therapy in a comprehensive inpatient rehab setting.  Physiatrist is providing close team supervision and 24 hour management of active medical problems listed below.  Physiatrist and rehab team continue to assess barriers to discharge/monitor patient progress toward functional and medical goals  Care Tool:  Bathing    Body parts bathed by patient: Right arm, Left arm, Chest, Abdomen, Front perineal area, Right upper leg, Left upper leg, Face   Body parts bathed by helper: Buttocks, Right lower leg, Left lower leg     Bathing assist Assist Level: Moderate Assistance - Patient 50 - 74%     Upper Body Dressing/Undressing Upper body dressing   What is the patient wearing?: Pull over shirt    Upper body assist Assist Level: Minimal Assistance - Patient > 75%    Lower Body Dressing/Undressing Lower body dressing      What is the patient wearing?: Incontinence brief, Pants     Lower body assist Assist for lower body dressing: Maximal Assistance - Patient 25 - 49%     Toileting Toileting    Toileting assist Assist for toileting: Maximal Assistance - Patient 25 - 49%     Transfers Chair/bed transfer  Transfers assist     Chair/bed transfer assist level: Maximal Assistance - Patient 25 - 49%     Locomotion Ambulation   Ambulation assist   Ambulation activity did not occur: Safety/medical  concerns          Walk 10 feet activity   Assist  Walk 10 feet activity did not occur: Safety/medical concerns        Walk 50 feet activity   Assist Walk 50 feet with 2 turns activity did not occur: Safety/medical concerns         Walk 150 feet activity   Assist Walk 150 feet activity did not occur: Safety/medical concerns         Walk 10 feet on uneven surface  activity   Assist Walk 10 feet on uneven surfaces activity did  not occur: Safety/medical concerns         Wheelchair     Assist Will patient use wheelchair at discharge?: Yes Type of Wheelchair:  (TBD pending progress)    Wheelchair assist level: Dependent - Patient 0% Max wheelchair distance: 150'    Wheelchair 50 feet with 2 turns activity    Assist        Assist Level: Dependent - Patient 0%   Wheelchair 150 feet activity     Assist      Assist Level: Dependent - Patient 0%   Blood pressure 116/64, pulse (!) 55, temperature 98.3 F (36.8 C), resp. rate 18, height 5\' 5"  (1.651 m), weight 63.6 kg, SpO2 95 %.  Medical Problem List and Plan: 1.   Tetraparesis secondary to cervical myelopathy -patient may  shower -ELOS/Goals: 20 to 24 days  -Continue CIR 2. Antithrombotics: -DVT/anticoagulation:Mechanical:Sequential compression devices, below kneeBilateral lower extremities. Bilateral lower extremity DVTs, age indeterminate, on . Started on Eliquis.   8/9- needs at least 3 months -antiplatelet therapy: N/A 3. Pain Management:Well controlled with prn Tylenol 4. Mood:LCSW to follow for evaluation and support. -antipsychotic agents: N/A 5. Neuropsych: This patient is capable of making decisions on her own behalf. 6. Skin/Wound Care:Monitor wound for healing. Will add protein supplement and multivitamin to promote healing. 7. Fluids/Electrolytes/Nutrition:Monitor I/O. Check lytes in am.  8. HTN: Monitor BP tid--continue HCTZ daily.Check BMET/K+ level in am. 9. Post op labs: Pre-op K+ low normal.   8/9- K+ 3.6 still 10 Neurogenic bladder: Has been incontinent with external catheter--will monitor voiding with PVRs/bladder scan. UA negative. Continue Toviaz 4mg  daily  8/9- U/A was (-) 8/3- same time also had a elevated WBC. Will monitor/WBC 11.Neurogenic bowel: KUB shows large stool burden and no obstruction. Had BM with enema on admission. On daily Miralax,  colace TID, Senna HS. Mag citrate x1 on 8/6, bisacodyl 10mg  supp qpm  8/9- going regularly- 12. Insomnia: Trazodone 100mg  did not provide benefit Try Amitriptyline 25mg .  13. Leukocytosis: WBC decreased to 12.4. UA/UC negative.   8/9- WBC 12.8- basically stable- con't to monitor 2x/week.  14. Bradycardia  8/9- only BP med HCTZ- so is baseline for pt- will monitor       LOS: 7 days A FACE TO FACE EVALUATION WAS PERFORMED  Haley Robinson 09/10/2019, 8:46 AM

## 2019-09-10 NOTE — Progress Notes (Signed)
Patient had  large incontinent BMx2 post dulcolax given by day shift rn.

## 2019-09-10 NOTE — Progress Notes (Signed)
Physical Therapy Session Note  Patient Details  Name: Haley Robinson MRN: [id number] Date of Birth: 1940-04-14  Today's Date: 09/10/2019 PT Individual Time: 1100-1155; 1400-1500 PT Individual Time Calculation (min): 55 min and 60 min  Short Term Goals: Week 1:  PT Short Term Goal 1 (Week 1): Pt will perform rolling with min A consistently PT Short Term Goal 2 (Week 1): Pt will perform bed mobility with assist x 1 consistently PT Short Term Goal 3 (Week 1): Pt will maintain static sitting balance x 5 min with mod A  Skilled Therapeutic Interventions/Progress Updates:    Session 1: Pt received seated in TIS w/c in room, agreeable to PT session. No complaints of pain. Pt reports feeling very fatigued this AM due to not sleeping well. Pt reports discomfort at sacrum throughout the night keeping her awake, reports she was able to be repositioned on her side during the night but continued to experience discomfort. Dependent transport via TIS w/c to/from therapy gym. Attempt to have pt perform BLE strengthening on Kinetron, unable to position correctly due to width of wheels on TIS w/c in order to position LE on pedals. Seated BUE shoulder and elbow AAROM on towel at chest-height table performing elbow extension with shoulder flexion and then elbow flexion with shoulder extension, increased assist needed for RUE as compared to LUE x 15 reps B. "Windshield-wipers" x 15 reps B with increased assist needed for RUE as compared to LUE for horizontal adduction and abduction. Slide board transfer to/from mat table with assist x 2. Sit to supine max A for LE management and trunk control. Rolling L/R with min A for RLE management with focus on use of shoulder to initiate roll and gain momentum during transfer. Returned to sitting EOB with max A for RLE management and trunk control. Pt able to maintain sitting balance EOM with close SBA for several seconds with no UE support before losing balance posteriorly. Seated  alt UE reaching outside BOS and across midline with mod to max A for trunk control. Slide board transfer back to TIS with assist x 2. NT requesting pt return to bed at end of session for bladder scan. Slide board transfer back to bed with assist x 2. Sit to supine max A for BLE management and trunk control. Pt left supine in bed in care of NT.  Session 2: Pt received seated in bed, agreeable to PT session. No complaints of pain. Updated patient and her daughter Haley Robinson on conference schedule and that team will update them following conference with regards to d/c date as well as setting up family education close to that d/c date. Assisted pt with donning her shoes while in bed. Seated in bed to sitting EOB with max A for LE management and trunk elevation. Slide board transfer to manual w/c to assess w/c mobility. Pt requires use of a gait belt around her trunk/midsection to prevent anterior LOB out of manual w/c. Attempt to have pt perform w/c mobility with use of BUE, pt struggles to grip w/c rim. Provided w/c gloves and added theraband for improved grip on w/c rims. Pt is able to propel x 50 ft, x 100 ft with use of BUE at min A level for some steering with improved grip noted with use of theraband and w/c gloves. Pt struggles with turns due to ongoing UE weakness. Pt also exhibits ongoing decreased trunk control and frequently flexes trunk while performing w/c mobility due to inability to maintain upright sitting, min A  to correct. Pt fatigues quickly with w/c mobility. Slide board transfer manual w/c to bed then to TIS w/c with assist x 2. Pt left semi-reclined in TIS w/c in room with needs in reach, quick release belt and chair alarm in place at end of session, daughter present.  Therapy Documentation Precautions:  Precautions Precautions: Fall, Cervical Precaution Booklet Issued: Yes (comment) Precaution Comments: reviewed precautions Required Braces or Orthoses: Cervical Brace Cervical Brace: Soft  collar (when OOB) Restrictions Weight Bearing Restrictions: No    Therapy/Group: Individual Therapy   Peter Congo, PT, DPT  09/10/2019, 12:06 PM

## 2019-09-10 NOTE — Progress Notes (Signed)
Physical Therapy Weekly Progress Note  Patient Details  Name: Haley Robinson MRN: [id number] Date of Birth: 1941/01/27  Beginning of progress report period: September 04, 2019 End of progress report period: September 11, 2019  Today's Date: 09/10/2019  Patient has met 2 of 3 short term goals.  Pt is making slow progress towards therapy goals. Pt is currently at min A for rolling, max A to +2 for supine to/from sit, requires +2 assist for squat pivot or slide board transfers, and has been able to initiate w/c mobility with use of a manual wheelchair. Pt was able to propel manual w/c x 30 ft with min A with use of BUE and theraband on w/c rims for improved grip. Pt continues to exhibit poor trunk control and exhibits poor unsupported sitting balance. Pt has been able to perform some standing with use of the standing frame but unable to maintain upright posture due to poor trunk control as well as little to no muscle activation in RLE.  Patient continues to demonstrate the following deficits muscle weakness, muscle joint tightness and muscle paralysis, decreased cardiorespiratoy endurance, abnormal tone, unbalanced muscle activation and decreased coordination and decreased sitting balance, decreased standing balance, decreased postural control and decreased balance strategies and therefore will continue to benefit from skilled PT intervention to increase functional independence with mobility.  Patient progressing toward long term goals..  Continue plan of care.  PT Short Term Goals Week 1:  PT Short Term Goal 1 (Week 1): Pt will perform rolling with min A consistently PT Short Term Goal 1 - Progress (Week 1): Met PT Short Term Goal 2 (Week 1): Pt will perform bed mobility with assist x 1 consistently PT Short Term Goal 2 - Progress (Week 1): Progressing toward goal PT Short Term Goal 3 (Week 1): Pt will maintain static sitting balance x 5 min with mod A PT Short Term Goal 3 - Progress (Week 1): Met Week  2:  PT Short Term Goal 1 (Week 2): Pt will perform least restricive transfer with assist x 1 consistently PT Short Term Goal 2 (Week 2): Pt will perform w/c mobility x 50 ft with LRAD PT Short Term Goal 3 (Week 2): Pt will perform bed mobility with assist x 1 consistently   Therapy Documentation Precautions:  Precautions Precautions: Fall, Cervical Precaution Booklet Issued: Yes (comment) Precaution Comments: reviewed precautions Required Braces or Orthoses: Cervical Brace Cervical Brace: Soft collar (when OOB) Restrictions Weight Bearing Restrictions: No   Therapy/Group: Individual Therapy   Excell Seltzer, PT, DPT  09/10/2019, 7:48 AM

## 2019-09-10 NOTE — Progress Notes (Signed)
Occupational Therapy Session Note  Patient Details  Name: Haley Robinson MRN: [id number] Date of Birth: 1940-09-06  Today's Date: 09/10/2019 OT Individual Time: 6433-2951 OT Individual Time Calculation (min): 70 min    Short Term Goals: Week 2:  OT Short Term Goal 1 (Week 2): Patient will thread 1 LE through LB clothing in circle sitting with use of compensatory technique in prep for completion of LB BADLs. OT Short Term Goal 2 (Week 2): Patient will demonstrate squat-pivot transfers with Max A. OT Short Term Goal 3 (Week 2): Pt will don pull over shirt with min A OT Short Term Goal 4 (Week 2): Pt perform toileting tasks with max A  Skilled Therapeutic Interventions/Progress Updates:    Pt resting in bed upon arrival and agreeable to therapy.  OT intervention with focus on bed mobility, sitting balance, SB transfers, BADL retraining, and activity tolerance to increase independence with BADLs. Pt incontinent of bladder and required tot A for toileting at bed level.  LB bathing at bed level.  LB dressing at bed level with tot A. Rolling R/L using bed rails with mod A. Pt able to initiate L knee flexion with verbal cues in preparation for rolling to R. Pt dependent for R knee flexion in preparation for rolling to L. Supine>sit EOB with mod A. Pt initiating pushing up through RUE.  Sitting balance with min A in preparation for SB transfer to w/c   SB transfer with tot A+2 to w/c.  Pt completed UB bathing seated in w/c with setup and UB dressing with min A. Pt brushed teeth after setup.  Pt remained in w/c with all needs within reach and belt alarm activated.   Therapy Documentation Precautions:  Precautions Precautions: Fall, Cervical Precaution Booklet Issued: Yes (comment) Precaution Comments: reviewed precautions Required Braces or Orthoses: Cervical Brace Cervical Brace: Soft collar (when OOB) Restrictions Weight Bearing Restrictions: No  Pain: Pt commented that her "bottom" was hurting  earlier in the morning but is feeling better after meds at 5 am  Therapy/Group: Individual Therapy  Rich Brave 09/10/2019, 10:03 AM

## 2019-09-10 NOTE — Progress Notes (Signed)
Occupational Therapy Weekly Progress Note  Patient Details  Name: Haley Robinson MRN: [id number] Date of Birth: 01/09/1941  Beginning of progress report period: September 04, 2019 End of progress report period: September 10, 2019  Patient has met 2 of 4 short term goals.  Pt is making slow but steady progress with BADLs at bed level and sitting in w/c.  Pt requires mod/max a for bed mobility and supine>sit EOB. Pt can maintain static sitting balance on EOB or EOM with min A. Pt requires max A for dynamic sitting balance.  Bathing at bed level and seated in w/c with mod A. UB dressing with mod A seated in w/c. LB dressing at bed level with max/tot A. Squat pivot transfers with max A +2.  Pt requires setup assist for self feeding and grooming tasks.   Patient continues to demonstrate the following deficits: muscle weakness and muscle paralysis, decreased cardiorespiratoy endurance, unbalanced muscle activation and decreased coordination and decreased sitting balance, decreased postural control and decreased balance strategies and therefore will continue to benefit from skilled OT intervention to enhance overall performance with BADL.  Patient progressing toward long term goals..  Continue plan of care.  OT Short Term Goals Week 1:  OT Short Term Goal 1 (Week 1): Patient will maintain unsupported static sitting balance with Min A in prep for BADLs. OT Short Term Goal 1 - Progress (Week 1): Met OT Short Term Goal 2 (Week 1): Patient will don UB clothing with Mod A and use of compensatory technique. OT Short Term Goal 2 - Progress (Week 1): Met OT Short Term Goal 3 (Week 1): Patient will demonstrate squat-pivot transfers with Max A. OT Short Term Goal 3 - Progress (Week 1): Progressing toward goal OT Short Term Goal 4 (Week 1): Patient will thread 1 LE through LB clothing in circle sitting with use of compensatory technique in prep for completion of LB BADLs. OT Short Term Goal 4 - Progress (Week 1):  Progressing toward goal Week 2:  OT Short Term Goal 1 (Week 2): Patient will thread 1 LE through LB clothing in circle sitting with use of compensatory technique in prep for completion of LB BADLs. OT Short Term Goal 2 (Week 2): Patient will demonstrate squat-pivot transfers with Max A. OT Short Term Goal 3 (Week 2): Pt will don pull over shirt with min A OT Short Term Goal 4 (Week 2): Pt perform toileting tasks with max A   Leroy Libman 09/10/2019, 6:23 AM

## 2019-09-11 ENCOUNTER — Inpatient Hospital Stay (HOSPITAL_COMMUNITY): Payer: Medicare PPO | Admitting: *Deleted

## 2019-09-11 ENCOUNTER — Inpatient Hospital Stay (HOSPITAL_COMMUNITY): Payer: Medicare PPO | Admitting: Physical Therapy

## 2019-09-11 ENCOUNTER — Inpatient Hospital Stay (HOSPITAL_COMMUNITY): Payer: Medicare PPO

## 2019-09-11 NOTE — Patient Care Conference (Signed)
Inpatient RehabilitationTeam Conference and Plan of Care Update Date: 09/11/2019   Time: 11:11 AM    Patient Name: ALEKHYA GRAVLIN      Medical Record Number: [id number]  Date of Birth: 20-Nov-1940 Sex: Female         Room/Bed: 4M03C/4M03C-01 Payor Info: Payor: [employer] MEDICARE / Plan: [employer] MEDICARE CHOICE PPO / Product Type: *No Product type* /    Admit Date/Time:  09/03/2019  5:32 PM  Primary Diagnosis:  Cervical myelopathy [facility]  Hospital Problems: Principal Problem:   Cervical myelopathy (HCC) Active Problems:   Cervical arthritis with myelopathy   Pressure injury of skin    Expected Discharge Date: Expected Discharge Date: 10/03/19  Team Members Present: Physician leading conference: Dr. Genice Rouge Care Coodinator Present: Cecile Sheerer, LCSWA;Yamen Castrogiovanni Marlyne Beards, RN, BSN, CRRN Nurse Present: Otilio Carpen, RN PT Present: Peter Congo, PT OT Present: Ardis Rowan, Elvera Maria, OT PPS Coordinator present : Edson Snowball, Park Breed, SLP     Current Status/Progress Goal Weekly Team Focus  Bowel/Bladder   Pt is incontinent of bowel and bladder but able to feel when she is actively voiding/defecating. Currently on bowel program and bladder scanned Q6 with straight cath as needed.  Regain continence of bowel and bladder.  Assess toileting needs Q2. Bladder scan Q6.   Swallow/Nutrition/ Hydration             ADL's   bathing at bed level and seated in w/c-mod a; LB dressing at bed level-max A; UB dressing-min A; functional transfers-max A+2 squat pivot or SB; static sitting balance-min A; dynamic sitting balance-max A  min A overall  sitting balance, functional transfers, BADL retraining, core strengthening, educaiton   Mobility   min A rolling, max A supine to/from sit, max A to +2 transfers via squat pivot and SB, min A w/c mobility x 100 ft  min A overall at w/c level  sitting balance, transfers, initiate w/c mobility   Communication              Safety/Cognition/ Behavioral Observations            Pain   No current complaints of pain  Remain pain free  Assess q shift and PRN   Skin   St 2 to sacrum. Foam and barrier cream applied.  Prevent further breakdown, maintain skin integrity, and promote wound healing  Assess skin q shift and PRN     Discharge Planning:   Pt daughter [name] and husband live with pt in the home. Both daughters, [name] and [name] will alternate their time with assisting with care.  Team Discussion: Incontinent of B/B, pt on nightly bowel program, need nightly documentation of success or not. Time toilet pt q 2hr. New order to turn pt q 2 hr. Remind pt and daughters to call for pressure relief. Bed mobility has improved. Lower body BD is a bed level with limited progress. Slideboard transfers are with +2 assist. Pt is slow to progress. Stage 2 to sacrum with foam dressing. DTI to right upper chest under neck collar, MD will assess. Patient on target to meet rehab goals: yes, with core strengthening, sitting balance, and transfers as a focus.  *See Care Plan and progress notes for long and short-term goals.   Revisions to Treatment Plan:  none  Teaching Needs: Continue family education as recommended by therapy, teach family how to turn and boost patient for pressure relief.  Current Barriers to Discharge: Neurogenic bowel and bladder, Wound care and Weight bearing  restrictions  Possible Resolutions to Barriers: Continue family education on bowel program, teach family I&O cathing, teach family wound care of the Stage 2 to the sacrum, and teach family to turn and boost patient for pressure relief.     Medical Summary Current Status: neurogenic B/B- on bowel program- already went when calls- PVRs OK- shoulder R>L pain and sacral pain- from ulcer- Stage II- doing pressure relief  Barriers to Discharge: Decreased family/caregiver support;Incontinence;Home enviroment access/layout;Neurogenic Bowel &  Bladder;Wound care;Weight bearing restrictions  Barriers to Discharge Comments: DTI under soft collar- dark pink-R chest area- incomplete tetraplegia- Neurogenic bowel/bladder Possible Resolutions to Barriers/Weekly Focus: slow progress PT/OT- can't stand; w/c level likely; poor sitting balance- on bowel program- voiding- incontinent; bed level ADLs- sacral ulcer   Continued Need for Acute Rehabilitation Level of Care: The patient requires daily medical management by a physician with specialized training in physical medicine and rehabilitation for the following reasons: Direction of a multidisciplinary physical rehabilitation program to maximize functional independence : Yes Medical management of patient stability for increased activity during participation in an intensive rehabilitation regime.: Yes Analysis of laboratory values and/or radiology reports with any subsequent need for medication adjustment and/or medical intervention. : Yes   I attest that I was present, lead the team conference, and concur with the assessment and plan of the team.   Tennis Must 09/11/2019, 12:55 PM

## 2019-09-11 NOTE — Discharge Instructions (Addendum)
Inpatient Rehab Discharge Instructions  Haley Robinson Discharge date and time: 10/03/19   Activities/Precautions/ Functional Status: Activity: activity as tolerated Diet: regular diet Wound Care: Keep wound clean and dry.             Sacral area: Need to side lie when in bed and boost every 20-30 minutes when in chair. Cleanse are with soap and water. Pat dry, apply skin prep then hydrocolloid dressing to gluteal cleft (this makes a waterproof barrier). Change daily.    Functional status:  ___ No restrictions     ___ Walk up steps independently _X__ 24/7 supervision/assistance   ___ Walk up steps with assistance ___ Intermittent supervision/assistance  ___ Bathe/dress independently ___ Walk with walker     _X__ Bathe/dress with assistance ___ Walk Independently    ___ Shower independently ___ Walk with assistance    ___ Shower with assistance _X__ No alcohol     ___ Return to work/school ________   Special Instructions: 1. Perform bowel program every night 45 minutes after supper.  2. Perform foley care twice a day as per instructions.     COMMUNITY REFERRALS UPON DISCHARGE:    Home Health:   PT    OT     SNA                  Agency: [facility] Phone: [phone] *Please expect follow-up to schedule your home visit and start of care to begin on 10/05/2019. If you have not received follow-up, be sure to follow-up with the branch to directly.*    Medical Equipment/Items Ordered: specialty wheelchair                                                 Agency/Supplier: [facility] [phone]  Medical Equipment/Items Ordered: hospital bed with low airloss mattress                                                 Agency/Supplier: [facility] [employer] [phone]  My questions have been answered and I understand these instructions. I will adhere to these goals and the provided educational materials after my discharge from the hospital.  Patient/Caregiver Signature  _______________________________ Date __________  Clinician Signature _______________________________________ Date __________  Please bring this form and your medication list with you to all your follow-up doctor's appointments.   Information on my medicine - ELIQUIS (apixaban)  This medication education was reviewed with me or my healthcare representative as part of my discharge preparation.    Why was Eliquis prescribed for you? Eliquis was prescribed to treat blood clots that may have been found in the veins of your legs (deep vein thrombosis) or in your lungs (pulmonary embolism) and to reduce the risk of them occurring again.  What do You need to know about Eliquis ? The starting dose is 10 mg (two 5 mg tablets) taken TWICE daily for the FIRST SEVEN (7) DAYS, then on (enter date)  8/13  the dose is reduced to ONE 5 mg tablet taken TWICE daily.  Eliquis may be taken with or without food.   Try to take the dose about the same time in the morning and in the evening. If you have  difficulty swallowing the tablet whole please discuss with your pharmacist how to take the medication safely.  Take Eliquis exactly as prescribed and DO NOT stop taking Eliquis without talking to the doctor who prescribed the medication.  Stopping may increase your risk of developing a new blood clot.  Refill your prescription before you run out.  After discharge, you should have regular check-up appointments with your healthcare provider that is prescribing your Eliquis.    What do you do if you miss a dose? If a dose of ELIQUIS is not taken at the scheduled time, take it as soon as possible on the same day and twice-daily administration should be resumed. The dose should not be doubled to make up for a missed dose.  Important Safety Information A possible side effect of Eliquis is bleeding. You should call your healthcare provider right away if you experience any of the following: ? Bleeding from an  injury or your nose that does not stop. ? Unusual colored urine (red or dark brown) or unusual colored stools (red or black). ? Unusual bruising for unknown reasons. ? A serious fall or if you hit your head (even if there is no bleeding).  Some medicines may interact with Eliquis and might increase your risk of bleeding or clotting while on Eliquis. To help avoid this, consult your healthcare provider or pharmacist prior to using any new prescription or non-prescription medications, including herbals, vitamins, non-steroidal anti-inflammatory drugs (NSAIDs) and supplements.  This website has more information on Eliquis (apixaban): http://www.eliquis.com/eliquis/home ===================================================================  Deep Vein Thrombosis    Deep vein thrombosis (DVT) is a condition in which a blood clot forms in a deep vein, such as a lower leg, thigh, or arm vein. A clot is blood that has thickened into a gel or solid. This condition is dangerous. It can lead to serious and even life-threatening complications if the clot travels to the lungs and causes a blockage (pulmonary embolism). It can also damage veins in the leg. This can result in leg pain, swelling, discoloration, and sores (post-thrombotic syndrome).  What are the causes? This condition may be caused by:  A slowdown of blood flow.  Damage to a vein.  A condition that causes blood to clot more easily, such as an inherited clotting disorder.   What increases the risk? The following factors may make you more likely to develop this condition: 1. Being overweight. 2. Being older, especially over age 77. 3. Sitting or lying down for more than four hours. 4. Being in the hospital. 5. Lack of physical activity (sedentary lifestyle). 6. Pregnancy, being in childbirth, or having recently given birth. 7. Taking medicines that contain estrogen, such as medicines to prevent pregnancy. 8. Smoking. 9. A history of  any of the following: ? Blood clots or a blood clotting disease. ? Peripheral vascular disease. ? Inflammatory bowel disease. ? Cancer. ? Heart disease. ? Genetic conditions that affect how your blood clots, such as Factor V Leiden mutation. ? Neurological diseases that affect your legs (leg paresis). ? A recent injury, such as a car accident. ? Major or lengthy surgery. ? A central line placed inside a large vein.  What are the signs or symptoms? Symptoms of this condition include:  Swelling, pain, or tenderness in an arm or leg.  Warmth, redness, or discoloration in an arm or leg. If the clot is in your leg, symptoms may be more noticeable or worse when you stand or walk. Some people may not develop any  symptoms.  How is this diagnosed? This condition is diagnosed with: 1. A medical history and physical exam. 2. Tests, such as: ? Blood tests. These are done to check how well your blood clots. ? Ultrasound. This is done to check for clots. ? Venogram. For this test, contrast dye is injected into a vein and X-rays are taken to check for any clots  How is this treated? Treatment for this condition depends on:  The cause of your DVT.  Your risk for bleeding or developing more clots.  Any other medical conditions that you have. Treatment may include: 1. Taking a blood thinner (anticoagulant). This type of medicine prevents clots from forming. It may be taken by mouth, injected under the skin, or injected through an IV (catheter). 2. Injecting clot-dissolving medicines into the affected vein (catheter-directed thrombolysis). 3. Having surgery. Surgery may be done to: ? Remove the clot. ? Place a filter in a large vein to catch blood clots before they reach the lungs. Some treatments may be continued for up to six months.  Follow these instructions at home: If you are taking blood thinners: 1. Take the medicine exactly as told by your health care provider. Some blood thinners  need to be taken at the same time every day. Do not skip a dose. 2. Talk with your health care provider before you take any medicines that contain aspirin or NSAIDs. These medicines increase your risk for dangerous bleeding. 3. Ask your health care provider about foods and drugs that could change the way the medicine works (may interact). Avoid those things if your health care provider tells you to do so. 4. Blood thinners can cause easy bruising and may make it difficult to stop bleeding. Because of this: ? Be very careful when using knives, scissors, or other sharp objects. ? Use an electric razor instead of a blade. ? Avoid activities that could cause injury or bruising, and follow instructions about how to prevent falls. 5. Wear a medical alert bracelet or carry a card that lists what medicines you take.  General instructions  Take over-the-counter and prescription medicines only as told by your health care provider.  Return to your normal activities as told by your health care provider. Ask your health care provider what activities are safe for you.  Wear compression stockings if recommended by your health care provider.  Keep all follow-up visits as told by your health care provider. This is important.  How is this prevented? To lower your risk of developing this condition again: 1. For 30 or more minutes every day, do an activity that: ? Involves moving your arms and legs. ? Increases your heart rate. 2. When traveling for longer than four hours: ? Exercise your arms and legs every hour. ? Drink plenty of water. ? Avoid drinking alcohol. 3. Avoid sitting or lying for a long time without moving your legs. 4. If you have surgery or you are hospitalized, ask about ways to prevent blood clots. These may include taking frequent walks or using anticoagulants. 5. Stay at a healthy weight. 6. If you are a woman who is older than age 29, avoid unnecessary use of medicines that contain  estrogen, such as some birth control pills. 7. Do not use any products that contain nicotine or tobacco, such as cigarettes and e-cigarettes. This is especially important if you take estrogen medicines. If you need help quitting, ask your health care provider.  Contact a health care provider if:  You miss  a dose of your blood thinner.  Your menstrual period is heavier than usual.  You have unusual bruising.  Get help right away if: 1. You have: ? New or increased pain, swelling, or redness in an arm or leg. ? Numbness or tingling in an arm or leg. ? Shortness of breath. ? Chest pain. ? A rapid or irregular heartbeat. ? A severe headache or confusion. ? A cut that will not stop bleeding. 2. There is blood in your vomit, stool, or urine. 3. You have a serious fall or accident, or you hit your head. 4. You feel light-headed or dizzy. 5. You cough up blood.  These symptoms may represent a serious problem that is an emergency. Do not wait to see if the symptoms will go away. Get medical help right away. Call your local emergency services (911 in the [place] Do not drive yourself to the hospital. Summary  Deep vein thrombosis (DVT) is a condition in which a blood clot forms in a deep vein, such as a lower leg, thigh, or arm vein.  Symptoms can include swelling, warmth, pain, and redness in your leg or arm.  This condition may be treated with a blood thinner (anticoagulant medicine), medicine that is injected to dissolve blood clots,compression stockings, or surgery.  If you are prescribed blood thinners, take them exactly as told. This information is not intended to replace advice given to you by your health care provider. Make sure you discuss any questions you have with your health care provider. Document Revised: 12/31/2016 Document Reviewed: 06/18/2016 Elsevier Patient Education  2020 [employer].

## 2019-09-11 NOTE — Progress Notes (Signed)
Occupational Therapy Session Note  Patient Details  Name: Haley Robinson MRN: [id number] Date of Birth: May 28, 1940  Today's Date: 09/11/2019 OT Individual Time: 0800-0900 OT Individual Time Calculation (min): 60 min    Short Term Goals: Week 2:  OT Short Term Goal 1 (Week 2): Patient will thread 1 LE through LB clothing in circle sitting with use of compensatory technique in prep for completion of LB BADLs. OT Short Term Goal 2 (Week 2): Patient will demonstrate squat-pivot transfers with Max A. OT Short Term Goal 3 (Week 2): Pt will don pull over shirt with min A OT Short Term Goal 4 (Week 2): Pt perform toileting tasks with max A  Skilled Therapeutic Interventions/Progress Updates:    Pt asleep in bed upon arrival but easily aroused.  Pt stated she didn't sleep well because of sacral pain. Pt agreeable to getting OOB and completing bathing/dressing tasks. Pt incontinent of bladder and required max A for toileting tasks at bed level. LB dressing with tot A. Supine>sit EOB with max A. Sitting balance EOB with min A. SB transfer to w/c with max A+2. Pt completed UB bathing/dressing tasks seated in w/c with min A for donning pullover shirt. Pt completed grooming tasks with supervision/setup.   Therapy Documentation Precautions:  Precautions Precautions: Fall, Cervical Precaution Booklet Issued: Yes (comment) Precaution Comments: reviewed precautions Required Braces or Orthoses: Cervical Brace Cervical Brace: Soft collar (when OOB) Restrictions Weight Bearing Restrictions: No Pain: Pt c/o 5/10 sacral pain; repositioned, RN aware   Therapy/Group: Individual Therapy  Rich Brave 09/11/2019, 9:01 AM

## 2019-09-11 NOTE — Progress Notes (Signed)
[place] PHYSICAL MEDICINE & REHABILITATION PROGRESS NOTE   Subjective/Complaints:  No documentation of bowel program last night-  2 BMs in flowsheet- med-large- after dinner/night.   Also c/o butt hurting where has ulcer- shoulders also hurt "a little".     ROS:   Pt denies SOB, abd pain, CP, N/V/C/D, and vision changes   Objective:   No results found. Recent Labs    09/10/19 0519  WBC 12.8*  HGB 11.7*  HCT 36.2  PLT 276   Recent Labs    09/10/19 0519  NA 141  K 3.6  CL 102  CO2 30  GLUCOSE 114*  BUN 22  CREATININE 0.82  CALCIUM 8.9    Intake/Output Summary (Last 24 hours) at 09/11/2019 0835 Last data filed at 09/10/2019 1832 Gross per 24 hour  Intake 440 ml  Output --  Net 440 ml     Physical Exam: Vital Signs Blood pressure 128/73, pulse (!) 54, temperature 97.9 F (36.6 C), resp. rate 16, height 5\' 5"  (1.651 m), weight 63.6 kg, SpO2 97 %. Gen.: No acute distress-sitting up straight up in bed- on ulcer, NAD Mood and affect are appropriate Heart RRR Lungs CTA B/L- no W/R/R- good air movement Abdomen Soft, NT, ND, (+)BS  Extremities no clubbing cyanosis or edema Skin: 2-2.5 cm diameter Stage II pressure ulcer- draining yellow clearish drainage- covered with dressing/foam-   Neurologic: Tone mild hyperreflexia right lower extremity no clonus at the ankles bilaterally no evidence of flexor withdrawal Motor strength 4+ bilateral deltoid bicep 4 - bilateral tricep trace finger flexors bilaterally trace finger extensors bilaterally 0 hand intrinsics bilaterally Right lower extremity to minus hip extension 0 quadriceps 0 ankle dorsiflexion plantarflexion. Can wiggle toes.  Left lower extremity 5/5 in the hip flexor knee extensor ankle dorsiflexor and plantar flexor unchanged Musculoskeletal: No pain with active assisted as well as passive range of motion in the upper lower limb no joint swelling Sensation intact throughout  Assessment/Plan: 1.  Functional deficits secondary to tetraparesis secondary to cervical myelopathy which require 3+ hours per day of interdisciplinary therapy in a comprehensive inpatient rehab setting.  Physiatrist is providing close team supervision and 24 hour management of active medical problems listed below.  Physiatrist and rehab team continue to assess barriers to discharge/monitor patient progress toward functional and medical goals  Care Tool:  Bathing    Body parts bathed by patient: Right arm, Left arm, Chest, Abdomen, Front perineal area, Right upper leg, Left upper leg, Face   Body parts bathed by helper: Buttocks, Right lower leg, Left lower leg     Bathing assist Assist Level: Moderate Assistance - Patient 50 - 74%     Upper Body Dressing/Undressing Upper body dressing   What is the patient wearing?: Pull over shirt    Upper body assist Assist Level: Minimal Assistance - Patient > 75%    Lower Body Dressing/Undressing Lower body dressing      What is the patient wearing?: Incontinence brief, Pants     Lower body assist Assist for lower body dressing: Maximal Assistance - Patient 25 - 49%     Toileting Toileting    Toileting assist Assist for toileting: Maximal Assistance - Patient 25 - 49%     Transfers Chair/bed transfer  Transfers assist     Chair/bed transfer assist level: 2 Helpers (slide board)     Locomotion Ambulation   Ambulation assist   Ambulation activity did not occur: Safety/medical concerns  Walk 10 feet activity   Assist  Walk 10 feet activity did not occur: Safety/medical concerns        Walk 50 feet activity   Assist Walk 50 feet with 2 turns activity did not occur: Safety/medical concerns         Walk 150 feet activity   Assist Walk 150 feet activity did not occur: Safety/medical concerns         Walk 10 feet on uneven surface  activity   Assist Walk 10 feet on uneven surfaces activity did not occur:  Safety/medical concerns         Wheelchair     Assist Will patient use wheelchair at discharge?: Yes Type of Wheelchair:  (TBD pending progress)    Wheelchair assist level: Dependent - Patient 0% Max wheelchair distance: 150'    Wheelchair 50 feet with 2 turns activity    Assist        Assist Level: Dependent - Patient 0%   Wheelchair 150 feet activity     Assist      Assist Level: Dependent - Patient 0%   Blood pressure 128/73, pulse (!) 54, temperature 97.9 F (36.6 C), resp. rate 16, height 5\' 5"  (1.651 m), weight 63.6 kg, SpO2 97 %.  Medical Problem List and Plan: 1.   Tetraparesis secondary to cervical myelopathy -patient may  shower -ELOS/Goals: 20 to 24 days  -Continue CIR 2. Antithrombotics: -DVT/anticoagulation:Mechanical:Sequential compression devices, below kneeBilateral lower extremities. Bilateral lower extremity DVTs, age indeterminate, on . Started on Eliquis.   8/9- needs at least 3 months -antiplatelet therapy: N/A 3. Pain Management:Well controlled with prn Tylenol 4. Mood:LCSW to follow for evaluation and support. -antipsychotic agents: N/A 5. Neuropsych: This patient is capable of making decisions on her own behalf. 6. Skin/Wound Care:Monitor wound for healing. Will add protein supplement and multivitamin to promote healing.  8/10- has Stage II pressure ulcer- 2-2.5 cm diameter stage II pressure ulcer- will add turn q2 hours- also informed nurse if up in w/c, has to do pressure relief q30 minutes 7. Fluids/Electrolytes/Nutrition:Monitor I/O. Check lytes in am.  8. HTN: Monitor BP tid--continue HCTZ daily.Check BMET/K+ level in am. 9. Post op labs: Pre-op K+ low normal.   8/9- K+ 3.6 still 10 Neurogenic bladder: Has been incontinent with external catheter--will monitor voiding with PVRs/bladder scan. UA negative. Continue Toviaz 4mg  daily  8/9- U/A was (-) 8/3- same time  also had a elevated WBC. Will monitor/WBC 11.Neurogenic bowel: KUB shows large stool burden and no obstruction. Had BM with enema on admission. On daily Miralax, colace TID, Senna HS. Mag citrate x1 on 8/6, bisacodyl 10mg  supp qpm   8/10- had 2 large BMs with bowel program?- not documented 12. Insomnia: Trazodone 100mg  did not provide benefit Try Amitriptyline 25mg .  13. Leukocytosis: WBC decreased to 12.4. UA/UC negative.   8/9- WBC 12.8- basically stable- con't to monitor 2x/week.  14. Bradycardia  8/9- only BP med HCTZ- so is baseline for pt- will monitor       LOS: 8 days A FACE TO FACE EVALUATION WAS PERFORMED  Amaro Mangold 09/11/2019, 8:35 AM

## 2019-09-11 NOTE — Progress Notes (Signed)
Occupational Therapy Session Note  Patient Details  Name: Haley Robinson MRN: [id number] Date of Birth: [birth year]  Today's Date: 09/11/2019 OT Individual Time: 1345-1430 OT Individual Time Calculation (min): 45 min    Short Term Goals: Week 2:  OT Short Term Goal 1 (Week 2): Patient will thread 1 LE through LB clothing in circle sitting with use of compensatory technique in prep for completion of LB BADLs. OT Short Term Goal 2 (Week 2): Patient will demonstrate squat-pivot transfers with Max A. OT Short Term Goal 3 (Week 2): Pt will don pull over shirt with min A OT Short Term Goal 4 (Week 2): Pt perform toileting tasks with max A  Skilled Therapeutic Interventions/Progress Updates:    OT intervention with focus on bed mobility, sitting balance, squat pivot transfers, BUE function, and activity tolerance to increase independence with BADLs. Supine>sit EOB with max A. Squat pivot transfer with max A+2. Pt transferred to [facility] and engaged in sitting activities placing pegs in peg board. Pt required min A/mod A when reaching outside BOS and across midline. Pt able to pick up large pegs with L hand but required adaptation ([place]) to pegs to facilitate picking up with R hand. Pt required rest breaks X 5 during activity. Pt noted with increased difficulty with controlled transitional movements. Pt remained in w/c with all needs within reach.  Pt's daughter, Kennith [place], present.   Therapy Documentation Precautions:  Precautions Precautions: Fall, Cervical Precaution Booklet Issued: Yes (comment) Precaution Comments: reviewed precautions Required Braces or Orthoses: Cervical Brace Cervical Brace: Soft collar (when OOB) Restrictions Weight Bearing Restrictions: No Pain:  Pt stated her "bottom" was feeling better then earlier in the day; repositioned in TIS w/c  Therapy/Group: Individual Therapy  Rich Brave 09/11/2019, 2:48 PM

## 2019-09-11 NOTE — Progress Notes (Signed)
Physical Therapy Session Note  Patient Details  Name: Haley Robinson MRN: [id number] Date of Birth: 02/23/1940  Today's Date: 09/11/2019 PT Individual Time: 1000-1100 PT Individual Time Calculation (min): 60 min   Short Term Goals: Week 1:  PT Short Term Goal 1 (Week 1): Pt will perform rolling with min A consistently PT Short Term Goal 2 (Week 1): Pt will perform bed mobility with assist x 1 consistently PT Short Term Goal 3 (Week 1): Pt will maintain static sitting balance x 5 min with mod A  Skilled Therapeutic Interventions/Progress Updates:    Pt received seated in w/c in room, agreeable to PT session. Pt reports ongoing soreness in her sacrum at site of wound. Reviewed pressure relief schedule with patient (every 30 min for 2 min) and will provide pt with timer and boosting schedule check-off. Pt's daughter has been trained on how to assist pt with pressure relief using TIS chair and nursing aware that pt needs to be boosted. Dependent transport via TIS w/c to/from therapy gym. Slide board transfer w/c to/from mat table with assist x 2 for safety. Forwards/backwards scooting while seated on EOM table with assist needed for trunk control. Pt is able to scoot her L hip forwards/backwards but requires assist with R due to weakness. Remainder of session focus on core strengthening and seated balance on EOM with use of mirror for visual feedback for positioning. Seated crunches 2 x 10 reps with min to mod A for trunk control and visual targets for anterior leaning. Pt continues to exhibit fair trunk control and exhibits poor ability to eccentrically control trunk position. Seated reaching task utilizing alt UE reaching outside BOS and across midline with mod to max A for sitting balance and manual cues for anterior leaning. Seated ball toss with min to mod A for trunk control with focus on upright posture and maintaining balance without use of UE, 2 x 20 reps. Pt left semi-reclined in TIS w/c in room  with needs in reach, quick release belt and chair alarm in place at end of session.  Therapy Documentation Precautions:  Precautions Precautions: Fall, Cervical Precaution Booklet Issued: Yes (comment) Precaution Comments: reviewed precautions Required Braces or Orthoses: Cervical Brace Cervical Brace: Soft collar (when OOB) Restrictions Weight Bearing Restrictions: No    Therapy/Group: Individual Therapy   Peter Congo, PT, DPT  09/11/2019, 11:53 AM

## 2019-09-11 NOTE — Progress Notes (Signed)
Dig stim and dulcolax suppository given at 1805. Discussed bowel program with pt's daughter [name] and pt. RN demonstrated dig stim and purpose of dig stim to daughter and pt. Daughter is attentive and receptive of education. Explained to daughter that education will be ongoing and she will need to be hands on as pt continues with rehab services. Daughter is in agreement.   Pt had medium BMs at 1850 (incontinent on brief) and 1910 (on bedpan). Repositioned in bed and reported off to oncoming staff. Continue plan of care.   Marylu Lund, RN

## 2019-09-11 NOTE — Progress Notes (Signed)
Patient ID: Haley Robinson, female   DOB: Jan 24, 1941, 79 y.o.   MRN: [id number]  SW met with pt and pt dtr Olivia Mackie in room to provide updates from team conference, and d/c date 9/1. Pt dtr states that they are working on finding individuals who can sit with pt in the morning until she is off work (until about 2pm) and then she will remain with her until her sister Ebony Hail arrives home from work.  SW discussed caregiver education a week prior to discharge. She will speak with her sister Ebony Hail about her scheduling a day for fam edu since she has to take time off from work. SW will follow-up.   Loralee Pacas, MSW, [place] Office: [phone] Cell: [phone] Fax: [phone]

## 2019-09-12 ENCOUNTER — Inpatient Hospital Stay (HOSPITAL_COMMUNITY): Payer: Medicare PPO | Admitting: *Deleted

## 2019-09-12 ENCOUNTER — Inpatient Hospital Stay (HOSPITAL_COMMUNITY): Payer: Medicare PPO | Admitting: Physical Therapy

## 2019-09-12 ENCOUNTER — Inpatient Hospital Stay (HOSPITAL_COMMUNITY): Payer: Medicare PPO

## 2019-09-12 NOTE — Progress Notes (Addendum)
Patient bowel program started after dinner, dig stim preformed no bowel returned. Suppository inserted, patient dig stimulated preformed x2 during shift patient sat on bed pan. Patient had a large loose watery bowel movement. Kalman Shan, LPN

## 2019-09-12 NOTE — Progress Notes (Signed)
PHYSICAL MEDICINE & REHABILITATION PROGRESS NOTE   Subjective/Complaints:  Had good BMx2 with bowel program last night- nursing also taught daughter how to do- so family ed was done.   Pt said someone looked at her buttocks and told her wound looked worse yesterday- when pt seen today- was sitting straight up/on sacral wound directly- since just finished eating breakfast.     ROS:   Pt denies SOB, abd pain, CP, N/V/C/D, and vision changes  Objective:   No results found. Recent Labs    09/10/19 0519  WBC 12.8*  HGB 11.7*  HCT 36.2  PLT 276   Recent Labs    09/10/19 0519  NA 141  K 3.6  CL 102  CO2 30  GLUCOSE 114*  BUN 22  CREATININE 0.82  CALCIUM 8.9    Intake/Output Summary (Last 24 hours) at 09/12/2019 0816 Last data filed at 09/11/2019 1825 Gross per 24 hour  Intake 450 ml  Output --  Net 450 ml     Physical Exam: Vital Signs Blood pressure (!) 120/46, pulse (!) 58, temperature 98 F (36.7 C), temperature source Oral, resp. rate 18, height 5\' 5"  (1.651 m), weight 63.6 kg, SpO2 95 %. Gen.: No acute distress-sitting up straight in bed- on ulcer- after breakfast; laid her down- helped buttock pain, NAD Mood and affect are appropriate Heart borderline bradycardia- regular rhythm Lungs CTA B/L- no W/R/R- good air movement Abdomen Soft, NT, ND, (+)BS  Extremities no clubbing cyanosis or edema Skin: 2-2.5 cm diameter Stage II pressure ulcer- draining yellow clearish drainage- covered with dressing/foam- not assessed today  Neurologic: Tone mild hyperreflexia right lower extremity no clonus at the ankles bilaterally no evidence of flexor withdrawal Motor strength 4+ bilateral deltoid bicep 4 - bilateral tricep trace finger flexors bilaterally trace finger extensors bilaterally 0 hand intrinsics bilaterally Right lower extremity to minus hip extension 0 quadriceps 0 ankle dorsiflexion plantarflexion. Can wiggle toes.  Left lower extremity 5/5 in the  hip flexor knee extensor ankle dorsiflexor and plantar flexor unchanged Musculoskeletal: No pain with active assisted as well as passive range of motion in the upper lower limb no joint swelling Sensation intact throughout  Assessment/Plan: 1. Functional deficits secondary to tetraparesis secondary to cervical myelopathy which require 3+ hours per day of interdisciplinary therapy in a comprehensive inpatient rehab setting.  Physiatrist is providing close team supervision and 24 hour management of active medical problems listed below.  Physiatrist and rehab team continue to assess barriers to discharge/monitor patient progress toward functional and medical goals  Care Tool:  Bathing    Body parts bathed by patient: Right arm, Left arm, Chest, Abdomen, Front perineal area, Right upper leg, Left upper leg, Face   Body parts bathed by helper: Buttocks, Right lower leg, Left lower leg     Bathing assist Assist Level: Moderate Assistance - Patient 50 - 74%     Upper Body Dressing/Undressing Upper body dressing   What is the patient wearing?: Pull over shirt    Upper body assist Assist Level: Minimal Assistance - Patient > 75%    Lower Body Dressing/Undressing Lower body dressing      What is the patient wearing?: Incontinence brief, Pants     Lower body assist Assist for lower body dressing: Maximal Assistance - Patient 25 - 49%     Toileting Toileting    Toileting assist Assist for toileting: Maximal Assistance - Patient 25 - 49%     Transfers Chair/bed transfer  Transfers  assist     Chair/bed transfer assist level: 2 Helpers     Locomotion Ambulation   Ambulation assist   Ambulation activity did not occur: Safety/medical concerns          Walk 10 feet activity   Assist  Walk 10 feet activity did not occur: Safety/medical concerns        Walk 50 feet activity   Assist Walk 50 feet with 2 turns activity did not occur: Safety/medical  concerns         Walk 150 feet activity   Assist Walk 150 feet activity did not occur: Safety/medical concerns         Walk 10 feet on uneven surface  activity   Assist Walk 10 feet on uneven surfaces activity did not occur: Safety/medical concerns         Wheelchair     Assist Will patient use wheelchair at discharge?: Yes Type of Wheelchair:  (TBD pending progress)    Wheelchair assist level: Dependent - Patient 0% Max wheelchair distance: 150'    Wheelchair 50 feet with 2 turns activity    Assist        Assist Level: Dependent - Patient 0%   Wheelchair 150 feet activity     Assist      Assist Level: Dependent - Patient 0%   Blood pressure (!) 120/46, pulse (!) 58, temperature 98 F (36.7 C), temperature source Oral, resp. rate 18, height 5\' 5"  (1.651 m), weight 63.6 kg, SpO2 95 %.  Medical Problem List and Plan: 1.   Tetraparesis secondary to cervical myelopathy -patient may  shower -ELOS/Goals: 20 to 24 days  -Continue CIR 2. Antithrombotics: -DVT/anticoagulation:Mechanical:Sequential compression devices, below kneeBilateral lower extremities. Bilateral lower extremity DVTs, age indeterminate, on . Started on Eliquis.   8/9- needs at least 3 months -antiplatelet therapy: N/A 3. Pain Management:Well controlled with prn Tylenol 4. Mood:LCSW to follow for evaluation and support. -antipsychotic agents: N/A 5. Neuropsych: This patient is capable of making decisions on her own behalf. 6. Skin/Wound Care:Monitor wound for healing. Will add protein supplement and multivitamin to promote healing.  8/10- has Stage II pressure ulcer- 2-2.5 cm diameter stage II pressure ulcer- will add turn q2 hours- also informed nurse if up in w/c, has to do pressure relief q30 minutes  8/11- laid pt down in bed to get off ulcer- helped pain as well- reinforced being turned regularly.  7.  Fluids/Electrolytes/Nutrition:Monitor I/O. Check lytes in am.  8. HTN: Monitor BP tid--continue HCTZ daily.Check BMET/K+ level in am. 9. Post op labs: Pre-op K+ low normal.   8/9- K+ 3.6 still 10 Neurogenic bladder: Has been incontinent with external catheter--will monitor voiding with PVRs/bladder scan. UA negative. Continue Toviaz 4mg  daily  8/9- U/A was (-) 8/3- same time also had a elevated WBC. Will monitor/WBC  8/11- will check labs tomorrow 11.Neurogenic bowel: KUB shows large stool burden and no obstruction. Had BM with enema on admission. On daily Miralax, colace TID, Senna HS. Mag citrate x1 on 8/6, bisacodyl 10mg  supp qpm   8/10- had 2 large BMs with bowel program?- not documented  8/11- documented 2 BMs with bowel program- family ed also done.  12. Insomnia: Trazodone 100mg  did not provide benefit Try Amitriptyline 25mg .  13. Leukocytosis: WBC decreased to 12.4. UA/UC negative.   8/9- WBC 12.8- basically stable- con't to monitor 2x/week.  14. Bradycardia  8/9- only BP med HCTZ- so is baseline for pt- will monitor  8/11- running 58-60- is  likely due to SCI- is common- asymptomatic.        LOS: 9 days A FACE TO FACE EVALUATION WAS PERFORMED  Joelys Staubs 09/12/2019, 8:16 AM

## 2019-09-12 NOTE — Progress Notes (Signed)
Physical Therapy Session Note  Patient Details  Name: Haley Robinson MRN: [id number] Date of Birth: 1940-09-03  Today's Date: 09/12/2019 PT Individual Time: 3354-5625 and 6389-3734 PT Individual Time Calculation (min): 55 min and 31 mins  Short Term Goals: Week 1:  PT Short Term Goal 1 (Week 1): Pt will perform rolling with min A consistently PT Short Term Goal 1 - Progress (Week 1): Met PT Short Term Goal 2 (Week 1): Pt will perform bed mobility with assist x 1 consistently PT Short Term Goal 2 - Progress (Week 1): Progressing toward goal PT Short Term Goal 3 (Week 1): Pt will maintain static sitting balance x 5 min with mod A PT Short Term Goal 3 - Progress (Week 1): Met Week 2:  PT Short Term Goal 1 (Week 2): Pt will perform least restricive transfer with assist x 1 consistently PT Short Term Goal 2 (Week 2): Pt will perform w/c mobility x 50 ft with LRAD PT Short Term Goal 3 (Week 2): Pt will perform bed mobility with assist x 1 consistently  Skilled Therapeutic Interventions/Progress Updates:    SESSION 1: pt received in [facility] and agreeable to therapy. Pt taken dependently to gym in [facility] for time management, pt directed in slide board transfer to mat table, max A for transfer and board placement. Once sitting edge of mat table, pt directed in dynamic sitting balance activity for improved righting reactions and trunk strength in sitting: 2x10 trunk leans to R and L with min A to achieve and mod A to return to midline on L side, min A to return on R side; trunk rotations with R and L cross midline with alternating UE with pt improving on second set to min A on L and CGA on R with VC and visual targets for reaching distance; pt directed on 2x10 forward crunches from slightly posterior to midline to anterior to midline with min A-mod A to correct forward direction LOB intermittently, visual target improved pt's overall technique with forward direction distance and control.  2x10 L LE marching  with initially assist to complete however improved to pt able to clear mat with thigh and complete for improved transfers. Pt directed in slide board to return to chair, max A. Dependently taken to room in [facility] for time management, pt requested to return to bed, slide board transfer to bed, max A to complete and place board, sit>supine max A for BLE management and assistance with trunk placement. Pt directed in x2 rolls to R side for improved skin integrity and comfort. Pt left in bed, alarm set, 3 rails up, All needs in reach and in good condition. Call light in hand.    SESSION 2: pt received in bed and agreeable to therapy. Pt reported feeling exhausted this date and agreeable to therapy in room ultimately deciding to participate in bed however pt continues to be motivated to participate overall and apologetic that she wasn't able to do more this date. Pt directed in x4 long sit from slightly elevated HOB position with use of BUE to loop bed rails or therapist arm to pull self into long sitting and able to hold each time for several minutes with VC for increased trunk extension and improved head carriage at min A-CGA for support as needed. Pt required rest breaks between reps and during rest break asked about hospital bed for home, PT explained that currently it would benefit pt to have a hospital bed for home, pt then had  questions about other equipment for home and mobility. Pt educated at possible use of other options for mobility as well with WC vs. Power chair and this could be evaluated with pt to see if she is appropriate for power chair. Pt unsure about use of power chair but open to learning about them and her needs at this time. Pt educated that it is ultimately her decision and she was agreeable to learn more. Pt left in bed, alarm set, positioned with pillows for skin integrity and comfort, All needs in reach and in good condition. Call light in hand.    Therapy Documentation Precautions:   Precautions Precautions: Fall, Cervical Precaution Booklet Issued: Yes (comment) Precaution Comments: reviewed precautions Required Braces or Orthoses: Cervical Brace Cervical Brace: Soft collar (when OOB) Restrictions Weight Bearing Restrictions: No Pain: Pain Assessment Pain Scale: 0-10 Pain Score: 0-No pain    Therapy/Group: Individual Therapy  Junie Panning 09/12/2019, 12:50 PM

## 2019-09-12 NOTE — Progress Notes (Signed)
Occupational Therapy Session Note  Patient Details  Name: Haley Robinson MRN: [id number] Date of Birth: [birth year]  Today's Date: 09/12/2019 OT Individual Time: 1300-1325 OT Individual Time Calculation (min): 25 min    Short Term Goals: Week 2:  OT Short Term Goal 1 (Week 2): Patient will thread 1 LE through LB clothing in circle sitting with use of compensatory technique in prep for completion of LB BADLs. OT Short Term Goal 2 (Week 2): Patient will demonstrate squat-pivot transfers with Max A. OT Short Term Goal 3 (Week 2): Pt will don pull over shirt with min A OT Short Term Goal 4 (Week 2): Pt perform toileting tasks with max A  Skilled Therapeutic Interventions/Progress Updates:    Pt resting in bed upon arrival.  Pt stated she would like to return to bed at end of scheduled session.  OT focus on bed level BUE/hand activities with focus on increased RUE/hand function.  Pt with weak R hand grasp employing tenodesis to facilitate finger flexion.  Pt unable to functionally grasp with R hand. Pt is left handed and uses her L hand for self feeding and grooming tasks. Pt already had foam for utensils.  Coban added to foam for improved grasp.  Pt remained in bed with all needs within reach and bed alarm activated.   Therapy Documentation Precautions:  Precautions Precautions: Fall, Cervical Precaution Booklet Issued: Yes (comment) Precaution Comments: reviewed precautions Required Braces or Orthoses: Cervical Brace Cervical Brace: Soft collar (when OOB) Restrictions Weight Bearing Restrictions: No  Pain: Pain Assessment Pain Scale: 0-10 Pain Score: 0-No pain   Therapy/Group: Individual Therapy  Rich Brave 09/12/2019, 1:29 PM

## 2019-09-12 NOTE — Progress Notes (Signed)
Occupational Therapy Session Note  Patient Details  Name: Haley Robinson MRN: [id number] Date of Birth: May 07, 1940  Today's Date: 09/12/2019 OT Individual Time: 7616-0737 OT Individual Time Calculation (min): 70 min    Short Term Goals: Week 2:  OT Short Term Goal 1 (Week 2): Patient will thread 1 LE through LB clothing in circle sitting with use of compensatory technique in prep for completion of LB BADLs. OT Short Term Goal 2 (Week 2): Patient will demonstrate squat-pivot transfers with Max A. OT Short Term Goal 3 (Week 2): Pt will don pull over shirt with min A OT Short Term Goal 4 (Week 2): Pt perform toileting tasks with max A  Skilled Therapeutic Interventions/Progress Updates:    Pt resting in bed upon arrival and agreeable to therapy.  Pt with flat affect again today. OT intervention with focus on bed mobility, sitting balance, SB transfers, LB bathing/dressing at bed level, UB bathing/dressing seated in w/c, BUE/FMC activities for improved functional use, and activity tolerance to increase independence with BADLs. Rolling R/L in bed with mod A.  Supine>sit EOB with max A. SB tranfser with tot A+2. LB bathing with mod A. LB dressing with tot A. UB bathing/dressing with min A seated in w/c at sink.  Grooming tasks with supervision/setup. Pt engaged in table activities with focus on R hand functional grasp using pegs and peg board. Pt completes tasks with more then a reasonable amount of time using gross grasp. Pt remained in w/c with all needs within reach and belt alarm activated.   Therapy Documentation Precautions:  Precautions Precautions: Fall, Cervical Precaution Booklet Issued: Yes (comment) Precaution Comments: reviewed precautions Required Braces or Orthoses: Cervical Brace Cervical Brace: Soft collar (when OOB) Restrictions Weight Bearing Restrictions: No   Pain:  Pt c/o that her "bottom" is still uncomfortable and she hasn't seen any change in pain  level   Therapy/Group: Individual Therapy  Rich Brave 09/12/2019, 9:30 AM

## 2019-09-12 NOTE — Evaluation (Signed)
Recreational Therapy Assessment and Plan  Patient Details  Name: Haley Robinson MRN: [id number] Date of Birth: Nov 20, 1940 Today's Date: 09/12/2019  Rehab Potential:  Fair ELOS:   d/c 9/1 Assessment  Hospital Problem: Principal Problem:   Cervical myelopathy ([place]) Active Problems:   Cervical arthritis with myelopathy   Past Medical History:      Past Medical History:  Diagnosis Date   Anxiety    w/ procedures   Carotid bruit    Hyperlipidemia    Hypertension    Numbness and tingling in both hands    Osteoarthritis    hands   Pneumonia    x 1 - over 30 yrs ago   PONV (postoperative nausea and vomiting)    Nausea with hysterectomy surgery only, no problems with other procedures/surgeries   Spinal stenosis    Vertigo, benign positional    x 4 years   Vitamin D deficiency    Past Surgical History:       Past Surgical History:  Procedure Laterality Date   ABDOMINAL HYSTERECTOMY  1981   ABDOMINAL HYSTERECTOMY     ANTERIOR CERVICAL DECOMP/DISCECTOMY FUSION N/A 08/28/2019   Procedure: Anterior Cervical Decompression/Discectomy Fusion - Cervical four-Cervical five - Cervical five-Cervical six - Cervical six-Cervical seven;  Surgeon: Earnie Larsson, MD;  Location: [place];  Service: Neurosurgery;  Laterality: N/A;   CATARACT EXTRACTION, BILATERAL  11/2018   COLONOSCOPY  06/2018   TONSILLECTOMY     WISDOM TOOTH EXTRACTION      Assessment & Plan Clinical Impression:  Haley Robinson is a 79 year old female with history of HTN, numbness and weakness in hands since 2000 felt to be due to cervical spinal stenosis. 1 month prior to admission, she started having progressive symptoms with gait instability and spasticity left greater than right side. Work-up done revealing severe cervical stenosis with myelopathy C4/5 to C6/7. She was admitted on 08/28/2019 for ACDF C4-C7 by Dr. Trenton Gammon. Postop has had weakness BLE with spasms RLE and  sensory deficits around C7. Neurosurgery added steroids for postop symptoms which would gradually improve a few weeks.Therapy ongoing and patient limited by quadriplegia and working on pregait activity/standing trials. CIR recommended due to functional decline. Patient transferred to CIR on 09/03/2019 .   Pt presents with decreased activity tolerance, decreased functional mobility, decreased balance, and decreased coordination Limiting pt's independence with leisure/community pursuits.  Plan  Min 1 TR session >20 minutes per week during LOS  Recommendations for other services: None   Discharge Criteria: Patient will be discharged from TR if patient refuses treatment 3 consecutive times without medical reason.  If treatment goals not met, if there is a change in medical status, if patient makes no progress towards goals or if patient is discharged from hospital.  The above assessment, treatment plan, treatment alternatives and goals were discussed and mutually agreed upon: by patient  Dakota Ridge 09/12/2019, 12:04 PM

## 2019-09-13 ENCOUNTER — Inpatient Hospital Stay (HOSPITAL_COMMUNITY): Payer: Medicare PPO | Admitting: Physical Therapy

## 2019-09-13 ENCOUNTER — Inpatient Hospital Stay (HOSPITAL_COMMUNITY): Payer: Medicare PPO | Admitting: *Deleted

## 2019-09-13 ENCOUNTER — Inpatient Hospital Stay (HOSPITAL_COMMUNITY): Payer: Medicare PPO

## 2019-09-13 LAB — BASIC METABOLIC PANEL
Anion gap: 10 (ref 5–15)
BUN: 17 mg/dL (ref 8–23)
CO2: 30 mmol/L (ref 22–32)
Calcium: 8.9 mg/dL (ref 8.9–10.3)
Chloride: 101 mmol/L (ref 98–111)
Creatinine, Ser: 0.76 mg/dL (ref 0.44–1.00)
GFR calc Af Amer: >60 mL/min (ref >60)
GFR calc non Af Amer: >60 mL/min (ref >60)
Glucose, Bld: 107 mg/dL — ABNORMAL HIGH (ref 70–99)
Potassium: 3.5 mmol/L (ref 3.5–5.1)
Sodium: 141 mmol/L (ref 135–145)

## 2019-09-13 LAB — CBC WITH DIFFERENTIAL/PLATELET
Abs Immature Granulocytes: 0.04 10*3/uL (ref 0.00–0.07)
Basophils Absolute: 0 10*3/uL (ref 0.0–0.1)
Basophils Relative: 0 %
Eosinophils Absolute: 0.2 10*3/uL (ref 0.0–0.5)
Eosinophils Relative: 2 %
HCT: 33.3 % — ABNORMAL LOW (ref 36.0–46.0)
Hemoglobin: 10.7 g/dL — ABNORMAL LOW (ref 12.0–15.0)
Immature Granulocytes: 0 %
Lymphocytes Relative: 22 %
Lymphs Abs: 2 10*3/uL (ref 0.7–4.0)
MCH: 31.4 pg (ref 26.0–34.0)
MCHC: 32.1 g/dL (ref 30.0–36.0)
MCV: 97.7 fL (ref 80.0–100.0)
Monocytes Absolute: 0.8 10*3/uL (ref 0.1–1.0)
Monocytes Relative: 9 %
Neutro Abs: 5.9 10*3/uL (ref 1.7–7.7)
Neutrophils Relative %: 67 %
Platelets: 273 10*3/uL (ref 150–400)
RBC: 3.41 MIL/uL — ABNORMAL LOW (ref 3.87–5.11)
RDW: 13.2 % (ref 11.5–15.5)
WBC: 8.9 10*3/uL (ref 4.0–10.5)
nRBC: 0 % (ref 0.0–0.2)

## 2019-09-13 NOTE — Progress Notes (Signed)
Occupational Therapy Session Note  Patient Details  Name: Haley Robinson MRN: [id number] Date of Birth: [birth year]  Today's Date: 09/13/2019 OT Individual Time: 1515-1600 OT Individual Time Calculation (min): 45 min    Short Term Goals: Week 2:  OT Short Term Goal 1 (Week 2): Patient will thread 1 LE through LB clothing in circle sitting with use of compensatory technique in prep for completion of LB BADLs. OT Short Term Goal 2 (Week 2): Patient will demonstrate squat-pivot transfers with Max A. OT Short Term Goal 3 (Week 2): Pt will don pull over shirt with min A OT Short Term Goal 4 (Week 2): Pt perform toileting tasks with max A  Skilled Therapeutic Interventions/Progress Updates:    1;1. Pt received in TIS agreeable to OT. Pt completes lateral scoot transfers throghotu session TIS<>EOB/EOM with MOD A and +2 steadying equipment/placing board with VC for head hips relationships. Pt completes sitting balance activities with sit ups from foam wedges using elbows to press up to sitting upright on EOM with MOD A, lateral leans onto forearm for oblique strengthening with MIN A and able to hold static balance 1 min with S. Pt completes seated beach ball volley with 1# dowel rod (hands cobaned to bar) 4x1 min with CGA to work on core strengthening and trunk control seated EOM. Pt incontinent of bladder and returned to bed rolling with A to advance pants of hips and position bed pan. RN aware of positioning as OT exits room, call light in reach and all needs met. Daughter present as well.   Therapy Documentation Precautions:  Precautions Precautions: Fall, Cervical Precaution Booklet Issued: Yes (comment) Precaution Comments: reviewed precautions Required Braces or Orthoses: Cervical Brace Cervical Brace: Soft collar (when OOB) Restrictions Weight Bearing Restrictions: No General:   Vital Signs:  Pain:   ADL: ADL Upper Body Bathing: Maximal assistance Where Assessed-Upper Body  Bathing: Bed level Lower Body Bathing: Dependent Where Assessed-Lower Body Bathing: Bed level Upper Body Dressing: Maximal assistance Where Assessed-Upper Body Dressing: Bed level Lower Body Dressing: Dependent Where Assessed-Lower Body Dressing: Bed level Toileting: Dependent Where Assessed-Toileting: Bed level Toilet Transfer: Unable to assess Tub/Shower Transfer: Unable to assess ADL Comments: Max A grossly for UB BADLs and Total A grossly for LB BADLs at bed level in supported long/circle sitting. Vision   Perception    Praxis   Exercises:   Other Treatments:     Therapy/Group: Individual Therapy  Tonny Branch 09/13/2019, 12:07 PM

## 2019-09-13 NOTE — Progress Notes (Signed)
Physical Therapy Session Note  Patient Details  Name: Haley Robinson MRN: [id number] Date of Birth: 1940-07-19  Today's Date: 09/13/2019 PT Individual Time: 1125-1200 and 1345-1430 PT Individual Time Calculation (min): 35 min missed 10 mins 2/2 nursing care at start of session time and 45 mins  Short Term Goals: Week 1:  PT Short Term Goal 1 (Week 1): Pt will perform rolling with min A consistently PT Short Term Goal 1 - Progress (Week 1): Met PT Short Term Goal 2 (Week 1): Pt will perform bed mobility with assist x 1 consistently PT Short Term Goal 2 - Progress (Week 1): Progressing toward goal PT Short Term Goal 3 (Week 1): Pt will maintain static sitting balance x 5 min with mod A PT Short Term Goal 3 - Progress (Week 1): Met Week 2:  PT Short Term Goal 1 (Week 2): Pt will perform least restricive transfer with assist x 1 consistently PT Short Term Goal 2 (Week 2): Pt will perform w/c mobility x 50 ft with LRAD PT Short Term Goal 3 (Week 2): Pt will perform bed mobility with assist x 1 consistently  Skilled Therapeutic Interventions/Progress Updates:    SESSION 1: pt received in bed with nursing care as pt reported having to urinate. Pt received on bed pan in bed, after giving pt some time to attempt to urinate however pt unable to void despite time. Pt agreeable to therapy and therapy assisted in pericare; Pt directed in x5 rolling in R and L directions mod A to R and max A to L with need of LLE management and trunk management. Pt able to maintain sidelying for hygiene with use of bed rails for support and min A. PT donned pants and breif at total assist for time management. Pt directed in supine>sit max A for BLE management and trunk management. CGA for static sitting balance at EOB, min A for trunk lean to R for slide board placement and max A for slide board transfer to TIS WC. Max A for positioning in WC and improved trunk and hip alignment. Pt taken to gym at total A in [facility] for time  management. Pt directed in x10 trunk leans to R and L direction with pt instructed to attempt to hold lean for increased time for increased I with unloading hips for improved skin integrity, min A to return to midline from R side lean and max A initially to return to midline from L but improved to min A with improved RUE technique and placement. Pt returned to room in TIS [facility], total assist for time management. Pt left in WC, alarm belt set, All needs in reach and in good condition. Call light in hand.    Session 2: pt received in TIS [facility] with daughter present and agreeable to therapy. Pt taken to gym in [facility] total A for time management. Pt directed into standing frame with max A for placement of sling in seated position and x4 standing reps with use of frame to achieve position and min A for trunk and posture righting with pt able to remain in standing for 3-4 mins each rep before rest breaks taken. Pt benefited from manual facilitation for weight shifting/bearing on RLE, hip alignment to R, and for shoulder positioning intermittently, pt did demonstrate ability to self correct shoulders and overall posture intermittently but grossly min A. Pt denied any shortness of breath, dizziness, or adverse effects with standing. Sling lowered from standing slightly to allow pt opportunity to attempt hip  extension and improved activation for standing however pt unable to achieve this at this time and required sling placement in full standing assist to complete upright stance of BLE, pt able to remain upright and midline with BUE support and core activation. Once returned to sitting, max A to position in midline hip alignment for sitting and returned to room in TIS WC, left in [facility] with daughter present, alarm belt set, All needs in reach and in good condition. Call light in hand.  Daughter had questions about hospital bed use at home and [facility] use, pt and family updated on current PT recommendations for hospital bed at home, given  home measurement sheet, and at this time pt has not been appropriate for [facility] use 2/2 decreased sitting balance. Daughter verbalized understanding and agreeable to sheet and reports she will help complete this.    Therapy Documentation Precautions:  Precautions Precautions: Fall, Cervical Precaution Booklet Issued: Yes (comment) Precaution Comments: reviewed precautions Required Braces or Orthoses: Cervical Brace Cervical Brace: Soft collar (when OOB) Restrictions Weight Bearing Restrictions: No   Therapy/Group: Individual Therapy  Junie Panning 09/13/2019, 2:53 PM

## 2019-09-13 NOTE — Progress Notes (Signed)
Occupational Therapy Session Note  Patient Details  Name: Haley Robinson MRN: [id number] Date of Birth: 05-Jan-1941  Today's Date: 09/13/2019 OT Individual Time: 0800-0900 OT Individual Time Calculation (min): 60 min    Short Term Goals: Week 2:  OT Short Term Goal 1 (Week 2): Patient will thread 1 LE through LB clothing in circle sitting with use of compensatory technique in prep for completion of LB BADLs. OT Short Term Goal 2 (Week 2): Patient will demonstrate squat-pivot transfers with Max A. OT Short Term Goal 3 (Week 2): Pt will don pull over shirt with min A OT Short Term Goal 4 (Week 2): Pt perform toileting tasks with max A  Skilled Therapeutic Interventions/Progress Updates:    Pt resting in bed upon arrival.  OT intervention with focus on BADL retraining at bed level and seated in w/c at sink, bed mobility, sitting balance, SB transfers, and activity tolerance to increase independence with BADLs. Pt with flat affect again this morning. Rolling R/L in bed with bed rails with min A. LB bathing with mod A.  LB dressing with max A. Supine>sit EoB with max A. SB transfer with tot A+2 (for safety) to w/c. Pt completed UB bathing with setup.  Pt requires min A for UB dressing. Grooming with setup. Pt remained in w/c with all needs within reach and belt alarm activated.   Therapy Documentation Precautions:  Precautions Precautions: Fall, Cervical Precaution Booklet Issued: Yes (comment) Precaution Comments: reviewed precautions Required Braces or Orthoses: Cervical Brace Cervical Brace: Soft collar (when OOB) Restrictions Weight Bearing Restrictions: No  Pain:  Pt states her "bottom" feels "ok";    Therapy/Group: Individual Therapy  Rich Brave 09/13/2019, 9:01 AM

## 2019-09-13 NOTE — Progress Notes (Signed)
Haley Robinson PHYSICAL MEDICINE & REHABILITATION PROGRESS NOTE   Subjective/Complaints:   Pt reports slept well, but still sleepy- pain doing well- controlled.  Has breakfast at bedside- hasn't eaten yet.  Laying supine.   ROS:   Pt denies SOB, abd pain, CP, N/V/C/D, and vision changes  Objective:   No results found. Recent Labs    09/13/19 0650  WBC 8.9  HGB 10.7*  HCT 33.3*  PLT 273   Recent Labs    09/13/19 0650  NA 141  K 3.5  CL 101  CO2 30  GLUCOSE 107*  BUN 17  CREATININE 0.76  CALCIUM 8.9    Intake/Output Summary (Last 24 hours) at 09/13/2019 0826 Last data filed at 09/12/2019 1831 Gross per 24 hour  Intake 440 ml  Output 500 ml  Net -60 ml     Physical Exam: Vital Signs Blood pressure (!) 106/47, pulse 62, temperature 97.9 F (36.6 C), resp. rate 18, height 5\' 5"  (1.651 m), weight 63.6 kg, SpO2 94 %. Gen.: No acute distress-laying supine in bed- asleep initially- woke easily, NAD Mood and affect are appropriate Heart RRR Lungs CTA B/L- no W/R/R- good air movement Abdomen Soft, NT, ND, (+)BS  Extremities no clubbing cyanosis or edema Skin: 2-2.5 cm diameter Stage II pressure ulcer- draining yellow clearish drainage- covered with dressing/foam-no significant change  Neurologic: Tone mild hyperreflexia right lower extremity no clonus at the ankles bilaterally no evidence of flexor withdrawal Motor strength 4+ bilateral deltoid bicep 4 - bilateral tricep trace finger flexors bilaterally trace finger extensors bilaterally 0 hand intrinsics bilaterally Right lower extremity to minus hip extension 0 quadriceps 0 ankle dorsiflexion plantarflexion. Can wiggle toes.  Left lower extremity 5/5 in the hip flexor knee extensor ankle dorsiflexor and plantar flexor unchanged Musculoskeletal: No pain with active assisted as well as passive range of motion in the upper lower limb no joint swelling Sensation intact throughout  Assessment/Plan: 1. Functional  deficits secondary to tetraparesis secondary to cervical myelopathy which require 3+ hours per day of interdisciplinary therapy in a comprehensive inpatient rehab setting.  Physiatrist is providing close team supervision and 24 hour management of active medical problems listed below.  Physiatrist and rehab team continue to assess barriers to discharge/monitor patient progress toward functional and medical goals  Care Tool:  Bathing    Body parts bathed by patient: Right arm, Left arm, Chest, Abdomen, Front perineal area, Right upper leg, Left upper leg, Face   Body parts bathed by helper: Buttocks, Right lower leg, Left lower leg     Bathing assist Assist Level: Moderate Assistance - Patient 50 - 74%     Upper Body Dressing/Undressing Upper body dressing   What is the patient wearing?: Pull over shirt    Upper body assist Assist Level: Minimal Assistance - Patient > 75%    Lower Body Dressing/Undressing Lower body dressing      What is the patient wearing?: Incontinence brief, Pants     Lower body assist Assist for lower body dressing: Maximal Assistance - Patient 25 - 49%     Toileting Toileting    Toileting assist Assist for toileting: Maximal Assistance - Patient 25 - 49%     Transfers Chair/bed transfer  Transfers assist     Chair/bed transfer assist level: 2 Helpers     Locomotion Ambulation   Ambulation assist   Ambulation activity did not occur: Safety/medical concerns          Walk 10 feet activity  Assist  Walk 10 feet activity did not occur: Safety/medical concerns        Walk 50 feet activity   Assist Walk 50 feet with 2 turns activity did not occur: Safety/medical concerns         Walk 150 feet activity   Assist Walk 150 feet activity did not occur: Safety/medical concerns         Walk 10 feet on uneven surface  activity   Assist Walk 10 feet on uneven surfaces activity did not occur: Safety/medical  concerns         Wheelchair     Assist Will patient use wheelchair at discharge?: Yes Type of Wheelchair:  (TBD pending progress)    Wheelchair assist level: Dependent - Patient 0% Max wheelchair distance: 150'    Wheelchair 50 feet with 2 turns activity    Assist        Assist Level: Dependent - Patient 0%   Wheelchair 150 feet activity     Assist      Assist Level: Dependent - Patient 0%   Blood pressure (!) 106/47, pulse 62, temperature 97.9 F (36.6 C), resp. rate 18, height 5\' 5"  (1.651 m), weight 63.6 kg, SpO2 94 %.  Medical Problem List and Plan: 1.   Tetraparesis secondary to cervical myelopathy -patient may  shower -ELOS/Goals: 20 to 24 days  -Continue CIR 2. Antithrombotics: -DVT/anticoagulation:Mechanical:Sequential compression devices, below kneeBilateral lower extremities. Bilateral lower extremity DVTs, age indeterminate, on . Started on Eliquis.   8/9- needs at least 3 months -antiplatelet therapy: N/A 3. Pain Management:Well controlled with prn Tylenol 4. Mood:LCSW to follow for evaluation and support. -antipsychotic agents: N/A 5. Neuropsych: This patient is capable of making decisions on her own behalf. 6. Skin/Wound Care:Monitor wound for healing. Will add protein supplement and multivitamin to promote healing.  8/10- has Stage II pressure ulcer- 2-2.5 cm diameter stage II pressure ulcer- will add turn q2 hours- also informed nurse if up in w/c, has to do pressure relief q30 minutes  8/11- laid pt down in bed to get off ulcer- helped pain as well- reinforced being turned regularly.   8/12- reminded pt to lay down on side if possible when not eating, doing therapy 7. Fluids/Electrolytes/Nutrition:Monitor I/O. Check lytes in am.  8. HTN: Monitor BP tid--continue HCTZ daily.Check BMET/K+ level in am. 9. Post op labs: Pre-op K+ low normal.   8/9- K+ 3.6 still  8/12- K+ 3.5-  low normal- will recheck Monday 10 Neurogenic bladder: Has been incontinent with external catheter--will monitor voiding with PVRs/bladder scan. UA negative. Continue Toviaz 4mg  daily  8/9- U/A was (-) 8/3- same time also had a elevated WBC. Will monitor/WBC  8/11- will check labs tomorrow  8/12- Leukocytosis has resolved 11.Neurogenic bowel: KUB shows large stool burden and no obstruction. Had BM with enema on admission. On daily Miralax, colace TID, Senna HS. Mag citrate x1 on 8/6, bisacodyl 10mg  supp qpm   8/10- had 2 large BMs with bowel program?- not documented  8/11- documented 2 BMs with bowel program- family ed also done.   8/12 good BM- loose- last night-  12. Insomnia: Trazodone 100mg  did not provide benefit Try Amitriptyline 25mg .  13. Leukocytosis: WBC decreased to 12.4. UA/UC negative.   8/9- WBC 12.8- basically stable- con't to monitor 2x/week.   8/12- resolved 14. Bradycardia  8/9- only BP med HCTZ- so is baseline for pt- will monitor  8/11- running 58-60- is likely due to SCI- is common- asymptomatic.  LOS: 10 days A FACE TO FACE EVALUATION WAS PERFORMED  Haley Robinson 09/13/2019, 8:26 AM

## 2019-09-13 NOTE — Plan of Care (Signed)
Discharge due to LOS and LRT absence

## 2019-09-13 NOTE — Progress Notes (Signed)
Recreational Therapy Discharge Summary Patient Details  Name: Haley Robinson MRN: [id number] Date of Birth: [birth year] Today's Date: 09/13/2019  Comments on progress toward goals: TR goal discharged as pts discharge date is scheduled for 9/1 and LRT will be unavailable until that date.  Education was provided on activity analysis identifying potential modification and relaxation strategies.  AmeLie Hollars 09/13/2019, 3:16 PM

## 2019-09-14 ENCOUNTER — Inpatient Hospital Stay (HOSPITAL_COMMUNITY): Payer: Medicare PPO | Admitting: *Deleted

## 2019-09-14 ENCOUNTER — Inpatient Hospital Stay (HOSPITAL_COMMUNITY): Payer: Medicare PPO | Admitting: Physical Therapy

## 2019-09-14 NOTE — Progress Notes (Signed)
Occupational Therapy Session Note  Patient Details  Name: Haley Robinson MRN: [id number] Date of Birth: 1940-07-29  Today's Date: 09/14/2019 OT Individual Time: 3536-1443 OT Individual Time Calculation (min): 70 min    Short Term Goals: Week 2:  OT Short Term Goal 1 (Week 2): Patient will thread 1 LE through LB clothing in circle sitting with use of compensatory technique in prep for completion of LB BADLs. OT Short Term Goal 2 (Week 2): Patient will demonstrate squat-pivot transfers with Max A. OT Short Term Goal 3 (Week 2): Pt will don pull over shirt with min A OT Short Term Goal 4 (Week 2): Pt perform toileting tasks with max A  Skilled Therapeutic Interventions/Progress Updates:    Pt eating breakfast in bed upon arrival.  Self feeding with setup. Pt agreeable to therapy. OT intervention with focus on bed mobility, LB bathing/dressing at bed level, UB bathing/dressing seated in w/c, sittng balance, SB tranfsers, and activity tolerance to increase indepependence with BADLs. Rolling R/L with min A using bed rails. LB dressing with mod A. LB dressing with max A. Supine>sit EOB with max A. SB transfer with tot A+2 (safety). Pt completed UB bathing with supervisoin and dressing with min A.  All grooming tasks with setup. Pt requires more then a reasonable amount of time to complete tasks. Pt remained in TIS w/c with all needs wiithin reach and belt alarm activated.   Therapy Documentation Precautions:  Precautions Precautions: Fall, Cervical Precaution Booklet Issued: Yes (comment) Precaution Comments: reviewed precautions Required Braces or Orthoses: Cervical Brace Cervical Brace: Soft collar (when OOB) Restrictions Weight Bearing Restrictions: No  Pain:  Pt stated that her "bottom didn't feel too bad" this morning; repositioned   Therapy/Group: Individual Therapy  Rich Brave 09/14/2019, 9:27 AM

## 2019-09-14 NOTE — Progress Notes (Signed)
Physical Therapy Session Note  Patient Details  Name: Haley Robinson MRN: [id number] Date of Birth: 10-05-1940  Today's Date: 09/14/2019 PT Individual Time: 1100-1202 and 1300-1400  PT Individual Time Calculation (min): 62 min and 60 mins  Short Term Goals: Week 1:  PT Short Term Goal 1 (Week 1): Pt will perform rolling with min A consistently PT Short Term Goal 1 - Progress (Week 1): Met PT Short Term Goal 2 (Week 1): Pt will perform bed mobility with assist x 1 consistently PT Short Term Goal 2 - Progress (Week 1): Progressing toward goal PT Short Term Goal 3 (Week 1): Pt will maintain static sitting balance x 5 min with mod A PT Short Term Goal 3 - Progress (Week 1): Met Week 2:  PT Short Term Goal 1 (Week 2): Pt will perform least restricive transfer with assist x 1 consistently PT Short Term Goal 2 (Week 2): Pt will perform w/c mobility x 50 ft with LRAD PT Short Term Goal 3 (Week 2): Pt will perform bed mobility with assist x 1 consistently  Skilled Therapeutic Interventions/Progress Updates:    SESSION 1: pt received in TIS [facility] and agreeable to therapy. Pt agreeable to trying power WC during treatment this date and one brought to room. Pt directed in transfer to EOB from TIS at max A x1 for squat pivot to bed, max A x1 squat pivot to power WC from EOB. Pt educated on functions of power chair, controls, and start/stop. Pt utilized goal post styled joystick for navigation, slowest speeds placed on the controls, seatbelt in place for pt safety. Pt directed in power chair navigation at mod A initially for turns, small areas and narrow straight paths for 300', taken outside in elevator for outdoor mobility in powerchair additional 65' min A for navigation however pt required x2 stops to reposition RUE and hand placement VC throughout for assistance and technique. Pt directed in same distance and path to return to room after several minutes of rest break outside, where pt had several questions  regarding powerchair use, mobility, space at home, all questions answered and pt also educated that she has a choice in her chair use and she agreed to speak with family. Pt returned to room with min A for navigation in smaller areas and max A for turns into/out of elevator and room. Pt left in power chair, turned off, with tray in front of pt for lunch, alarm belt set, All needs in reach and in good condition. Call light in hand. .   SESSION 2: pt received in power chair and agreeable to therapy. Pt directed in additional power chair training 150' to gym min A with pt controling power chair. Control device switched from R to L side of chair for ease of pt use with pt reporting she felt her L hand was stronger and more easily accessed on this side for button pressing. Pt demonstrated improved mobility with midline trunk posture with device on L side with pt not having to reach cross body to press buttons. Pt directed in 250'x2 In hallways with power chair mobility at min A in straight paths and turns with larger areas and mod A for returning to room. Daughter present when returned to room, she had questions about power chair use as well, all of her questions answered and she encouraged pt to be as I as possible and was supportive of any device at this time. Pt educated on pressure relief in power chair, timer set  for 30 mins to remind pt to tilt self for pressure relief for 2 mins ever hour. Pt agreeable and daughter verbalize understanding as well. Pt left in room, in powerchair, daughter present, All needs in reach and in good condition. Call light in hand and alarm belt set.   Therapy Documentation Precautions:  Precautions Precautions: Fall, Cervical Precaution Booklet Issued: Yes (comment) Precaution Comments: reviewed precautions Required Braces or Orthoses: Cervical Brace Cervical Brace: Soft collar (when OOB) Restrictions Weight Bearing Restrictions: No    Therapy/Group: Individual  Therapy  Junie Panning 09/14/2019, 3:37 PM

## 2019-09-14 NOTE — Progress Notes (Signed)
[place] PHYSICAL MEDICINE & REHABILITATION PROGRESS NOTE   Subjective/Complaints:   Pt reports buttock pain is not quite as bad this AM- feeling OK otherwise.    ROS:   Pt denies SOB, abd pain, CP, N/V/C/D, and vision changes   Objective:   No results found. Recent Labs    09/13/19 0650  WBC 8.9  HGB 10.7*  HCT 33.3*  PLT 273   Recent Labs    09/13/19 0650  NA 141  K 3.5  CL 101  CO2 30  GLUCOSE 107*  BUN 17  CREATININE 0.76  CALCIUM 8.9    Intake/Output Summary (Last 24 hours) at 09/14/2019 0830 Last data filed at 09/13/2019 1805 Gross per 24 hour  Intake 100 ml  Output 100 ml  Net 0 ml     Physical Exam: Vital Signs Blood pressure (!) 119/49, pulse 61, temperature 98.8 F (37.1 C), resp. rate 18, height 5\' 5"  (1.651 m), weight 63.6 kg, SpO2 97 %. Gen.: No acute distress-laying sitting up slightly- weight on sacrum again; appropriate, NAD Mood and affect are appropriate Heart RRR Lungs CTA B/L- no W/R/R- good air movement Abdomen Soft, NT, ND, (+)BS   Extremities no clubbing cyanosis or edema Skin: 2-2.5 cm diameter Stage II pressure ulcer- draining yellow clearish drainage- covered with dressing/foam-no significant change- didn't assess today Neurologic: Tone mild hyperreflexia right lower extremity no clonus at the ankles bilaterally no evidence of flexor withdrawal Motor strength 4+ bilateral deltoid bicep 4 - bilateral tricep trace finger flexors bilaterally trace finger extensors bilaterally 0 hand intrinsics bilaterally Right lower extremity to minus hip extension 0 quadriceps 0 ankle dorsiflexion plantarflexion. Can wiggle toes.  Left lower extremity 5/5 in the hip flexor knee extensor ankle dorsiflexor and plantar flexor unchanged Musculoskeletal: No pain with active assisted as well as passive range of motion in the upper lower limb no joint swelling Sensation intact throughout  Assessment/Plan: 1. Functional deficits secondary to  tetraparesis secondary to cervical myelopathy which require 3+ hours per day of interdisciplinary therapy in a comprehensive inpatient rehab setting.  Physiatrist is providing close team supervision and 24 hour management of active medical problems listed below.  Physiatrist and rehab team continue to assess barriers to discharge/monitor patient progress toward functional and medical goals  Care Tool:  Bathing    Body parts bathed by patient: Right arm, Left arm, Chest, Abdomen, Front perineal area, Right upper leg, Left upper leg, Face   Body parts bathed by helper: Buttocks, Right lower leg, Left lower leg     Bathing assist Assist Level: Moderate Assistance - Patient 50 - 74%     Upper Body Dressing/Undressing Upper body dressing   What is the patient wearing?: Pull over shirt    Upper body assist Assist Level: Minimal Assistance - Patient > 75%    Lower Body Dressing/Undressing Lower body dressing      What is the patient wearing?: Incontinence brief, Pants     Lower body assist Assist for lower body dressing: Maximal Assistance - Patient 25 - 49%     Toileting Toileting    Toileting assist Assist for toileting: Maximal Assistance - Patient 25 - 49%     Transfers Chair/bed transfer  Transfers assist     Chair/bed transfer assist level: 2 Helpers     Locomotion Ambulation   Ambulation assist   Ambulation activity did not occur: Safety/medical concerns          Walk 10 feet activity   Assist  Walk 10 feet activity did not occur: Safety/medical concerns        Walk 50 feet activity   Assist Walk 50 feet with 2 turns activity did not occur: Safety/medical concerns         Walk 150 feet activity   Assist Walk 150 feet activity did not occur: Safety/medical concerns         Walk 10 feet on uneven surface  activity   Assist Walk 10 feet on uneven surfaces activity did not occur: Safety/medical concerns          Wheelchair     Assist Will patient use wheelchair at discharge?: Yes Type of Wheelchair:  (TBD pending progress)    Wheelchair assist level: Dependent - Patient 0% Max wheelchair distance: 150'    Wheelchair 50 feet with 2 turns activity    Assist        Assist Level: Dependent - Patient 0%   Wheelchair 150 feet activity     Assist      Assist Level: Dependent - Patient 0%   Blood pressure (!) 119/49, pulse 61, temperature 98.8 F (37.1 C), resp. rate 18, height 5\' 5"  (1.651 m), weight 63.6 kg, SpO2 97 %.  Medical Problem List and Plan: 1.   Tetraparesis secondary to cervical myelopathy -patient may  shower -ELOS/Goals: 20 to 24 days  -Continue CIR 2. Antithrombotics: -DVT/anticoagulation:Mechanical:Sequential compression devices, below kneeBilateral lower extremities. Bilateral lower extremity DVTs, age indeterminate, on . Started on Eliquis.   8/9- needs at least 3 months -antiplatelet therapy: N/A 3. Pain Management:Well controlled with prn Tylenol 4. Mood:LCSW to follow for evaluation and support. -antipsychotic agents: N/A 5. Neuropsych: This patient is capable of making decisions on her own behalf. 6. Skin/Wound Care:Monitor wound for healing. Will add protein supplement and multivitamin to promote healing.  8/10- has Stage II pressure ulcer- 2-2.5 cm diameter stage II pressure ulcer- will add turn q2 hours- also informed nurse if up in w/c, has to do pressure relief q30 minutes  8/13- pt sitting up on ulcer- reminded her to get off ulcer- and helped her move bed.  7. Fluids/Electrolytes/Nutrition:Monitor I/O. Check lytes in am.  8. HTN: Monitor BP tid--continue HCTZ daily.Check BMET/K+ level in am. 9. Post op labs: Pre-op K+ low normal.   8/9- K+ 3.6 still  8/12- K+ 3.5- low normal- will recheck Monday 10 Neurogenic bladder: Has been incontinent with external catheter--will monitor  voiding with PVRs/bladder scan. UA negative. Continue Toviaz 4mg  daily  8/9- U/A was (-) 8/3- same time also had a elevated WBC. Will monitor/WBC  8/11- will check labs tomorrow  8/12- Leukocytosis has resolved 11.Neurogenic bowel: KUB shows large stool burden and no obstruction. Had BM with enema on admission. On daily Miralax, colace TID, Senna HS. Mag citrate x1 on 8/6, bisacodyl 10mg  supp qpm   8/13- no documentation for bowel program- pt said went well 12. Insomnia: Trazodone 100mg  did not provide benefit Try Amitriptyline 25mg .  13. Leukocytosis: WBC decreased to 12.4. UA/UC negative.   8/9- WBC 12.8- basically stable- con't to monitor 2x/week.   8/12- resolved 14. Bradycardia  8/9- only BP med HCTZ- so is baseline for pt- will monitor  8/11- running 58-60- is likely due to SCI- is common- asymptomatic.        LOS: 11 days A FACE TO FACE EVALUATION WAS PERFORMED  Shanora Christensen 09/14/2019, 8:30 AM

## 2019-09-15 ENCOUNTER — Encounter (HOSPITAL_COMMUNITY): Payer: Medicare PPO | Admitting: Occupational Therapy

## 2019-09-15 NOTE — Progress Notes (Signed)
Patient refused dulcolax supp today claims she had a bowel movement yesterday. Further claims my butt is raw and I don't want that this time.

## 2019-09-15 NOTE — Plan of Care (Signed)
  Problem: SCI BOWEL ELIMINATION Goal: RH STG MANAGE BOWEL WITH ASSISTANCE Description: STG Manage Bowel with mod Assistance. Outcome: Not Progressing;bowel program   Problem: SCI BLADDER ELIMINATION Goal: RH STG MANAGE BLADDER WITH ASSISTANCE Description: STG Manage Bladder With mod Assistance Outcome: Not Progressing;bladder scan

## 2019-09-15 NOTE — Progress Notes (Signed)
[place] PHYSICAL MEDICINE & REHABILITATION PROGRESS NOTE   Subjective/Complaints: Disappointed by her lack of ability to care for herself, but has noted improvements with therapy.  Denies pain, constipation.  Sleeping better  ROS:   Pt denies SOB, abd pain, CP, N/V/C/D, and vision changes   Objective:   No results found. Recent Labs    09/13/19 0650  WBC 8.9  HGB 10.7*  HCT 33.3*  PLT 273   Recent Labs    09/13/19 0650  NA 141  K 3.5  CL 101  CO2 30  GLUCOSE 107*  BUN 17  CREATININE 0.76  CALCIUM 8.9    Intake/Output Summary (Last 24 hours) at 09/15/2019 1112 Last data filed at 09/15/2019 0900 Gross per 24 hour  Intake 478 ml  Output --  Net 478 ml     Physical Exam: Vital Signs Blood pressure (!) 111/34, pulse (!) 51, temperature 97.6 F (36.4 C), resp. rate 18, height 5\' 5"  (1.651 m), weight 63.6 kg, SpO2 98 %. General: Alert and oriented x 3, No apparent distress HEENT: Head is normocephalic, atraumatic, PERRLA, EOMI, sclera anicteric, oral mucosa pink and moist, dentition intact, ext ear canals clear,  Neck: Supple without JVD or lymphadenopathy Heart: Reg rate and rhythm. No murmurs rubs or gallops Chest: CTA bilaterally without wheezes, rales, or rhonchi; no distress Abdomen: Soft, non-tender, non-distended, bowel sounds positive. Extremities: No clubbing, cyanosis, or edema. Pulses are 2+ Skin: 2-2.5 cm diameter Stage II pressure ulcer- draining yellow clearish drainage- covered with dressing/foam-no significant change- didn't assess today Neurologic: Tone mild hyperreflexia right lower extremity no clonus at the ankles bilaterally no evidence of flexor withdrawal Motor strength 4+ bilateral deltoid bicep 4 - bilateral tricep trace finger flexors bilaterally trace finger extensors bilaterally 0 hand intrinsics bilaterally Right lower extremity to minus hip extension 0 quadriceps 0 ankle dorsiflexion plantarflexion. Can wiggle toes.  Left lower  extremity 5/5 in the hip flexor knee extensor ankle dorsiflexor and plantar flexor unchanged Musculoskeletal: No pain with active assisted as well as passive range of motion in the upper lower limb no joint swelling Sensation intact throughout   Assessment/Plan: 1. Functional deficits secondary to tetraparesis secondary to cervical myelopathy which require 3+ hours per day of interdisciplinary therapy in a comprehensive inpatient rehab setting.  Physiatrist is providing close team supervision and 24 hour management of active medical problems listed below.  Physiatrist and rehab team continue to assess barriers to discharge/monitor patient progress toward functional and medical goals  Care Tool:  Bathing    Body parts bathed by patient: Right arm, Left arm, Chest, Abdomen, Front perineal area, Right upper leg, Left upper leg, Right lower leg, Face   Body parts bathed by helper: Buttocks, Left lower leg     Bathing assist Assist Level: Minimal Assistance - Patient > 75% (bed level and seated in w/c)     Upper Body Dressing/Undressing Upper body dressing   What is the patient wearing?: Pull over shirt    Upper body assist Assist Level: Minimal Assistance - Patient > 75%    Lower Body Dressing/Undressing Lower body dressing      What is the patient wearing?: Incontinence brief, Pants     Lower body assist Assist for lower body dressing: Maximal Assistance - Patient 25 - 49%     Toileting Toileting    Toileting assist Assist for toileting: Maximal Assistance - Patient 25 - 49%     Transfers Chair/bed transfer  Transfers assist  Chair/bed transfer assist level: 2 Helpers     Locomotion Ambulation   Ambulation assist   Ambulation activity did not occur: Safety/medical concerns          Walk 10 feet activity   Assist  Walk 10 feet activity did not occur: Safety/medical concerns        Walk 50 feet activity   Assist Walk 50 feet with 2 turns  activity did not occur: Safety/medical concerns         Walk 150 feet activity   Assist Walk 150 feet activity did not occur: Safety/medical concerns         Walk 10 feet on uneven surface  activity   Assist Walk 10 feet on uneven surfaces activity did not occur: Safety/medical concerns         Wheelchair     Assist Will patient use wheelchair at discharge?: Yes Type of Wheelchair:  (TBD pending progress)    Wheelchair assist level: Dependent - Patient 0% Max wheelchair distance: 150'    Wheelchair 50 feet with 2 turns activity    Assist        Assist Level: Dependent - Patient 0%   Wheelchair 150 feet activity     Assist      Assist Level: Dependent - Patient 0%   Blood pressure (!) 111/34, pulse (!) 51, temperature 97.6 F (36.4 C), resp. rate 18, height 5\' 5"  (1.651 m), weight 63.6 kg, SpO2 98 %.  Medical Problem List and Plan: 1.   Tetraparesis secondary to cervical myelopathy -patient may  shower -ELOS/Goals: 20 to 24 days  -Continue CIR 2. Antithrombotics: -DVT/anticoagulation:Mechanical:Sequential compression devices, below kneeBilateral lower extremities. Bilateral lower extremity DVTs, age indeterminate, on . Started on Eliquis.   8/9- needs at least 3 months -antiplatelet therapy: N/A 3. Pain Management:Well controlled with prn Tylenol 4. Mood:LCSW to follow for evaluation and support. -antipsychotic agents: N/A 5. Neuropsych: This patient is capable of making decisions on her own behalf. 6. Skin/Wound Care:Monitor wound for healing. Will add protein supplement and multivitamin to promote healing.  8/10- has Stage II pressure ulcer- 2-2.5 cm diameter stage II pressure ulcer- will add turn q2 hours- also informed nurse if up in w/c, has to do pressure relief q30 minutes  8/13- pt sitting up on ulcer- reminded her to get off ulcer- and helped her move bed.  7.  Fluids/Electrolytes/Nutrition:Monitor I/O. Check lytes in am.  8. HTN: Monitor BP tid--continue HCTZ daily.Check BMET/K+ level in am.  8/14: Diastolic is very low- 34. Stop HCTZ 9. Post op labs: Pre-op K+ low normal.   8/9- K+ 3.6 still  8/12- K+ 3.5- low normal- will recheck Monday 10 Neurogenic bladder: Has been incontinent with external catheter--will monitor voiding with PVRs/bladder scan. UA negative. Continue Toviaz 4mg  daily  8/9- U/A was (-) 8/3- same time also had a elevated WBC. Will monitor/WBC  8/11- will check labs tomorrow  8/12- Leukocytosis has resolved  8/14: Continues to have urgency, no dysuria. Has sensation of voiding.  11.Neurogenic bowel: KUB shows large stool burden and no obstruction. Had BM with enema on admission. On daily Miralax, colace TID, Senna HS. Mag citrate x1 on 8/6, bisacodyl 10mg  supp qpm   8/13- no documentation for bowel program- pt said went well   8/14: Has been receiving bowel program.  12. Insomnia: Trazodone 100mg  did not provide benefit Try Amitriptyline 25mg .  13. Leukocytosis: WBC decreased to 12.4. UA/UC negative.   8/9- WBC 12.8- basically stable- con't to  monitor 2x/week.   8/12- resolved 14. Bradycardia  8/9- only BP med HCTZ- so is baseline for pt- will monitor  8/11- running 58-60- is likely due to SCI- is common- asymptomatic.        LOS: 12 days A FACE TO FACE EVALUATION WAS PERFORMED  Haley Robinson P Haley Robinson 09/15/2019, 11:12 AM

## 2019-09-15 NOTE — Progress Notes (Signed)
Occupational Therapy Session Note  Patient Details  Name: Haley Robinson MRN: [id number] Date of Birth: 06/21/1940  Today's Date: 09/15/2019 OT Group Time: 1100-1200 OT Group Time Calculation (min): 60 min  Skilled Therapeutic Interventions/Progress Updates:    Pt engaged in therapeutic w/c level dance group focusing on patient choice, UE/LE strengthening, salience, activity tolerance, and social participation. Pt was guided through various dance-based exercises involving UEs/LEs and trunk. All music was selected by group members. Emphasis placed on NMR of UEs and Lt LE while adhering to cervical precautions. Pt very participative in group today, following guided exercises and also creating some of her own, interacting with other group members as well. She initiated incorporation of pressure relief by pressing hands against the armrests of TIS and moving trunk Lt>Rt. TIS was slightly reclined during tx. Pt was escorted back to room by RT at end of session.   Therapy Documentation Precautions:  Precautions Precautions: Fall, Cervical Precaution Booklet Issued: Yes (comment) Precaution Comments: reviewed precautions Required Braces or Orthoses: Cervical Brace Cervical Brace: Soft collar (when OOB) Restrictions Weight Bearing Restrictions: No Pain: no s/s pain during tx   ADL: ADL Upper Body Bathing: Maximal assistance Where Assessed-Upper Body Bathing: Bed level Lower Body Bathing: Dependent Where Assessed-Lower Body Bathing: Bed level Upper Body Dressing: Maximal assistance Where Assessed-Upper Body Dressing: Bed level Lower Body Dressing: Dependent Where Assessed-Lower Body Dressing: Bed level Toileting: Dependent Where Assessed-Toileting: Bed level Toilet Transfer: Unable to assess Tub/Shower Transfer: Unable to assess ADL Comments: Max A grossly for UB BADLs and Total A grossly for LB BADLs at bed level in supported long/circle sitting.     Therapy/Group: Group  Therapy  Zackarey Holleman A Carrson Lightcap 09/15/2019, 12:30 PM

## 2019-09-16 ENCOUNTER — Inpatient Hospital Stay (HOSPITAL_COMMUNITY): Payer: Medicare PPO

## 2019-09-16 ENCOUNTER — Inpatient Hospital Stay (HOSPITAL_COMMUNITY): Payer: Medicare PPO | Admitting: Occupational Therapy

## 2019-09-16 NOTE — Progress Notes (Signed)
Occupational Therapy Session Note  Patient Details  Name: Haley Robinson MRN: [id number] Date of Birth: Jun 08, 1940  Today's Date: 09/16/2019 OT Individual Time: 7564-3329 OT Individual Time Calculation (min): 47 min   Skilled Therapeutic Interventions/Progress Updates:    Pt greeted in the TIS, daughter at bedside. Pt motivated to participate in tx with ADL needs met and no c/o pain. Brought her via w/c to the dayroom. Worked on unsupported sitting balance and UE NMR by engaging in Wii Just Dance with TIS in neutral position. Pt required CGA-Mod A for dynamic balance. She dropped the controller x2 and needed assistance to grip it again. Transitioned to more intrinsic hand strengthening via [place]. Pt with practice able to press 2 buttons, swing arm, and then release buttons using her Lt UE while maintaining grip on controller. At end of tx pt was escorted back to room, tilted back in TIS for comfort and pressure relief. Reiterated to daughter need for change in tilt position every 30 minutes. Left pt with daughter, all needs within reach and safety belt fastened.     Therapy Documentation Precautions:  Precautions Precautions: Fall, Cervical Precaution Booklet Issued: Yes (comment) Precaution Comments: reviewed precautions Required Braces or Orthoses: Cervical Brace Cervical Brace: Soft collar (when OOB) Restrictions Weight Bearing Restrictions: No ADL: ADL Upper Body Bathing: Maximal assistance Where Assessed-Upper Body Bathing: Bed level Lower Body Bathing: Dependent Where Assessed-Lower Body Bathing: Bed level Upper Body Dressing: Maximal assistance Where Assessed-Upper Body Dressing: Bed level Lower Body Dressing: Dependent Where Assessed-Lower Body Dressing: Bed level Toileting: Dependent Where Assessed-Toileting: Bed level Toilet Transfer: Unable to assess Tub/Shower Transfer: Unable to assess ADL Comments: Max A grossly for UB BADLs and Total A grossly for LB BADLs at  bed level in supported long/circle sitting.      Therapy/Group: Individual Therapy  Anisha Starliper A Roann Merk 09/16/2019, 4:23 PM

## 2019-09-16 NOTE — Progress Notes (Signed)
Bowel program done ; no bm at this time.

## 2019-09-16 NOTE — Plan of Care (Signed)
  Problem: SCI BOWEL ELIMINATION Goal: RH STG MANAGE BOWEL WITH ASSISTANCE Description: STG Manage Bowel with mod Assistance. Outcome: Not Progressing; bowel program   Problem: SCI BLADDER ELIMINATION Goal: RH STG MANAGE BLADDER WITH ASSISTANCE Description: STG Manage Bladder With mod Assistance Outcome: Not Progressing; bladder scan; incontinence   Problem: RH SKIN INTEGRITY Goal: RH STG SKIN FREE OF INFECTION/BREAKDOWN Description: Pt will be free of skin breakdown/infection prior to DC with mod/max assist Outcome: Not Progressing; stage 2 coccyx area- foam dressing applied

## 2019-09-16 NOTE — Progress Notes (Signed)
[facility] PHYSICAL MEDICINE & REHABILITATION PROGRESS NOTE   Subjective/Complaints: Refused Dulcolax suppository last night as she felt her butt is raw. Has been getting prior nights. Slept poorly last night but had been sleeping well prior nights- would like to maintain current dose of Amitriptyline.   ROS:   Pt denies SOB, abd pain, CP, N/V/C/D, and vision changes   Objective:   No results found. No results for input(s): WBC, HGB, HCT, PLT in the last 72 hours. No results for input(s): NA, K, CL, CO2, GLUCOSE, BUN, CREATININE, CALCIUM in the last 72 hours.  Intake/Output Summary (Last 24 hours) at 09/16/2019 0947 Last data filed at 09/15/2019 2109 Gross per 24 hour  Intake 240 ml  Output --  Net 240 ml     Physical Exam: Vital Signs Blood pressure (!) 120/50, pulse (!) 53, temperature 98.1 F (36.7 C), temperature source Oral, resp. rate 16, height 5\' 5"  (1.651 m), weight 63.6 kg, SpO2 97 %. General: Alert and oriented x 3, No apparent distress HEENT: Head is normocephalic, atraumatic, PERRLA, EOMI, sclera anicteric, oral mucosa pink and moist, dentition intact, ext ear canals clear,  Neck: Supple without JVD or lymphadenopathy Heart: Reg rate and rhythm. No murmurs rubs or gallops Chest: CTA bilaterally without wheezes, rales, or rhonchi; no distress Abdomen: Soft, non-tender, non-distended, bowel sounds positive. Extremities: No clubbing, cyanosis, or edema. Pulses are 2+ Skin: 2-2.5 cm diameter Stage II pressure ulcer- draining yellow clearish drainage- covered with dressing/foam-no significant change- didn't assess today Neurologic: Tone mild hyperreflexia right lower extremity no clonus at the ankles bilaterally no evidence of flexor withdrawal Motor strength 4+ bilateral deltoid bicep 4 - bilateral tricep trace finger flexors bilaterally trace finger extensors bilaterally 0 hand intrinsics bilaterally Right lower extremity to minus hip extension 0 quadriceps 0 ankle  dorsiflexion plantarflexion. Can wiggle toes.  Left lower extremity 5/5 in the hip flexor knee extensor ankle dorsiflexor and plantar flexor unchanged Musculoskeletal: No pain with active assisted as well as passive range of motion in the upper lower limb no joint swelling Sensation intact throughout    Assessment/Plan: 1. Functional deficits secondary to tetraparesis secondary to cervical myelopathy which require 3+ hours per day of interdisciplinary therapy in a comprehensive inpatient rehab setting.  Physiatrist is providing close team supervision and 24 hour management of active medical problems listed below.  Physiatrist and rehab team continue to assess barriers to discharge/monitor patient progress toward functional and medical goals  Care Tool:  Bathing    Body parts bathed by patient: Right arm, Left arm, Chest, Abdomen, Front perineal area, Right upper leg, Left upper leg, Right lower leg, Face   Body parts bathed by helper: Buttocks, Left lower leg     Bathing assist Assist Level: Minimal Assistance - Patient > 75% (bed level and seated in w/c)     Upper Body Dressing/Undressing Upper body dressing   What is the patient wearing?: Pull over shirt    Upper body assist Assist Level: Minimal Assistance - Patient > 75%    Lower Body Dressing/Undressing Lower body dressing      What is the patient wearing?: Incontinence brief, Pants     Lower body assist Assist for lower body dressing: Maximal Assistance - Patient 25 - 49%     Toileting Toileting    Toileting assist Assist for toileting: Maximal Assistance - Patient 25 - 49%     Transfers Chair/bed transfer  Transfers assist     Chair/bed transfer assist level: 2 Helpers  Locomotion Ambulation   Ambulation assist   Ambulation activity did not occur: Safety/medical concerns          Walk 10 feet activity   Assist  Walk 10 feet activity did not occur: Safety/medical concerns         Walk 50 feet activity   Assist Walk 50 feet with 2 turns activity did not occur: Safety/medical concerns         Walk 150 feet activity   Assist Walk 150 feet activity did not occur: Safety/medical concerns         Walk 10 feet on uneven surface  activity   Assist Walk 10 feet on uneven surfaces activity did not occur: Safety/medical concerns         Wheelchair     Assist Will patient use wheelchair at discharge?: Yes Type of Wheelchair:  (TBD pending progress)    Wheelchair assist level: Dependent - Patient 0% Max wheelchair distance: 150'    Wheelchair 50 feet with 2 turns activity    Assist        Assist Level: Dependent - Patient 0%   Wheelchair 150 feet activity     Assist      Assist Level: Dependent - Patient 0%   Blood pressure (!) 120/50, pulse (!) 53, temperature 98.1 F (36.7 C), temperature source Oral, resp. rate 16, height 5\' 5"  (1.651 m), weight 63.6 kg, SpO2 97 %.  Medical Problem List and Plan: 1.   Tetraparesis secondary to cervical myelopathy -patient may  shower -ELOS/Goals: 20 to 24 days  -Continue CIR 2. Antithrombotics: -DVT/anticoagulation:Mechanical:Sequential compression devices, below kneeBilateral lower extremities. Bilateral lower extremity DVTs, age indeterminate, on . Started on Eliquis.   8/9- needs at least 3 months -antiplatelet therapy: N/A 3. Pain Management:Well controlled with prn Tylenol 4. Mood:LCSW to follow for evaluation and support. -antipsychotic agents: N/A 5. Neuropsych: This patient is capable of making decisions on her own behalf. 6. Skin/Wound Care:Monitor wound for healing. Will add protein supplement and multivitamin to promote healing.  8/10- has Stage II pressure ulcer- 2-2.5 cm diameter stage II pressure ulcer- will add turn q2 hours- also informed nurse if up in w/c, has to do pressure relief q30 minutes  8/13- pt  sitting up on ulcer- reminded her to get off ulcer- and helped her move bed.  7. Fluids/Electrolytes/Nutrition:Monitor I/O. Check lytes in am.  8. HTN: Monitor BP tid--continue HCTZ daily.Check BMET/K+ level in am.  8/14: Diastolic is very low- 34. Stop HCTZ  8/15: Diastolic and HR continue to be low- continue to monitor for effects from yesterday's change.  9. Post op labs: Pre-op K+ low normal.   8/9- K+ 3.6 still  8/12- K+ 3.5- low normal- will recheck Monday 10 Neurogenic bladder: Has been incontinent with external catheter--will monitor voiding with PVRs/bladder scan. UA negative. Continue Toviaz 4mg  daily  8/9- U/A was (-) 8/3- same time also had a elevated WBC. Will monitor/WBC  8/11- will check labs tomorrow  8/12- Leukocytosis has resolved  8/14: Continues to have urgency, no dysuria. Has sensation of voiding.  11.Neurogenic bowel: KUB shows large stool burden and no obstruction. Had BM with enema on admission. On daily Miralax, colace TID, Senna HS. Mag citrate x1 on 8/6, bisacodyl 10mg  supp qpm   8/13- no documentation for bowel program- pt said went well   8/14: Has been receiving bowel program.    8/15: refused bowel program last night.  12. Insomnia: Trazodone 100mg  did not provide benefit Try  Amitriptyline 25mg .   8/15: Has been helping but she slept poorly last night.  13. Leukocytosis: WBC decreased to 12.4. UA/UC negative.   8/9- WBC 12.8- basically stable- con't to monitor 2x/week.   8/12- resolved 14. Bradycardia  8/9- only BP med HCTZ- so is baseline for pt- will monitor  8/11- running 58-60- is likely due to SCI- is common- asymptomatic.        LOS: 13 days A FACE TO FACE EVALUATION WAS PERFORMED  10/11 Morna Flud 09/16/2019, 9:47 AM

## 2019-09-16 NOTE — Progress Notes (Signed)
Occupational Therapy Session Note  Patient Details  Name: LAQUASHA GROOME MRN: [id number] Date of Birth: 1940/02/05  Today's Date: 09/16/2019 OT Individual Time: 1015-1100 OT Individual Time Calculation (min): 45 min    Short Term Goals: Week 2:  OT Short Term Goal 1 (Week 2): Patient will thread 1 LE through LB clothing in circle sitting with use of compensatory technique in prep for completion of LB BADLs. OT Short Term Goal 2 (Week 2): Patient will demonstrate squat-pivot transfers with Max A. OT Short Term Goal 3 (Week 2): Pt will don pull over shirt with min A OT Short Term Goal 4 (Week 2): Pt perform toileting tasks with max A  Skilled Therapeutic Interventions/Progress Updates:    Pt resting in bed upon arrival. OT intervention with focus on bed mobility, sitting balance, SB transfers, BADL retraining, and activity tolerance to increase independence with BADLs. LB bathing/dressing at bed level with pt rolling R/L with min A using bed rails. UB bathing/dressing seated in w/c at sink. See Care Tool for assist levels. Pt requires max A for supine>sit EOB but demonstrating improved ability to return to upright sitting position from lateral leans and sidelying. SB transfer with tot A+2 for safety with SB. Pt remained seated in w/c with all needs within reach and belt alarm activated.   Therapy Documentation Precautions:  Precautions Precautions: Fall, Cervical Precaution Booklet Issued: Yes (comment) Precaution Comments: reviewed precautions Required Braces or Orthoses: Cervical Brace Cervical Brace: Soft collar (when OOB) Restrictions Weight Bearing Restrictions: No  Pain:  Pt stated her "bottom felt raw"; repositioned   Therapy/Group: Individual Therapy  Rich Brave 09/16/2019, 12:02 PM

## 2019-09-17 ENCOUNTER — Inpatient Hospital Stay (HOSPITAL_COMMUNITY): Payer: Medicare PPO | Admitting: Physical Therapy

## 2019-09-17 ENCOUNTER — Encounter (HOSPITAL_COMMUNITY): Payer: Medicare PPO | Admitting: Psychology

## 2019-09-17 ENCOUNTER — Inpatient Hospital Stay (HOSPITAL_COMMUNITY): Payer: Medicare PPO | Admitting: Occupational Therapy

## 2019-09-17 LAB — BASIC METABOLIC PANEL
Anion gap: 9 (ref 5–15)
BUN: 16 mg/dL (ref 8–23)
CO2: 29 mmol/L (ref 22–32)
Calcium: 8.8 mg/dL — ABNORMAL LOW (ref 8.9–10.3)
Chloride: 101 mmol/L (ref 98–111)
Creatinine, Ser: 0.8 mg/dL (ref 0.44–1.00)
GFR calc Af Amer: >60 mL/min (ref >60)
GFR calc non Af Amer: >60 mL/min (ref >60)
Glucose, Bld: 117 mg/dL — ABNORMAL HIGH (ref 70–99)
Potassium: 3.5 mmol/L (ref 3.5–5.1)
Sodium: 139 mmol/L (ref 135–145)

## 2019-09-17 LAB — CBC
HCT: 32.1 % — ABNORMAL LOW (ref 36.0–46.0)
Hemoglobin: 10.3 g/dL — ABNORMAL LOW (ref 12.0–15.0)
MCH: 31.1 pg (ref 26.0–34.0)
MCHC: 32.1 g/dL (ref 30.0–36.0)
MCV: 97 fL (ref 80.0–100.0)
Platelets: 268 10*3/uL (ref 150–400)
RBC: 3.31 MIL/uL — ABNORMAL LOW (ref 3.87–5.11)
RDW: 12.9 % (ref 11.5–15.5)
WBC: 6.7 10*3/uL (ref 4.0–10.5)
nRBC: 0 % (ref 0.0–0.2)

## 2019-09-17 NOTE — Progress Notes (Signed)
Upon entering room with meds, advised pt that suppository would be brought in after supper for bowel program. Pt advised that she had a large BM overnight and that she would like a break from suppository tonight. Educated on importance but continued to refuse.

## 2019-09-17 NOTE — Progress Notes (Signed)
[facility] PHYSICAL MEDICINE & REHABILITATION PROGRESS NOTE   Subjective/Complaints:  Pt reports she's nauseated- had good BM last night.   Thinks due to milk from breakfast- is laying on R side in bed- not on sacrum. Hasn't taken pain meds since last night.       ROS:   Pt denies SOB, abd pain, CP, N/V/C/D, and vision changes   Objective:   No results found. Recent Labs    09/17/19 0449  WBC 6.7  HGB 10.3*  HCT 32.1*  PLT 268   Recent Labs    09/17/19 0449  NA 139  K 3.5  CL 101  CO2 29  GLUCOSE 117*  BUN 16  CREATININE 0.80  CALCIUM 8.8*    Intake/Output Summary (Last 24 hours) at 09/17/2019 0838 Last data filed at 09/16/2019 2044 Gross per 24 hour  Intake 480 ml  Output --  Net 480 ml     Physical Exam: Vital Signs Blood pressure 110/67, pulse 66, temperature 97.7 F (36.5 C), temperature source Oral, resp. rate 18, height 5\' 5"  (1.651 m), weight 63.6 kg, SpO2 97 %. General: alert, appropriate, NAD HEENT: conjugate gaze Neck: Supple without JVD or lymphadenopathy Heart: RRR Chest: CTA B/L- no W/R/R- good air movement Abdomen: Soft, NT, ND, (+)BS  Extremities: No clubbing, cyanosis, or edema. Pulses are 2+ Skin: < 2 cm- open- oval shaped- just the top layer gone, but very superficial - between buttocks- in crease- Stage II- not anywhere near Stage III Neurologic: Tone mild hyperreflexia right lower extremity no clonus at the ankles bilaterally no evidence of flexor withdrawal Motor strength 4+ bilateral deltoid bicep 4 - bilateral tricep trace finger flexors bilaterally trace finger extensors bilaterally 0 hand intrinsics bilaterally RLE- 0/5 Musculoskeletal: No pain with active assisted as well as passive range of motion in the upper lower limb no joint swelling Sensation intact throughout    Assessment/Plan: 1. Functional deficits secondary to tetraparesis secondary to cervical myelopathy which require 3+ hours per day of interdisciplinary  therapy in a comprehensive inpatient rehab setting.  Physiatrist is providing close team supervision and 24 hour management of active medical problems listed below.  Physiatrist and rehab team continue to assess barriers to discharge/monitor patient progress toward functional and medical goals  Care Tool:  Bathing    Body parts bathed by patient: Right arm, Left arm, Chest, Abdomen, Front perineal area, Right upper leg, Left upper leg, Right lower leg, Face   Body parts bathed by helper: Buttocks, Left lower leg     Bathing assist Assist Level: Minimal Assistance - Patient > 75% (LB at bed level)     Upper Body Dressing/Undressing Upper body dressing   What is the patient wearing?: Pull over shirt    Upper body assist Assist Level: Supervision/Verbal cueing    Lower Body Dressing/Undressing Lower body dressing      What is the patient wearing?: Incontinence brief, Pants     Lower body assist Assist for lower body dressing: Maximal Assistance - Patient 25 - 49% (bed level)     Toileting Toileting    Toileting assist Assist for toileting: Maximal Assistance - Patient 25 - 49%     Transfers Chair/bed transfer  Transfers assist     Chair/bed transfer assist level: 2 Helpers     Locomotion Ambulation   Ambulation assist   Ambulation activity did not occur: Safety/medical concerns          Walk 10 feet activity   Assist  Walk  10 feet activity did not occur: Safety/medical concerns        Walk 50 feet activity   Assist Walk 50 feet with 2 turns activity did not occur: Safety/medical concerns         Walk 150 feet activity   Assist Walk 150 feet activity did not occur: Safety/medical concerns         Walk 10 feet on uneven surface  activity   Assist Walk 10 feet on uneven surfaces activity did not occur: Safety/medical concerns         Wheelchair     Assist Will patient use wheelchair at discharge?: Yes Type of  Wheelchair:  (TBD pending progress)    Wheelchair assist level: Dependent - Patient 0% Max wheelchair distance: 150'    Wheelchair 50 feet with 2 turns activity    Assist        Assist Level: Dependent - Patient 0%   Wheelchair 150 feet activity     Assist      Assist Level: Dependent - Patient 0%   Blood pressure 110/67, pulse 66, temperature 97.7 F (36.5 C), temperature source Oral, resp. rate 18, height 5\' 5"  (1.651 m), weight 63.6 kg, SpO2 97 %.  Medical Problem List and Plan: 1.   Tetraparesis secondary to cervical myelopathy -patient may  shower -ELOS/Goals: 20 to 24 days  -Continue CIR 2. Antithrombotics: -DVT/anticoagulation:Mechanical:Sequential compression devices, below kneeBilateral lower extremities. Bilateral lower extremity DVTs, age indeterminate, on . Started on Eliquis.   8/9- needs at least 3 months -antiplatelet therapy: N/A 3. Pain Management:Well controlled with prn Tylenol 4. Mood:LCSW to follow for evaluation and support. -antipsychotic agents: N/A 5. Neuropsych: This patient is capable of making decisions on her own behalf. 6. Skin/Wound Care:Monitor wound for healing. Will add protein supplement and multivitamin to promote healing.  8/10- has Stage II pressure ulcer- 2-2.5 cm diameter stage II pressure ulcer- will add turn q2 hours- also informed nurse if up in w/c, has to do pressure relief q30 minutes  8/13- pt sitting up on ulcer- reminded her to get off ulcer- and helped her move bed.   8/16- pt's ulcer actually looks good- less drainage than last week. Nursing asking for WOC, but not sure that's needed- she just needs to get off sacrum when in bed- stay turned to side unless eating/therapy.  7. Fluids/Electrolytes/Nutrition:Monitor I/O. Check lytes in am.  8. HTN: Monitor BP tid--continue HCTZ daily.Check BMET/K+ level in am.  8/14: Diastolic is very low- 34. Stop  HCTZ  8/15: Diastolic and HR continue to be low- continue to monitor for effects from yesterday's change.   8/16- BP doing better off HCTZ  9. Post op labs: Pre-op K+ low normal.   8/9- K+ 3.6 still  8/12- K+ 3.5- low normal- will recheck Monday 10 Neurogenic bladder: Has been incontinent with external catheter--will monitor voiding with PVRs/bladder scan. UA negative. Continue Toviaz 4mg  daily  8/9- U/A was (-) 8/3- same time also had a elevated WBC. Will monitor/WBC  8/11- will check labs tomorrow  8/12- Leukocytosis has resolved  8/14: Continues to have urgency, no dysuria. Has sensation of voiding.  11.Neurogenic bowel: KUB shows large stool burden and no obstruction. Had BM with enema on admission. On daily Miralax, colace TID, Senna HS. Mag citrate x1 on 8/6, bisacodyl 10mg  supp qpm   8/13- no documentation for bowel program- pt said went well   8/14: Has been receiving bowel program.    8/15: refused bowel program last  night.   8/16- did bowel program/dig stim only last night 12. Insomnia: Trazodone 100mg  did not provide benefit Try Amitriptyline 25mg .   8/15: Has been helping but she slept poorly last night.  13. Leukocytosis: WBC decreased to 12.4. UA/UC negative.   8/9- WBC 12.8- basically stable- con't to monitor 2x/week.   8/12- resolved 14. Bradycardia  8/9- only BP med HCTZ- so is baseline for pt- will monitor  8/11- running 58-60- is likely due to SCI- is common- asymptomatic.  15. Nausea  8/16- give prns meds     LOS: 14 days A FACE TO FACE EVALUATION WAS PERFORMED  Muhamed Luecke 09/17/2019, 8:38 AM

## 2019-09-17 NOTE — Progress Notes (Signed)
Occupational Therapy Weekly Progress Note  Patient Details  Name: Haley Robinson MRN: [id number] Date of Birth: 07/30/1940  Beginning of progress report period: September 10, 2019 End of progress report period: September 17, 2019  Patient has met 3 of 4 short term goals.  Pt is making slow but steady progress towards LTGs. Pt currently completes LB bathing/dressing tasks at bed level before transferring to w/c for UB bathing/dressing tasks. Pt is able to thread her LLE into pants when sitting in circle sitting with HOB elevated. Pt requires max A for supine>sit EOB and min A for static sitting balance in preparation for SB transfers to w/c for UB bathing/dressing tasks. Pt is supervision for UB bathing and min A for UB dressing tasks.  Pt is setup/supervision for grooming tasks seated in w/c at sink. Pt currently requires max A for dynamic sitting balance at EOM/EOB. Pt is incontinent of bowel and bladder and has not performed BSC transfers.   Patient continues to demonstrate the following deficits: muscle weakness and muscle paralysis, decreased cardiorespiratoy endurance, unbalanced muscle activation and decreased coordination and decreased sitting balance, decreased postural control and decreased balance strategies and therefore will continue to benefit from skilled OT intervention to enhance overall performance with BADL.  Patient progressing toward long term goals..  Continue plan of care.  OT Short Term Goals Week 2:  OT Short Term Goal 1 (Week 2): Patient will thread 1 LE through LB clothing in circle sitting with use of compensatory technique in prep for completion of LB BADLs. OT Short Term Goal 1 - Progress (Week 2): Met OT Short Term Goal 2 (Week 2): Patient will demonstrate squat-pivot transfers with Max A. OT Short Term Goal 2 - Progress (Week 2): Progressing toward goal OT Short Term Goal 3 (Week 2): Pt will don pull over shirt with min A OT Short Term Goal 3 - Progress (Week 2): Met OT  Short Term Goal 4 (Week 2): Pt perform toileting tasks with max A OT Short Term Goal 4 - Progress (Week 2): Met Week 3:  OT Short Term Goal 1 (Week 3): Patient will demonstrate squat-pivot transfers with Max A. OT Short Term Goal 2 (Week 3): Pt will maintain dynamic sitting balance EOB/EOM with mod A during functional activities OT Short Term Goal 3 (Week 3): Pt will complete toilting tasks with mod A         Leroy Libman 09/17/2019, 6:37 AM

## 2019-09-17 NOTE — Progress Notes (Signed)
Patient had loose bowel movement in bed. RN offered to do digital stimulation and patient agreed. She had more large, loose stool out. Pericare done.

## 2019-09-17 NOTE — Consult Note (Signed)
Neuropsychological Consultation   Patient:   Haley Robinson   DOB:   1940-12-06  MR Number:  [id number]  Location:  [facility] [address] CENTER [address] [id number] [place] [address] Dept: [phone] Loc: [phone]           Date of Service:   09/17/2019  Start Time:   10 AM End Time:   11 AM  Provider/Observer:  Arley Phenix, Psy.D.       Clinical Neuropsychologist       Billing Code/Service: [id number]  Chief Complaint:    Haley Robinson is a 79 year old female with history of hypertension, anxiety associated with MRI procedures, numbness and weakness of the hand since 2000 felt to be due to cervical spinal stenosis.  1 month prior to admission, she started having progressive symptoms with gait instability and spasticity left greater than right side.  Work-up revealed severe cervical stenosis with myelopathy C4/5 to C6/7.  Patient admitted on 08/28/2019 for ACDF C4-C7.  Postoperatively, the patient had weakness bilaterally of lower extremities with spasms and right lower extremity and sensory deficits around C7.  Steroids were added for symptoms which were gradually improve for a few weeks.  Therapy ongoing and patient limited by quadriplegia and working on pregait activity/standing trials.  Patient has been admitted to the CIR due to functional decline.  Reason for Service:  Haley Robinson is a 79 year old female with history of HTN, numbness and weakness in hands since 2000 felt to be due to cervical spinal stenosis. 1 month prior to admission, she started having progressive symptoms with gait instability and spasticity left greater than right side. Work-up done revealing severe cervical stenosis with myelopathy C4/5 to C6/7. She was admitted on 08/28/2019 for ACDF C4-C7 by Dr. Dutch Quint. Postop has had weakness BLE with spasms RLE and sensory deficits around C7. Neurosurgery added steroids for postop symptoms  which would gradually improve a few weeks.Therapy ongoing and patient limited by quadriplegia and working on pregait activity/standing trials. CIR recommended due to functional decline.   Current Status:  Patient reports that she does have some coping issues around functional loss and is concerned about regaining her motor function particularly for her right leg.  Her right leg and right arm are most problematic for her but she does have weakness overall.  The patient reports that sensation is improving to some degree and she is getting more movement in her leg but continues to be unable to ambulate.  Patient reports that she is not dealing with significant anxiety and that her history of anxiety was specifically around claustrophobia and being very stressed when having to do MRIs.  Her previous use of benzodiazepine was strictly around MRI procedures.  Behavioral Observation: Haley Robinson  presents as a 79 y.o.-year-old Right Caucasian Female who appeared her stated age. her dress was Appropriate and she was Well Groomed and her manners were Appropriate to the situation.  her participation was indicative of Appropriate and Attentive behaviors.  There were physical disabilities noted.  she displayed an appropriate level of cooperation and motivation.     Interactions:    Active Appropriate and Attentive  Attention:   within normal limits and attention span and concentration were age appropriate  Memory:   within normal limits; recent and remote memory intact  Visuo-spatial:  not examined  Speech (Volume):  normal  Speech:   normal; normal  Thought Process:  Coherent and Relevant  Though  Content:  WNL; not suicidal and not homicidal  Orientation:   person, place, time/date and situation  Judgment:   Good  Planning:   Good  Affect:    Appropriate  Mood:    Dysphoric  Insight:   Good  Intelligence:   normal  Medical History:   Past Medical History:  Diagnosis Date  .  Anxiety    w/ procedures  . Carotid bruit   . Hyperlipidemia   . Hypertension   . Numbness and tingling in both hands   . Osteoarthritis    hands  . Pneumonia    x 1 - over 30 yrs ago  . PONV (postoperative nausea and vomiting)    Nausea with hysterectomy surgery only, no problems with other procedures/surgeries  . Spinal stenosis   . Vertigo, benign positional    x 4 years  . Vitamin D deficiency    Psychiatric History:  Patient has no prior psychiatric history.  The patient does have some claustrophobia which is exacerbated when she has procedure such as an MRI.  The patient was given as needed Xanax for MRI procedures but does not take this medication in other situations.  Family Med/Psych History:  Family History  Problem Relation Age of Onset  . Alzheimer's disease Mother   . Lung cancer Father     Impression/DX:  Haley Robinson is a 79 year old female with history of hypertension, anxiety associated with MRI procedures, numbness and weakness of the hand since 2000 felt to be due to cervical spinal stenosis.  1 month prior to admission, she started having progressive symptoms with gait instability and spasticity left greater than right side.  Work-up revealed severe cervical stenosis with myelopathy C4/5 to C6/7.  Patient admitted on 08/28/2019 for ACDF C4-C7.  Postoperatively, the patient had weakness bilaterally of lower extremities with spasms and right lower extremity and sensory deficits around C7.  Steroids were added for symptoms which were gradually improve for a few weeks.  Therapy ongoing and patient limited by quadriplegia and working on pregait activity/standing trials.  Patient has been admitted to the CIR due to functional decline.  Patient reports that she does have some coping issues around functional loss and is concerned about regaining her motor function particularly for her right leg.  Her right leg and right arm are most problematic for her but she does have  weakness overall.  The patient reports that sensation is improving to some degree and she is getting more movement in her leg but continues to be unable to ambulate.  Patient reports that she is not dealing with significant anxiety and that her history of anxiety was specifically around claustrophobia and being very stressed when having to do MRIs.  Her previous use of benzodiazepine was strictly around MRI procedures.  Disposition/Plan:  Today we worked on coping and adjustment issues around extended hospital stay and significant loss of motor function and sensation changes following surgery for severe cervical spinal stenosis.  Diagnosis:   Cervical spinal stenosis with myelopathy and loss of motor function and sensory changes.        Electronically Signed   _______________________ Arley Phenix, Psy.D.

## 2019-09-17 NOTE — Progress Notes (Signed)
Physical Therapy Session Note  Patient Details  Name: Haley Robinson MRN: [id number] Date of Birth: 08-21-1940  Today's Date: 09/17/2019 PT Individual Time: 1100-1200 PT Individual Time Calculation (min): 60 min   Short Term Goals: Week 2:  PT Short Term Goal 1 (Week 2): Pt will perform least restricive transfer with assist x 1 consistently PT Short Term Goal 2 (Week 2): Pt will perform w/c mobility x 50 ft with LRAD PT Short Term Goal 3 (Week 2): Pt will perform bed mobility with assist x 1 consistently  Skilled Therapeutic Interventions/Progress Updates:    Pt received sidelying in bed, agreeable to PT session. No complaints of pain. Sidelying to sitting EOB with mod A for BLE management. Pt exhibits improved sitting balance and maintains static sitting balance with BUE support on bed and BLE on floor with close SBA. Slide board transfer bed to w/c with max A. Dependent transport via TIS w/c to/from therapy gym. Slide board transfer w/c to/from mat table with max A. Sitting EOM to long-sitting on mat with assist x 2 for BLE management and trunk control. Session focus on B HS stretch in long-sitting position as well as core strengthening in this position. Pt is able to perform anterior leans utilizing BUE on BLE to reach forwards for increased HS stretch x 5 reps with min A for core control. Long-sitting ball roll progressing to ball toss with use of BUE and min A required at times to maintain upright sitting. Pt exhibits improved trunk strength this date with decreased reliance on BUE to maintain upright sitting balance. Long-sitting reach task with alt UE reaching outside BOS and across midline with min A needed for trunk control. Assisted pt back to TIS w/c at end of session. Pt left semi-reclined in TIS chair with needs in reach, quick release belt and chair alarm in place at end of session.  Therapy Documentation Precautions:  Precautions Precautions: Fall, Cervical Precaution Booklet  Issued: Yes (comment) Precaution Comments: reviewed precautions Required Braces or Orthoses: Cervical Brace Cervical Brace: Soft collar (when OOB) Restrictions Weight Bearing Restrictions: No    Therapy/Group: Individual Therapy   Peter Congo, PT, DPT  09/17/2019, 12:23 PM

## 2019-09-17 NOTE — Progress Notes (Signed)
Occupational Therapy Session Note  Patient Details  Name: Haley Robinson MRN: [id number] Date of Birth: [birth year]  Today's Date: 09/17/2019 OT Individual Time: 1417-1520 OT Individual Time Calculation (min): 63 min    Short Term Goals: Week 2:  OT Short Term Goal 1 (Week 2): Patient will thread 1 LE through LB clothing in circle sitting with use of compensatory technique in prep for completion of LB BADLs. OT Short Term Goal 1 - Progress (Week 2): Met OT Short Term Goal 2 (Week 2): Patient will demonstrate squat-pivot transfers with Max A. OT Short Term Goal 2 - Progress (Week 2): Progressing toward goal OT Short Term Goal 3 (Week 2): Pt will don pull over shirt with min A OT Short Term Goal 3 - Progress (Week 2): Met OT Short Term Goal 4 (Week 2): Pt perform toileting tasks with max A OT Short Term Goal 4 - Progress (Week 2): Met  Skilled Therapeutic Interventions/Progress Updates:    Patient in bed, daughter present for therapy session.  She denies pain and agrees to participate in therapy session.  Rolling right/left in bed with min A.  Right side lying to sitting edge of bed with mod A.  She tolerated unsupported sitting with trunk activation and balance activities, reaching, pelvic mobility and lateral leans with min/mod A for 20 minutes.  SB transfer bed to TIS w/c max A of one with stand by of 2nd person.  Reviewed positioning and trunk symmetry, provided lap board to facilitate upright posture and midline with good results.  Completed bilateral UE AROM activities shoulder to hand with focus on motor control at wrist/forearm and hand dexterity.  SB transfer w/c back to bed at close of session max A.  Max A sit to supine.  NT present at end of session to assist patient to bed pan upon her request.    Therapy Documentation Precautions:  Precautions Precautions: Fall, Cervical Precaution Booklet Issued: Yes (comment) Precaution Comments: reviewed precautions Required Braces or  Orthoses: Cervical Brace Cervical Brace: Soft collar (when OOB) Restrictions Weight Bearing Restrictions: No   Therapy/Group: Individual Therapy  Carlos Levering 09/17/2019, 7:50 AM

## 2019-09-17 NOTE — Plan of Care (Signed)
  Problem: Consults Goal: RH SPINAL CORD INJURY PATIENT EDUCATION Description:  See Patient Education module for education specifics.  Outcome: Progressing Goal: Skin Care Protocol Initiated - if Braden Score 18 or less Description: If consults are not indicated, leave blank or document N/A Outcome: Progressing   Problem: SCI BOWEL ELIMINATION Goal: RH STG MANAGE BOWEL WITH ASSISTANCE Description: STG Manage Bowel with mod Assistance. Outcome: Progressing Goal: RH STG SCI MANAGE BOWEL WITH MEDICATION WITH ASSISTANCE Description: STG SCI Manage bowel with medication with min/mod assistance. Outcome: Progressing   Problem: SCI BLADDER ELIMINATION Goal: RH STG MANAGE BLADDER WITH ASSISTANCE Description: STG Manage Bladder With mod Assistance Outcome: Progressing   Problem: RH SKIN INTEGRITY Goal: RH STG SKIN FREE OF INFECTION/BREAKDOWN Description: Pt will be free of skin breakdown/infection prior to DC with mod/max assist Outcome: Progressing Goal: RH STG MAINTAIN SKIN INTEGRITY WITH ASSISTANCE Description: STG Maintain Skin Integrity With mod/max Assistance. Outcome: Progressing   Problem: RH SAFETY Goal: RH STG ADHERE TO SAFETY PRECAUTIONS W/ASSISTANCE/DEVICE Description: STG Adhere to Safety Precautions With cues and reminders Assistance/Device. Outcome: Progressing   

## 2019-09-17 NOTE — Progress Notes (Signed)
Occupational Therapy Session Note  Patient Details  Name: Haley Robinson MRN: [id number] Date of Birth: 07/23/1940  Today's Date: 09/17/2019 OT Individual Time: 0086-7619 OT Individual Time Calculation (min): 70 min    Short Term Goals: Week 3:  OT Short Term Goal 1 (Week 3): Patient will demonstrate squat-pivot transfers with Max A. OT Short Term Goal 2 (Week 3): Pt will maintain dynamic sitting balance EOB/EOM with mod A during functional activities OT Short Term Goal 3 (Week 3): Pt will complete toilting tasks with mod A  Skilled Therapeutic Interventions/Progress Updates:     Pt resting in bed upon arrival laying on her L side to relieve pressure on sacrum. OT intervention with focus on BADL retraining at bed level and seated in w/c at sink.  LB bathing/dressing at bed level and HOB elevated. Pt able to cross LLE to thread into pants but unable to cross RLE. Pt required assistance to cross LLE. Pt required assistance bathing buttocks while in sidelying. Pt required min A for supine>sit EOB with HOB elevated. Static sitting balance with min A in preparation for SB transfer to w/c.  Lateral lean to R with min A. SB transfer with tot A. Pt completed UB bathing/dressing with min A for pulling shirt over trunk. Pt completed bathing with supervision.  Grooming with setup. Pt remained in w/c with all needs within reach and belt alarm activated.   Therapy Documentation Precautions:  Precautions Precautions: Fall, Cervical Precaution Booklet Issued: Yes (comment) Precaution Comments: reviewed precautions Required Braces or Orthoses: Cervical Brace Cervical Brace: Soft collar (when OOB) Restrictions Weight Bearing Restrictions: No   Pain:  Pt commented that her "bottom is always uncomfortable." repositioned   Therapy/Group: Individual Therapy  Rich Brave 09/17/2019, 10:23 AM

## 2019-09-18 ENCOUNTER — Inpatient Hospital Stay (HOSPITAL_COMMUNITY): Payer: Medicare PPO | Admitting: *Deleted

## 2019-09-18 ENCOUNTER — Inpatient Hospital Stay (HOSPITAL_COMMUNITY): Payer: Medicare PPO | Admitting: Physical Therapy

## 2019-09-18 ENCOUNTER — Inpatient Hospital Stay (HOSPITAL_COMMUNITY): Payer: Medicare PPO

## 2019-09-18 MED ORDER — LIDOCAINE HCL URETHRAL/MUCOSAL 2 % EX GEL
1.0000 "application " | Freq: Every day | CUTANEOUS | Status: DC
  Administered 2019-09-18 – 2019-10-02 (×11): 1 via TOPICAL
  Filled 2019-09-18 (×8): qty 5

## 2019-09-18 NOTE — Progress Notes (Signed)
Physical Therapy Weekly Progress Note  Patient Details  Name: Haley Robinson MRN: [id number] Date of Birth: 11/18/1940  Beginning of progress report period: September 11, 2019 End of progress report period: September 18, 2019  Today's Date: 09/18/2019  Patient has met 2 of 3 short term goals.  Pt continues to make very slow and minimal gains towards rehab goals. Pt is mod to max A for bed mobility, max to total A for squat pivot and/or slide board transfers, and is only able to perform standing safely via use of standing frame or other lifting device. Pt has been able to initiate power wheelchair mobility and requires Supervision with mod to max cueing for safe w/c management.   Patient continues to demonstrate the following deficits muscle weakness, muscle joint tightness and muscle paralysis, decreased cardiorespiratoy endurance, abnormal tone, unbalanced muscle activation and decreased coordination and decreased sitting balance, decreased standing balance, decreased postural control and decreased balance strategies and therefore will continue to benefit from skilled PT intervention to increase functional independence with mobility.  Patient not progressing toward long term goals.  See goal revision..  Plan of care revisions: downgraded transfer goals to dependent via mechanical lift, d/c car transfer goal, upgraded w/c mobility goal.  PT Short Term Goals Week 2:  PT Short Term Goal 1 (Week 2): Pt will perform least restricive transfer with assist x 1 consistently PT Short Term Goal 1 - Progress (Week 2): Progressing toward goal PT Short Term Goal 2 (Week 2): Pt will perform w/c mobility x 50 ft with LRAD PT Short Term Goal 2 - Progress (Week 2): Met PT Short Term Goal 3 (Week 2): Pt will perform bed mobility with assist x 1 consistently PT Short Term Goal 3 - Progress (Week 2): Met Week 3:  PT Short Term Goal 1 (Week 3): Pt will demonstrate ability to perform pressure relief independently via [place]  or request assist with pressure relief when in TIS PT Short Term Goal 2 (Week 3): Pt will maintain static sitting balance with close Supervision x 5 min PT Short Term Goal 3 (Week 3): Pt will perform least restrictive transfer with assist x 1   Therapy Documentation Precautions:  Precautions Precautions: Fall, Cervical Precaution Booklet Issued: Yes (comment) Precaution Comments: reviewed precautions Required Braces or Orthoses: Cervical Brace Cervical Brace: Soft collar (when OOB) Restrictions Weight Bearing Restrictions: No   Therapy/Group: Individual Therapy   Excell Seltzer, PT, DPT  09/18/2019, 7:36 AM

## 2019-09-18 NOTE — Progress Notes (Signed)
Occupational Therapy Session Note  Patient Details  Name: Haley Robinson MRN: [id number] Date of Birth: [birth year]  Today's Date: 09/18/2019 OT Individual Time: 6222-9798 OT Individual Time Calculation (min): 55 min    Short Term Goals: Week 3:  OT Short Term Goal 1 (Week 3): Patient will demonstrate squat-pivot transfers with Max A. OT Short Term Goal 2 (Week 3): Pt will maintain dynamic sitting balance EOB/EOM with mod A during functional activities OT Short Term Goal 3 (Week 3): Pt will complete toilting tasks with mod A  Skilled Therapeutic Interventions/Progress Updates:    Pt resting in bed upon arrival and ready for therapy.  OT intervention with focus on bed mobility, sitting balance, BADL retraining, SB transfers, and activity tolerance to increase independence with BADLs. Rolling R/L in bed with min A using bed rails.  LB bathing at bed level with min A. LB dressing at bed level with max A. SB transfer with max A+2 for safety. UB bathing/dressing w/c level at sink with supervision. Pt requires BUE support for unsupported sitting balance at sink for grooming and brushing teeth. Pt remained in w/c with all needs within reach and belt alarm activated.   Therapy Documentation Precautions:  Precautions Precautions: Fall, Cervical Precaution Booklet Issued: Yes (comment) Precaution Comments: reviewed precautions Required Braces or Orthoses: Cervical Brace Cervical Brace: Soft collar (when OOB) Restrictions Weight Bearing Restrictions: No General:   Vital Signs:  Pain:   ADL: ADL Upper Body Bathing: Maximal assistance Where Assessed-Upper Body Bathing: Bed level Lower Body Bathing: Dependent Where Assessed-Lower Body Bathing: Bed level Upper Body Dressing: Maximal assistance Where Assessed-Upper Body Dressing: Bed level Lower Body Dressing: Dependent Where Assessed-Lower Body Dressing: Bed level Toileting: Dependent Where Assessed-Toileting: Bed level Toilet Transfer:  Unable to assess Tub/Shower Transfer: Unable to assess ADL Comments: Max A grossly for UB BADLs and Total A grossly for LB BADLs at bed level in supported long/circle sitting. Vision   Perception    Praxis   Exercises:   Other Treatments:     Therapy/Group: Individual Therapy  Rich Brave 09/18/2019, 8:58 AM

## 2019-09-18 NOTE — Progress Notes (Signed)
Physical Therapy Session Note  Patient Details  Name: Haley Robinson MRN: [id number] Date of Birth: 17-Dec-1940  Today's Date: 09/18/2019 PT Individual Time: 9678-9381; 1300-1330 PT Individual Time Calculation (min): 70 min and 30 min  Short Term Goals: Week 2:  PT Short Term Goal 1 (Week 2): Pt will perform least restricive transfer with assist x 1 consistently PT Short Term Goal 2 (Week 2): Pt will perform w/c mobility x 50 ft with LRAD PT Short Term Goal 3 (Week 2): Pt will perform bed mobility with assist x 1 consistently  Skilled Therapeutic Interventions/Progress Updates:    Session 1: Pt received reclined in TIS w/c in room at beginning of session, agreeable to PT session. No complaints of pain. Pt agreeable to trial power wheelchair mobility again this date. Slide board transfer TIS to bed then bed to PWC with max to total A via slide board. Reviewed PWC controls including how to drive chair and how to perform tilt/recline. Pt able to perform PWC mobility 2 x 200 ft navigating through busy hallway at Supervision level with cues for obstacle avoidance and attention to environment. Session focus on maneuvering PWC through obstacles starting with chair-height obstacles set 4 ft apart and decreasing distance to 2-3 ft apart. Pt requires max cueing progressing to mod cueing for safe management of PWC around obstacles. Reviewed center of gravity on wheelchair and when to initiate turns. Pt would benefit from ongoing practice with turns and obstacle navigation. Pt agreeable to remain seated in PWC at end of session, reviewed pressure relief schedule and pt agreeable and understanding of schedule. Pt left semi-reclined in PWC in room with needs in reach, seat belt in place at end of session.  Session 2: Pt received seated in PWC having just finished lunch. Per pt report she has not performed pressure relief since left up in power wheelchair from AM session. Reiterated importance of pressure relief  being performed every 30 min to prevent further skin breakdown. Assisted pt with perform tilt and recline while seated in PWC. Pt somewhat fearful of being in fully tilted and reclined position, provided assurance that pt was safe in this position and stood next to her to provide physical support if needed. Pt able to control tilt and recline feature on w/c at Supervision level with cues. Pt then requesting to return to bed due to discomfort from sitting up in chair. Slide board transfer back to bed with total A. Sit to supine max A for trunk control and BLE management. Pt left in R sidelying in bed with needs in reach, bed alarm in place at end of session.  Therapy Documentation Precautions:  Precautions Precautions: Fall, Cervical Precaution Booklet Issued: Yes (comment) Precaution Comments: reviewed precautions Required Braces or Orthoses: Cervical Brace Cervical Brace: Soft collar (when OOB) Restrictions Weight Bearing Restrictions: No    Therapy/Group: Individual Therapy   Peter Congo, PT, DPT  09/18/2019, 12:17 PM

## 2019-09-18 NOTE — Progress Notes (Signed)
[name] PHYSICAL MEDICINE & REHABILITATION PROGRESS NOTE   Subjective/Complaints:   Pt reports no pain as long as stays off sacrum. Didn't do bowel program last night- said nursing said she didn't have to do it since had large BM the day prior- explained to train the bowels to go at the same time daily, it's better to do daily, no matter what- if sore, ordered lidocaine for bowel program.    ROS:   Pt denies SOB, abd pain, CP, N/V/C/D, and vision changes   Objective:   No results found. Recent Labs    09/17/19 0449  WBC 6.7  HGB 10.3*  HCT 32.1*  PLT 268   Recent Labs    09/17/19 0449  NA 139  K 3.5  CL 101  CO2 29  GLUCOSE 117*  BUN 16  CREATININE 0.80  CALCIUM 8.8*    Intake/Output Summary (Last 24 hours) at 09/18/2019 0841 Last data filed at 09/17/2019 2115 Gross per 24 hour  Intake 460 ml  Output --  Net 460 ml     Physical Exam: Vital Signs Blood pressure (!) 114/45, pulse 60, temperature 98.5 F (36.9 C), temperature source Oral, resp. rate 16, height 5\' 5"  (1.651 m), weight 63.6 kg, SpO2 94 %. General: alert,laying on side in bed- off sacrum, NAD HEENT: conjugate gaze Neck: Supple without JVD or lymphadenopathy Heart: RRR Chest: CTA B/L- no W/R/R- good air movement Abdomen: Soft, NT, ND, (+)BS  Extremities: No clubbing, cyanosis, or edema. Pulses are 2+ Skin: < 2 cm- open- oval shaped- just the top layer gone, but very superficial - between buttocks- in crease- Stage II- not anywhere near Stage III- not assessed today- was seen Monday Neurologic: Tone mild hyperreflexia right lower extremity no clonus at the ankles bilaterally no evidence of flexor withdrawal Motor strength 4+ bilateral deltoid bicep 4 - bilateral tricep trace finger flexors bilaterally trace finger extensors bilaterally 0 hand intrinsics bilaterally RLE- 0/5 Musculoskeletal: No pain with active assisted as well as passive range of motion in the upper lower limb no joint  swelling Sensation intact throughout    Assessment/Plan: 1. Functional deficits secondary to tetraparesis secondary to cervical myelopathy which require 3+ hours per day of interdisciplinary therapy in a comprehensive inpatient rehab setting.  Physiatrist is providing close team supervision and 24 hour management of active medical problems listed below.  Physiatrist and rehab team continue to assess barriers to discharge/monitor patient progress toward functional and medical goals  Care Tool:  Bathing    Body parts bathed by patient: Right arm, Left arm, Chest, Abdomen, Front perineal area, Right upper leg, Left upper leg, Right lower leg, Face   Body parts bathed by helper: Buttocks, Left lower leg     Bathing assist Assist Level: Minimal Assistance - Patient > 75%     Upper Body Dressing/Undressing Upper body dressing   What is the patient wearing?: Pull over shirt    Upper body assist Assist Level: Supervision/Verbal cueing    Lower Body Dressing/Undressing Lower body dressing      What is the patient wearing?: Incontinence brief, Pants     Lower body assist Assist for lower body dressing: Maximal Assistance - Patient 25 - 49%     Toileting Toileting    Toileting assist Assist for toileting: Maximal Assistance - Patient 25 - 49%     Transfers Chair/bed transfer  Transfers assist     Chair/bed transfer assist level: Maximal Assistance - Patient 25 - 49% (slide board)  Locomotion Ambulation   Ambulation assist   Ambulation activity did not occur: Safety/medical concerns          Walk 10 feet activity   Assist  Walk 10 feet activity did not occur: Safety/medical concerns        Walk 50 feet activity   Assist Walk 50 feet with 2 turns activity did not occur: Safety/medical concerns         Walk 150 feet activity   Assist Walk 150 feet activity did not occur: Safety/medical concerns         Walk 10 feet on uneven surface   activity   Assist Walk 10 feet on uneven surfaces activity did not occur: Safety/medical concerns         Wheelchair     Assist Will patient use wheelchair at discharge?: Yes Type of Wheelchair:  (TBD pending progress)    Wheelchair assist level: Dependent - Patient 0% Max wheelchair distance: 150'    Wheelchair 50 feet with 2 turns activity    Assist        Assist Level: Dependent - Patient 0%   Wheelchair 150 feet activity     Assist      Assist Level: Dependent - Patient 0%   Blood pressure (!) 114/45, pulse 60, temperature 98.5 F (36.9 C), temperature source Oral, resp. rate 16, height 5\' 5"  (1.651 m), weight 63.6 kg, SpO2 94 %.  Medical Problem List and Plan: 1.   Tetraparesis secondary to cervical myelopathy -patient may  shower -ELOS/Goals: 20 to 24 days  -Continue CIR 2. Antithrombotics: -DVT/anticoagulation:Mechanical:Sequential compression devices, below kneeBilateral lower extremities. Bilateral lower extremity DVTs, age indeterminate, on . Started on Eliquis.   8/9- needs at least 3 months -antiplatelet therapy: N/A 3. Pain Management:Well controlled with prn Tylenol 4. Mood:LCSW to follow for evaluation and support. -antipsychotic agents: N/A 5. Neuropsych: This patient is capable of making decisions on her own behalf. 6. Skin/Wound Care:Monitor wound for healing. Will add protein supplement and multivitamin to promote healing.  8/10- has Stage II pressure ulcer- 2-2.5 cm diameter stage II pressure ulcer- will add turn q2 hours- also informed nurse if up in w/c, has to do pressure relief q30 minutes  8/13- pt sitting up on ulcer- reminded her to get off ulcer- and helped her move bed.   8/17- stay off sacrum unless eating/doing therapy using ROHO.   7. Fluids/Electrolytes/Nutrition:Monitor I/O. Check lytes in am.  8. HTN: Monitor BP tid--continue HCTZ daily.Check BMET/K+  level in am.  8/14: Diastolic is very low- 34. Stop HCTZ  8/15: Diastolic and HR continue to be low- continue to monitor for effects from yesterday's change.   8/17- BP 114/45- DBP still low- off BP meds 9. Post op labs: Pre-op K+ low normal.   8/9- K+ 3.6 still  8/12- K+ 3.5- low normal- will recheck Monday 10 Neurogenic bladder: Has been incontinent with external catheter--will monitor voiding with PVRs/bladder scan. UA negative. Continue Toviaz 4mg  daily  8/9- U/A was (-) 8/3- same time also had a elevated WBC. Will monitor/WBC  8/11- will check labs tomorrow  8/12- Leukocytosis has resolved  8/14: Continues to have urgency, no dysuria. Has sensation of voiding.  11.Neurogenic bowel: KUB shows large stool burden and no obstruction. Had BM with enema on admission. On daily Miralax, colace TID, Senna HS. Mag citrate x1 on 8/6, bisacodyl 10mg  supp qpm   8/13- no documentation for bowel program- pt said went well   8/14: Has been receiving  bowel program.    8/15: refused bowel program last night.   8/16- did bowel program/dig stim only last night  8/17- refused again last night- said nurse said she could- offered it, but said had good BM the day prior- explained that needs to do daily to do bowel habit. Added lidocaine prn for bowel program, just in case.   12. Insomnia: Trazodone 100mg  did not provide benefit Try Amitriptyline 25mg .   8/15: Has been helping but she slept poorly last night.  13. Leukocytosis: WBC decreased to 12.4. UA/UC negative.   8/9- WBC 12.8- basically stable- con't to monitor 2x/week.   8/12- resolved 14. Bradycardia  8/9- only BP med HCTZ- so is baseline for pt- will monitor  8/11- running 58-60- is likely due to SCI- is common- asymptomatic.  15. Nausea  8/16- give prns meds     LOS: 15 days A FACE TO FACE EVALUATION WAS PERFORMED  Haley Robinson 09/18/2019, 8:41 AM

## 2019-09-18 NOTE — Patient Care Conference (Signed)
Inpatient RehabilitationTeam Conference and Plan of Care Update Date: 09/18/2019   Time: 11:01 AM   Patient Name: Haley Robinson      Medical Record Number: [id number]  Date of Birth: 09/23/1940 Sex: Female         Room/Bed: 4M03C/4M03C-01 Payor Info: Payor: [employer] MEDICARE / Plan: [employer] MEDICARE CHOICE PPO / Product Type: *No Product type* /    Admit Date/Time:  09/03/2019  5:32 PM  Primary Diagnosis:  Cervical myelopathy [facility])  Hospital Problems: Principal Problem:   Cervical myelopathy (HCC) Active Problems:   Cervical arthritis with myelopathy   Pressure injury of skin    Expected Discharge Date: Expected Discharge Date: 10/03/19  Team Members Present: Physician leading conference: Dr. Genice Rouge Care Coodinator Present: Kennyth Arnold, RN, BSN, CRRN;Cecile Sheerer, LCSWA Nurse Present: Other (comment) Tinnie Gens, RN) PT Present: Peter Congo, PT OT Present: Roney Mans, OT;Ardis Rowan, COTA SLP Present: Feliberto Gottron, SLP PPS Coordinator present : Edson Snowball, Park Breed, SLP     Current Status/Progress Goal Weekly Team Focus  Bowel/Bladder   Patient is incontinent of B/B but does experience urges.  Current bowel program with bladder scan and Q6H I/O cath >350 mL.  Patient will have improved GI/GU function and regain B/B continence  Assess GI/GU function Qshift and PRN.  Reassess toileting needs Q2H with turns.  Maintain adequate hydration and skin integrity.   Swallow/Nutrition/ Hydration             ADL's   LB bathing at bed level-min A; LB dressing at bed level-max A; UB bathing at w/c level-supervision; UB dressing at w/c level-min A; SB tranfsers with max A+2 or squat pivot transfer with max AA+2, bed mobility rolling-min A; supine<>sit-max A; min A static sitting balance  min A overall (to be downgraded to mod A overall)  bed mobility, sitting balance, functional transfers, LB bathing/dressing, core strengtheinging, education   Mobility    min A rolling, mod to max A supine to/from sit, max A squat pivot vs SB transfer, Supervision PWC mobility  min A overall at w/c level, will need to be downgraded  sitting balance, transfers, w/c mobility   Communication             Safety/Cognition/ Behavioral Observations            Pain   Patient c/o occasional neck and back pain well managed with non-pharmacological interventions (turns, repositions, pillows) and PRN Tramadol.  Patient will maintain pain level less than 4 with or without increased activity tolerance  Assess pain level Qshift and PRN.  Provide pharmacological and non-pharmacological interventions.   Skin   Patient has Stage II PU to Coccyx. Q2H turns, pillow wedges, foam cleanser and barrier creams utilized.  Patient will have improved wound healing and be free of any further breakdown or infection.  Assess skin Qshift and PRN, continue Q2H turns and offloading, encourage nutrition to promote wound healing     Discharge Planning:  Pt daughter [name] and husband live with pt in the home. Both daughters- [name] and [name] will alternate their time with assisting with care.   Team Discussion: Continent/incontinent urine, Bowel program every evening to train bowels to go at same time everyday. PVR's have low volumes. Transfers max +2 assist for bed mobility with OT. Flat affect is improving. Hoyer training with family, continue family education. Possible placement to SNF. Patient on target to meet rehab goals: yes, but family may want to per sue SNF placement.   *  See Care Plan and progress notes for long and short-term goals.   Revisions to Treatment Plan:   None  Teaching Needs: Michiel Sites training and family education  Current Barriers to Discharge: Inaccessible home environment, Decreased caregiver support, Home enviroment access/layout, Neurogenic bowel and bladder, Lack of/limited family support and Weight bearing restrictions  Possible Resolutions to  Barriers: Possible SNF placement, bowel program every evening, bladder training, family education to begin this week, wheelchair seating eval scheduled.     Medical Summary Current Status: bowel program- bladder incontinence- 50% of time  Barriers to Discharge: Decreased family/caregiver support;Home enviroment access/layout;Incontinence;Neurogenic Bowel & Bladder;Weight bearing restrictions;Wound care  Barriers to Discharge Comments: sacral stage II ulcer- stable- d/c 9/1 Possible Resolutions to Barriers/Weekly Focus: bowel/bladder incontinence- about the same in therapy- doing power w/c-doesn't want power w/c though- seating eval being set up- might need hoyer   Continued Need for Acute Rehabilitation Level of Care: The patient requires daily medical management by a physician with specialized training in physical medicine and rehabilitation for the following reasons: Direction of a multidisciplinary physical rehabilitation program to maximize functional independence : Yes Medical management of patient stability for increased activity during participation in an intensive rehabilitation regime.: Yes Analysis of laboratory values and/or radiology reports with any subsequent need for medication adjustment and/or medical intervention. : Yes   I attest that I was present, lead the team conference, and concur with the assessment and plan of the team.   Tennis Must 09/18/2019, 3:11 PM

## 2019-09-18 NOTE — Progress Notes (Signed)
Occupational Therapy Session Note  Patient Details  Name: YUE FLANIGAN MRN: [id number] Date of Birth: May 13, 1940  Today's Date: 09/18/2019 OT Individual Time: 1400-1425 OT Individual Time Calculation (min): 25 min    Short Term Goals: Week 3:  OT Short Term Goal 1 (Week 3): Patient will demonstrate squat-pivot transfers with Max A. OT Short Term Goal 2 (Week 3): Pt will maintain dynamic sitting balance EOB/EOM with mod A during functional activities OT Short Term Goal 3 (Week 3): Pt will complete toilting tasks with mod A  Skilled Therapeutic Interventions/Progress Updates:    Pt resting in bed upon arrival. Pt requested to engage in bed level activities.  Focus on supine>sit, rolling R<>L using bed rails, and sitting balance. Rolling R<>L with bed rails with min A. Supine>sit EOB with max A. Static sitting balance with min A and UE support. Lateral leans to R with min A and returning to sitting upright with min A. Pt required max A for returning to sidelying in bed. Pt remained in bed with all needs within reach and bed alarm activated.   Therapy Documentation Precautions:  Precautions Precautions: Fall, Cervical Precaution Booklet Issued: Yes (comment) Precaution Comments: reviewed precautions Required Braces or Orthoses: Cervical Brace Cervical Brace: Soft collar (when OOB) Restrictions Weight Bearing Restrictions: No Pain:  Pt states that her "bottom" doesn't hurt when she is laying on side but is uncomfortable when on her back.  Pt commented that sitting in the power w/c when tilted is better then sitting in TIS w/c   Therapy/Group: Individual Therapy  Rich Brave 09/18/2019, 2:30 PM

## 2019-09-18 NOTE — Plan of Care (Signed)
  Problem: Sit to Stand Goal: LTG:  Patient will perform sit to stand with assistance level (PT) Description: LTG:  Patient will perform sit to stand with assistance level (PT) Flowsheets (Taken 09/18/2019 1618) LTG: PT will perform sit to stand in preparation for functional mobility with assistance level: (downgrade goal due to slow progress) Maximal Assistance - Patient 25 - 49% Note: Downgrade goal due to slow progress   Problem: RH Bed to Chair Transfers Goal: LTG Patient will perform bed/chair transfers w/assist (PT) Description: LTG: Patient will perform bed to chair transfers with assistance (PT). Flowsheets (Taken 09/18/2019 1618) LTG: Pt will perform Bed to Chair Transfers with assistance level: (downgrade goal due to slow progress) Dependent - mechanical lift Note: Downgrade goal due to slow progress   Problem: RH Car Transfers Goal: LTG Patient will perform car transfers with assist (PT) Description: LTG: Patient will perform car transfers with assistance (PT). Flowsheets (Taken 09/18/2019 1618) LTG: Pt will perform car transfers with assist:: (d/c goal due to slow progress) -- Note: D/c goal due to slow progress   Problem: RH Wheelchair Mobility Goal: LTG Patient will propel w/c in controlled environment (PT) Description: LTG: Patient will propel wheelchair in controlled environment, # of feet with assist (PT) Flowsheets (Taken 09/18/2019 1618) LTG: Pt will propel w/c in controlled environ  assist needed:: (upgrade goal due to progress) Independent with assistive device Goal: LTG Patient will propel w/c in home environment (PT) Description: LTG: Patient will propel wheelchair in home environment, # of feet with assistance (PT). Flowsheets (Taken 09/18/2019 1618) LTG: Pt will propel w/c in home environ  assist needed:: (upgrade goal due to progress) Independent with assistive device

## 2019-09-18 NOTE — Progress Notes (Signed)
Suppository given with lidocaine. Digital stimulation done per protocol. Pt tolerated well

## 2019-09-18 NOTE — Progress Notes (Signed)
Patient ID: Haley Robinson, female   DOB: [birth year], 79 y.o.   MRN: [id number]  SW returned phone call to pt dtr tracy [phone]) to provide updates from team conference, and d/c date remaining 9/1. She reported concerns to pt appearing to be "out of it," and "tired." States that she would like follow-up from a physician to give insight to medications she may be on, and to get professional opinion on her condition. SW discussed family edu needed. Fam edu scheduled for: 8/25 with Tracy/Heath 1pm-3:30pm; 8/26-8/27 with [name] 1pm-3:30pm; 8/28 with dtrs [name] and Allson 10am-12pm. SW informed medical team she would like follow-up on pt medical condition.   Cecile Sheerer, MSW, LCSWA Office: [phone] Cell: [phone] Fax: [phone]

## 2019-09-18 NOTE — Plan of Care (Signed)
  Problem: Consults Goal: RH SPINAL CORD INJURY PATIENT EDUCATION Description:  See Patient Education module for education specifics.  Outcome: Progressing Goal: Skin Care Protocol Initiated - if Braden Score 18 or less Description: If consults are not indicated, leave blank or document N/A Outcome: Progressing   Problem: SCI BOWEL ELIMINATION Goal: RH STG MANAGE BOWEL WITH ASSISTANCE Description: STG Manage Bowel with mod Assistance. Outcome: Progressing Goal: RH STG SCI MANAGE BOWEL WITH MEDICATION WITH ASSISTANCE Description: STG SCI Manage bowel with medication with min/mod assistance. Outcome: Progressing   Problem: SCI BLADDER ELIMINATION Goal: RH STG MANAGE BLADDER WITH ASSISTANCE Description: STG Manage Bladder With mod Assistance Outcome: Progressing   Problem: RH SKIN INTEGRITY Goal: RH STG SKIN FREE OF INFECTION/BREAKDOWN Description: Pt will be free of skin breakdown/infection prior to DC with mod/max assist Outcome: Progressing Goal: RH STG MAINTAIN SKIN INTEGRITY WITH ASSISTANCE Description: STG Maintain Skin Integrity With mod/max Assistance. Outcome: Progressing   Problem: RH SAFETY Goal: RH STG ADHERE TO SAFETY PRECAUTIONS W/ASSISTANCE/DEVICE Description: STG Adhere to Safety Precautions With cues and reminders Assistance/Device. Outcome: Progressing   

## 2019-09-19 ENCOUNTER — Inpatient Hospital Stay (HOSPITAL_COMMUNITY): Payer: Medicare PPO | Admitting: Occupational Therapy

## 2019-09-19 ENCOUNTER — Inpatient Hospital Stay (HOSPITAL_COMMUNITY): Payer: Medicare PPO | Admitting: Physical Therapy

## 2019-09-19 NOTE — Progress Notes (Signed)
Physical Therapy Session Note  Patient Details  Name: Haley Robinson MRN: [id number] Date of Birth: Aug 27, 1940  Today's Date: 09/19/2019 PT Individual Time: 1530-1630 PT Individual Time Calculation (min): 60 min   Short Term Goals: Week 3:  PT Short Term Goal 1 (Week 3): Pt will demonstrate ability to perform pressure relief independently via Short or request assist with pressure relief when in TIS PT Short Term Goal 2 (Week 3): Pt will maintain static sitting balance with close Supervision x 5 min PT Short Term Goal 3 (Week 3): Pt will perform least restrictive transfer with assist x 1  Skilled Therapeutic Interventions/Progress Updates:   Pt received supine in bed and agreeable to PT. PT noted that power WC hand controls had become disconnected from L side arm rest. PT re-attached hand controls with mild adjustments to position to improve safety and success of WC mobility. Supine>sit transfer with max assist +2 for safety. SB transfer to [facility] with total A +2 for safety and moderate cues for LE/UE position for safety. Power WC mobility training through hall 3 x 158f with supervision assist through hall and min assist to navigate doorways. PT attempted to adjust L UE arm rest to improve positioning and reduce shoulder strain with WC mobility training. Unable to make appropriate adjustments. Pt returned to room and performed SB transfer to bed with total A. Sit>supine completed with max assust and left supine in bed with call bell in reach and all needs met.    Therapy Documentation Precautions:  Precautions Precautions: Fall, Cervical Precaution Booklet Issued: Yes (comment) Precaution Comments: reviewed precautions Required Braces or Orthoses: Cervical Brace Cervical Brace: Soft collar (when OOB) Restrictions Weight Bearing Restrictions: No Pain: denies   Therapy/Group: Individual Therapy  ALorie Phenix8/18/2021, 5:02 PM

## 2019-09-19 NOTE — Progress Notes (Signed)
[place] PHYSICAL MEDICINE & REHABILITATION PROGRESS NOTE   Subjective/Complaints:   So sleepy this AM- for last 2 days- wasn't sure why- if got pain meds in the AM.   Has already gotten meds this AM- haven't changed regular meds.      ROS:   Pt denies SOB, abd pain, CP, N/V/C/D, and vision changes  Objective:   No results found. Recent Labs    09/17/19 0449  WBC 6.7  HGB 10.3*  HCT 32.1*  PLT 268   Recent Labs    09/17/19 0449  NA 139  K 3.5  CL 101  CO2 29  GLUCOSE 117*  BUN 16  CREATININE 0.80  CALCIUM 8.8*    Intake/Output Summary (Last 24 hours) at 09/19/2019 0757 Last data filed at 09/18/2019 1836 Gross per 24 hour  Intake 578 ml  Output --  Net 578 ml     Physical Exam: Vital Signs Blood pressure (!) 112/50, pulse 62, temperature 97.7 F (36.5 C), temperature source Oral, resp. rate 18, height 5\' 5"  (1.651 m), weight 63.6 kg, SpO2 95 %. General: alert, laying supine- on backside, NAD HEENT: conjugate gaze Neck: Supple without JVD or lymphadenopathy Heart: RRR Chest: CTA B/L- no W/R/R- good air movement Abdomen: Soft, NT, ND, (+)BS  Extremities: No clubbing, cyanosis, or edema. Pulses are 2+ Skin: < 2 cm- open- oval shaped- just the top layer gone, but very superficial - between buttocks- in crease- Stage II- not anywhere near Stage III- not assessed today- was seen Monday Neurologic: Tone mild hyperreflexia right lower extremity no clonus at the ankles bilaterally no evidence of flexor withdrawal Motor strength 4+ bilateral deltoid bicep 4 - bilateral tricep trace finger flexors bilaterally trace finger extensors bilaterally 0 hand intrinsics bilaterally RLE- 0/5 Musculoskeletal: No pain with active assisted as well as passive range of motion in the upper lower limb no joint swelling Sensation intact throughout Psych: frustrated that strength isn't back   Assessment/Plan: 1. Functional deficits secondary to tetraparesis secondary to  cervical myelopathy which require 3+ hours per day of interdisciplinary therapy in a comprehensive inpatient rehab setting.  Physiatrist is providing close team supervision and 24 hour management of active medical problems listed below.  Physiatrist and rehab team continue to assess barriers to discharge/monitor patient progress toward functional and medical goals  Care Tool:  Bathing    Body parts bathed by patient: Right arm, Left arm, Chest, Abdomen, Front perineal area, Right upper leg, Left upper leg, Right lower leg, Face   Body parts bathed by helper: Buttocks, Left lower leg     Bathing assist Assist Level: Minimal Assistance - Patient > 75%     Upper Body Dressing/Undressing Upper body dressing   What is the patient wearing?: Pull over shirt    Upper body assist Assist Level: Supervision/Verbal cueing    Lower Body Dressing/Undressing Lower body dressing      What is the patient wearing?: Incontinence brief, Pants     Lower body assist Assist for lower body dressing: Maximal Assistance - Patient 25 - 49%     Toileting Toileting    Toileting assist Assist for toileting: Maximal Assistance - Patient 25 - 49%     Transfers Chair/bed transfer  Transfers assist     Chair/bed transfer assist level: Total Assistance - Patient < 25%     Locomotion Ambulation   Ambulation assist   Ambulation activity did not occur: Safety/medical concerns          Walk  10 feet activity   Assist  Walk 10 feet activity did not occur: Safety/medical concerns        Walk 50 feet activity   Assist Walk 50 feet with 2 turns activity did not occur: Safety/medical concerns         Walk 150 feet activity   Assist Walk 150 feet activity did not occur: Safety/medical concerns         Walk 10 feet on uneven surface  activity   Assist Walk 10 feet on uneven surfaces activity did not occur: Safety/medical concerns         Wheelchair     Assist  Will patient use wheelchair at discharge?: Yes Type of Wheelchair: Power    Wheelchair assist level: Supervision/Verbal cueing Max wheelchair distance: 200'    Wheelchair 50 feet with 2 turns activity    Assist        Assist Level: Supervision/Verbal cueing   Wheelchair 150 feet activity     Assist      Assist Level: Supervision/Verbal cueing   Blood pressure (!) 112/50, pulse 62, temperature 97.7 F (36.5 C), temperature source Oral, resp. rate 18, height 5\' 5"  (1.651 m), weight 63.6 kg, SpO2 95 %.  Medical Problem List and Plan: 1.   Tetraparesis secondary to cervical myelopathy -patient may  shower -ELOS/Goals: 20 to 24 days  -Continue CIR 2. Antithrombotics: -DVT/anticoagulation:Mechanical:Sequential compression devices, below kneeBilateral lower extremities. Bilateral lower extremity DVTs, age indeterminate, on . Started on Eliquis.   8/9- needs at least 3 months -antiplatelet therapy: N/A 3. Pain Management:Well controlled with prn Tylenol 4. Mood:LCSW to follow for evaluation and support. -antipsychotic agents: N/A 5. Neuropsych: This patient is capable of making decisions on her own behalf. 6. Skin/Wound Care:Monitor wound for healing. Will add protein supplement and multivitamin to promote healing.  8/10- has Stage II pressure ulcer- 2-2.5 cm diameter stage II pressure ulcer- will add turn q2 hours- also informed nurse if up in w/c, has to do pressure relief q30 minutes  8/13- pt sitting up on ulcer- reminded her to get off ulcer- and helped her move bed.   8/17- stay off sacrum unless eating/doing therapy using ROHO.   7. Fluids/Electrolytes/Nutrition:Monitor I/O. Check lytes in am.  8. HTN: Monitor BP tid--continue HCTZ daily.Check BMET/K+ level in am.  8/14: Diastolic is very low- 34. Stop HCTZ  8/15: Diastolic and HR continue to be low- continue to monitor for effects from yesterday's  change.   8/17- BP 114/45- DBP still low- off BP meds 9. Post op labs: Pre-op K+ low normal.   8/9- K+ 3.6 still  8/12- K+ 3.5- low normal- will recheck Monday 10 Neurogenic bladder: Has been incontinent with external catheter--will monitor voiding with PVRs/bladder scan. UA negative. Continue Toviaz 4mg  daily  8/9- U/A was (-) 8/3- same time also had a elevated WBC. Will monitor/WBC  8/11- will check labs tomorrow  8/12- Leukocytosis has resolved  8/14: Continues to have urgency, no dysuria. Has sensation of voiding.  11.Neurogenic bowel: KUB shows large stool burden and no obstruction. Had BM with enema on admission. On daily Miralax, colace TID, Senna HS. Mag citrate x1 on 8/6, bisacodyl 10mg  supp qpm   8/13- no documentation for bowel program- pt said went well   8/14: Has been receiving bowel program.    8/15: refused bowel program last night.   8/16- did bowel program/dig stim only last night  8/17- refused again last night- said nurse said she could- offered it,  but said had good BM the day prior- explained that needs to do daily to do bowel habit. Added lidocaine prn for bowel program, just in case.   8/18- got lidocaine for bowel program last night- went better 12. Insomnia: Trazodone 100mg  did not provide benefit Try Amitriptyline 25mg .   8/15: Has been helping but she slept poorly last night.  13. Leukocytosis: WBC decreased to 12.4. UA/UC negative.   8/9- WBC 12.8- basically stable- con't to monitor 2x/week.   8/12- resolved 14. Bradycardia  8/9- only BP med HCTZ- so is baseline for pt- will monitor  8/11- running 58-60- is likely due to SCI- is common- asymptomatic.  15. Nausea  8/16- give prns meds 16. Sleepiness  8/18- has been really sedated last 2 days- afebrile- doesn't have any new meds- no Sx's of UTI     LOS: 16 days A FACE TO FACE EVALUATION WAS PERFORMED  Haley Robinson 09/19/2019, 7:57 AM

## 2019-09-19 NOTE — Progress Notes (Signed)
Pt suppository given per bowel program. Rectal stimulation given. No stool noted in rectum.

## 2019-09-19 NOTE — Progress Notes (Signed)
Occupational Therapy Session Note  Patient Details  Name: Haley Robinson MRN: [id number] Date of Birth: 06/05/1940  Today's Date: 09/19/2019 OT Individual Time: 0086-7619 OT Individual Time Calculation (min): 70 min    Short Term Goals: Week 3:  OT Short Term Goal 1 (Week 3): Patient will demonstrate squat-pivot transfers with Max A. OT Short Term Goal 2 (Week 3): Pt will maintain dynamic sitting balance EOB/EOM with mod A during functional activities OT Short Term Goal 3 (Week 3): Pt will complete toilting tasks with mod A  Skilled Therapeutic Interventions/Progress Updates:    OT intervention with focus on BADL retraining, bed mobility, sitting balance, SB transfers, and activity tolerance to increase independence with BADLs. Pt requires min/mod A for rolling R/L using bed rails to facilitate LB bathing/dressing. Pt tot A for LB dressing tasks. Supine>sit EOB with HOB elevated at max A. Sitting balance-min A with BUE support and max A without BUE support.  Pt remained seated EOB for UB bathing/dressing with max A for sitting balance. SB transfer to power w/c with max A +2. Pt completed grooming tasks at sink with setup. Pt transferred back to bed and bed to TIS w/c secondary control malfunction in power w/c. All transfers with max A+2. Pt remained in w/c with all needs within reach and belt alarm activated.   Therapy Documentation Precautions:  Precautions Precautions: Fall, Cervical Precaution Booklet Issued: Yes (comment) Precaution Comments: reviewed precautions Required Braces or Orthoses: Cervical Brace Cervical Brace: Soft collar (when OOB) Restrictions Weight Bearing Restrictions: No  Pain:  Pt comments that her bottom feels ok when laying on side but is uncomfortable when laying on back; transerred to TIS w/c for pressure relief   Therapy/Group: Individual Therapy  Rich Brave 09/19/2019, 9:26 AM

## 2019-09-19 NOTE — Progress Notes (Signed)
Physical Therapy Session Note  Patient Details  Name: Haley Robinson MRN: [id number] Date of Birth: Jan 28, 1941  Today's Date: 09/19/2019 PT Individual Time: 1300-1330 PT Individual Time Calculation (min): 30 min   Short Term Goals: Week 3:  PT Short Term Goal 1 (Week 3): Pt will demonstrate ability to perform pressure relief independently via PWC or request assist with pressure relief when in TIS PT Short Term Goal 2 (Week 3): Pt will maintain static sitting balance with close Supervision x 5 min PT Short Term Goal 3 (Week 3): Pt will perform least restrictive transfer with assist x 1  Skilled Therapeutic Interventions/Progress Updates:    Pt received seated in TIS w/c in room, agreeable to PT session. No complaints of pain. Discussed upcoming family education next week, scheduling seating evaluation for TIS vs PWC, and equipment pt will use upon d/c home. Pt expresses concern about her current physical abilities and her ability to regain function. Provided encouragement and emotional support to patient but also acknowledged her physical gains have been minimal up to this point. Assisted pt with performing pressure relief via tilting/reclining in TIS chair. Reminded pt of pressure relief schedule and to call for assist with boosting again after 30 min to prevent further skin breakdown. Pt left semi-reclined in TIS w/c in room with needs in reach, quick release belt and chair alarm in place at end of session.  Therapy Documentation Precautions:  Precautions Precautions: Fall, Cervical Precaution Booklet Issued: Yes (comment) Precaution Comments: reviewed precautions Required Braces or Orthoses: Cervical Brace Cervical Brace: Soft collar (when OOB) Restrictions Weight Bearing Restrictions: No   Therapy/Group: Individual Therapy   Peter Congo, PT, DPT  09/19/2019, 3:41 PM

## 2019-09-19 NOTE — Progress Notes (Signed)
Occupational Therapy Session Note  Patient Details  Name: Haley Robinson MRN: [id number] Date of Birth: 03-21-1940  Today's Date: 09/19/2019 OT Individual Time: 0263-7858 OT Individual Time Calculation (min): 82 min    Short Term Goals: Week 3:  OT Short Term Goal 1 (Week 3): Patient will demonstrate squat-pivot transfers with Max A. OT Short Term Goal 2 (Week 3): Pt will maintain dynamic sitting balance EOB/EOM with mod A during functional activities OT Short Term Goal 3 (Week 3): Pt will complete toilting tasks with mod A  Skilled Therapeutic Interventions/Progress Updates:    Pt sitting up in TIS w/c agreeable to OT session. Pt reporting some pain under scapula during unsupported sitting EOM, with improvement with manual stabilization.  Pt transported to gym to participate in EOM sitting balance activity.  Pt completed sliding board transfer w/c to EOM with max assist +2.  Pt sat at [facility] with CGA and unilateral to bilateral UE support.  Provision of mirror feedback and VCs to improve upright posture.  Pt able to correct momentarily, however due to weakness pt frequently returning to kyphotic posture with forward rounded shoulders.  Pt participated in functional reaching task to match cards on board in front of her with facilitation of forward leaning and lateral weight shifting in order to improve skills during functional transfers and self care.    Pt exhibiting significant winging of right scapula and moderate winging of left scapula indicating periscapular weakness.  Pt completed sit to supine with max assist.  Pt positioned with bolster underneath bilateral knees to reduce back strain.  Pt instructed through serratus anterior strengthening using 1lb dowel 3 x 15 reps to reduce scapular winging and increase proximal stability.    Pt returned to seated position with max assist and sliding board transfer EOM to w/c with max assist.  Pt needing total assist +2 to reposition posteriorly in w/c.   Pt completed ER strengthening bilateral shoulders 3 x 15 using level 1 band.  BLE ball adduction squeezes 3 x 15.  NMES completed using right long flexors using SAEBO estim x10 minutes.  Active flexion noted at ring finger PIP joint during estim.  Encouraged pt to close fist with estim on to encourage functional grasp.  Pt returned to room, call bell in reach, seat alarm on.  Therapy Documentation Precautions:  Precautions Precautions: Fall, Cervical Precaution Booklet Issued: Yes (comment) Precaution Comments: reviewed precautions Required Braces or Orthoses: Cervical Brace Cervical Brace: Soft collar (when OOB) Restrictions Weight Bearing Restrictions: No   Therapy/Group: Individual Therapy  Amie Critchley 09/19/2019, 4:11 PM

## 2019-09-19 NOTE — Plan of Care (Signed)
  Problem: Consults Goal: RH SPINAL CORD INJURY PATIENT EDUCATION Description:  See Patient Education module for education specifics.  Outcome: Progressing Goal: Skin Care Protocol Initiated - if Braden Score 18 or less Description: If consults are not indicated, leave blank or document N/A Outcome: Progressing   Problem: SCI BOWEL ELIMINATION Goal: RH STG MANAGE BOWEL WITH ASSISTANCE Description: STG Manage Bowel with mod Assistance. Outcome: Progressing Goal: RH STG SCI MANAGE BOWEL WITH MEDICATION WITH ASSISTANCE Description: STG SCI Manage bowel with medication with min/mod assistance. Outcome: Progressing   Problem: SCI BLADDER ELIMINATION Goal: RH STG MANAGE BLADDER WITH ASSISTANCE Description: STG Manage Bladder With mod Assistance Outcome: Progressing

## 2019-09-20 ENCOUNTER — Inpatient Hospital Stay (HOSPITAL_COMMUNITY): Payer: Medicare PPO | Admitting: *Deleted

## 2019-09-20 ENCOUNTER — Inpatient Hospital Stay (HOSPITAL_COMMUNITY): Payer: Medicare PPO

## 2019-09-20 ENCOUNTER — Inpatient Hospital Stay (HOSPITAL_COMMUNITY): Payer: Medicare PPO | Admitting: Occupational Therapy

## 2019-09-20 NOTE — Progress Notes (Signed)
Patient continues with bowel program. Program started at 1845. Digital stimulation presented with no stool in chamber and produced no bowel movement. Patient proceeded with suppository. Next shift to finish protocol.

## 2019-09-20 NOTE — Progress Notes (Signed)
Occupational Therapy Session Note  Patient Details  Name: Haley Robinson MRN: [id number] Date of Birth: [birth year]  Today's Date: 09/20/2019 OT Individual Time: 5400-8676 OT Individual Time Calculation (min): 70 min    Short Term Goals: Week 3:  OT Short Term Goal 1 (Week 3): Patient will demonstrate squat-pivot transfers with Max A. OT Short Term Goal 2 (Week 3): Pt will maintain dynamic sitting balance EOB/EOM with mod A during functional activities OT Short Term Goal 3 (Week 3): Pt will complete toilting tasks with mod A  Skilled Therapeutic Interventions/Progress Updates:    Pt resting in bed upon arrival and ready to get OOB. OT intervention with focus on bed mobility, sitting balance, BADL retraining, discharge planning, SB transfers, and activity tolerance to increase independence with BADLs. Rolling R/L with min A using bed rails. Supine>sit EOB with max A. Once in sidelying position with BLE off EOB, pt able to push up to upright with min A for balance. Sitting balance with min A and pt's BUE support. SB transfer to power w/c with max A+2. Pt completed UB bathing/dressing with supervision. Oral care and brushing hair with setup/supervision. Pt voiced concern about prognosis and level of care that will be needed at discharge.  Pt commented that she was "mad" about the outcome of the surgery. Emotional support and encouragement provided. Pt required considerable amount of time to process emotions and frustrations. Pt remained in power w/c with all needs within reach. Seat belt secure.   Therapy Documentation Precautions:  Precautions Precautions: Fall, Cervical Precaution Booklet Issued: Yes (comment) Precaution Comments: reviewed precautions Required Braces or Orthoses: Cervical Brace Cervical Brace: Soft collar (when OOB) Restrictions Weight Bearing Restrictions: No  Pain:  Pt stated her bottom was feeling better   Therapy/Group: Individual Therapy  Rich Brave 09/20/2019, 9:34 AM

## 2019-09-20 NOTE — Progress Notes (Signed)
Physical Therapy Session Note  Patient Details  Name: Haley Robinson MRN: [id number] Date of Birth: 1940-12-09  Today's Date: 09/20/2019 PT Individual Time: 1030-1133 PT Individual Time Calculation (min): 63 min   Short Term Goals: Week 1:  PT Short Term Goal 1 (Week 1): Pt will perform rolling with min A consistently PT Short Term Goal 1 - Progress (Week 1): Met PT Short Term Goal 2 (Week 1): Pt will perform bed mobility with assist x 1 consistently PT Short Term Goal 2 - Progress (Week 1): Progressing toward goal PT Short Term Goal 3 (Week 1): Pt will maintain static sitting balance x 5 min with mod A PT Short Term Goal 3 - Progress (Week 1): Met  Skilled Therapeutic Interventions/Progress Updates:    pt received in power wheelchair tilted back for positioning and agreeable to therapy. Pt had several questions and concerns about DC planning, family training, equipment recommendations; pt educated on current level of assist and therapy recommendations based on DC plan of returning to home with family to assist with pt concerned about needs at this time but understands she will need assistance and care as well as equipment when [place] home. Pt also educated that power chair can be customized to all of her needs and increase her level of mobility and independence and that family training is planned for next week and will be open to family questions, patient's further questions and all education at that time as well. Pt directed in mobility of power chair into upright sitting position with VC for control sequencing, 200' to gym at min A for navigation controls and decreased speed of chair as pt demonstrated difficulty correcting directionally at that current speed level. Pt directed in bean bag toss to target ~4' away with LUE with armrest lifted to increase trunk and core activation and stability, VC to bring trunk away from back of chair for increased challenge, pt able complete this at [facility] with use  of RUE intermittently at R armrest for corrections x15 completed with reaching laterally to retrieve bean bags on table and return to midline to toss, 2x15 completed following this without use of RUE for corrections at armest at CGA-min A to complete. Attempt to direct pt in RUE bean bag toss however pt unable to grasp bean bags with R hand and if holding pt required cupping bean bag in hand to assist in toss x10 completed at min A for stability. Pt directed in powerchair mobility around x5 cones weaving with VC to correct path navigation as needed. Pt directed in 200' return to room in power chair at [facility] and VC for technique, pt requested to return to bed to rest and required use of bedpan. Pt required max A of power chair navigation to correctly and safely position for transfer setup. Pt directed in slide board transfer max A for placement and mod A x2 to transfer to EOB, max A x1 for sit>supine and mod A for rolling for bedpan placement. Pt reported feeling comfortable and left 3 rails up, All needs in reach and in good condition. Call light in hand and bed alarm set. NCT outside of pt room and educated that pt left on bedpan, verbalized understanding.   Therapy Documentation Precautions:  Precautions Precautions: Fall, Cervical Precaution Booklet Issued: Yes (comment) Precaution Comments: reviewed precautions Required Braces or Orthoses: Cervical Brace Cervical Brace: Soft collar (when OOB) Restrictions Weight Bearing Restrictions: No   Pain: Pain Assessment Pain Scale: 0-10 Pain Score: 0-No pain  Therapy/Group: Individual Therapy  Junie Panning 09/20/2019, 11:42 AM

## 2019-09-20 NOTE — Progress Notes (Signed)
Pt is in bowel program. Had a bowel movement at 1940, type 6 BM. Pt has no complaints of pain or discomfort.   Bridgett Larsson LPN

## 2019-09-20 NOTE — Plan of Care (Signed)
Problem: RH Balance Goal: LTG: Patient will maintain dynamic sitting balance (OT) Description: LTG:  Patient will maintain dynamic sitting balance with assistance during activities of daily living (OT) 09/20/2019 1527 by Melonie Florida, OT Reactivated 09/20/2019 1522 by Melonie [place], OT Outcome: Not Applicable Flowsheets (Taken 09/20/2019 1522) LTG: Pt will maintain dynamic sitting balance during ADLs with: (downgraded JLS) Minimal Assistance - Patient > 75% Note: Downgraded due to progress Goal: LTG Patient will maintain dynamic standing with ADLs (OT) Description: LTG:  Patient will maintain dynamic standing balance with assist during activities of daily living (OT)  09/20/2019 1527 by Melonie Florida, OT Reactivated 09/20/2019 1522 by Melonie Florida, OT Outcome: Not Applicable Flowsheets (Taken 09/20/2019 1522) LTG: Pt will maintain dynamic standing balance during ADLs with: (d/c) -- Note: D/c goal due to progress    Problem: RH Balance Goal: LTG Patient will maintain dynamic standing with ADLs (OT) Description: LTG:  Patient will maintain dynamic standing balance with assist during activities of daily living (OT)  09/20/2019 1527 by Melonie Florida, OT Reactivated 09/20/2019 1522 by Melonie Florida, OT Outcome: Not Applicable Flowsheets (Taken 09/20/2019 1522) LTG: Pt will maintain dynamic standing balance during ADLs with: (d/c) -- Note: D/c goal due to progress    Problem: Sit to Stand Goal: LTG:  Patient will perform sit to stand in prep for activites of daily living with assistance level (OT) Description: LTG:  Patient will perform sit to stand in prep for activites of daily living with assistance level (OT) 09/20/2019 1527 by Melonie Florida, OT Reactivated 09/20/2019 1522 by Melonie Florida, OT Outcome: Not Applicable Flowsheets (Taken 09/20/2019 1522) LTG: PT will perform sit to stand in prep for activites of daily living with assistance level: (d/c goal)  --   Problem: RH Eating Goal: LTG Patient will perform eating w/assist, cues/equip (OT) Description: LTG: Patient will perform eating with assist, with/without cues using equipment (OT) 09/20/2019 1527 by Melonie Florida, OT Reactivated 09/20/2019 1522 by Melonie Florida, OT Outcome: Not Applicable Flowsheets (Taken 09/20/2019 1522) LTG: Pt will perform eating with assistance level of: (downgraded) Independent with assistive device  Note: Downgraded due to progress   Problem: RH Grooming Goal: LTG Patient will perform grooming w/assist,cues/equip (OT) Description: LTG: Patient will perform grooming with assist, with/without cues using equipment (OT) 09/20/2019 1527 by Melonie Florida, OT Reactivated 09/20/2019 1522 by Melonie Florida, OT Outcome: Not Applicable Flowsheets (Taken 09/20/2019 1522) LTG: Pt will perform grooming with assistance level of: (Downgraded due to progress) Independent with assistive device  Note: Downgraded due to progress   Problem: RH Bathing Goal: LTG Patient will bathe all body parts with assist levels (OT) Description: LTG: Patient will bathe all body parts with assist levels (OT) 09/20/2019 1527 by Melonie Florida, OT Reactivated 09/20/2019 1522 by Melonie Florida, OT Outcome: Not Applicable   Problem: RH Dressing Goal: LTG Patient will perform upper body dressing (OT) Description: LTG Patient will perform upper body dressing with assist, with/without cues (OT). 09/20/2019 1527 by Melonie Florida, OT Reactivated 09/20/2019 1522 by Melonie Florida, OT Outcome: Not Applicable Goal: LTG Patient will perform lower body dressing w/assist (OT) Description: LTG: Patient will perform lower body dressing with assist, with/without cues in positioning using equipment (OT) 09/20/2019 1527 by Melonie Florida, OT Reactivated 09/20/2019 1522 by Melonie Florida, OT Outcome: Not Applicable Flowsheets (Taken 09/20/2019 1522) LTG: Pt will perform lower  body dressing with assistance level of: (Downgraded  due to progress) Moderate Assistance - Patient 50 - 74% Note: Downgraded due to progress   Problem: RH Toileting Goal: LTG Patient will perform toileting task (3/3 steps) with assistance level (OT) Description: LTG: Patient will perform toileting task (3/3 steps) with assistance level (OT)  09/20/2019 1527 by Melonie Florida, OT Reactivated 09/20/2019 1522 by Melonie Florida, OT Outcome: Not Applicable Flowsheets (Taken 09/20/2019 1522) LTG: Pt will perform toileting task (3/3 steps) with assistance level: (d/c goal) -- Note: D/c due to progress    Problem: RH Toilet Transfers Goal: LTG Patient will perform toilet transfers w/assist (OT) Description: LTG: Patient will perform toilet transfers with assist, with/without cues using equipment (OT) 09/20/2019 1527 by Melonie Florida, OT Reactivated 09/20/2019 1522 by Melonie Florida, OT Outcome: Not Applicable Flowsheets (Taken 09/20/2019 1522) LTG: Pt will perform toilet transfers with assistance level of: (Downgraded due to progress) Moderate Assistance - Patient 50 - 74% Note: Downgraded due to progress   Problem: RH Tub/Shower Transfers Goal: LTG Patient will perform tub/shower transfers w/assist (OT) Description: LTG: Patient will perform tub/shower transfers with assist, with/without cues using equipment (OT) 09/20/2019 1527 by Melonie Florida, OT Reactivated 09/20/2019 1522 by Melonie Florida, OT Outcome: Not Applicable Flowsheets (Taken 09/20/2019 1522) LTG: Pt will perform tub/shower stall transfers with assistance level of: (d/c goal) -- Note: D/c due to progress

## 2019-09-20 NOTE — Plan of Care (Signed)
  Problem: Consults Goal: RH SPINAL CORD INJURY PATIENT EDUCATION Description:  See Patient Education module for education specifics.  Outcome: Progressing Goal: Skin Care Protocol Initiated - if Braden Score 18 or less Description: If consults are not indicated, leave blank or document N/A Outcome: Progressing   Problem: SCI BOWEL ELIMINATION Goal: RH STG MANAGE BOWEL WITH ASSISTANCE Description: STG Manage Bowel with mod Assistance. Outcome: Progressing Goal: RH STG SCI MANAGE BOWEL WITH MEDICATION WITH ASSISTANCE Description: STG SCI Manage bowel with medication with min/mod assistance. Outcome: Progressing   Problem: SCI BLADDER ELIMINATION Goal: RH STG MANAGE BLADDER WITH ASSISTANCE Description: STG Manage Bladder With mod Assistance Outcome: Progressing   Problem: RH SKIN INTEGRITY Goal: RH STG SKIN FREE OF INFECTION/BREAKDOWN Description: Pt will be free of skin breakdown/infection prior to DC with mod/max assist Outcome: Progressing Goal: RH STG MAINTAIN SKIN INTEGRITY WITH ASSISTANCE Description: STG Maintain Skin Integrity With mod/max Assistance. Outcome: Progressing   Problem: RH SAFETY Goal: RH STG ADHERE TO SAFETY PRECAUTIONS W/ASSISTANCE/DEVICE Description: STG Adhere to Safety Precautions With cues and reminders Assistance/Device. Outcome: Progressing   

## 2019-09-20 NOTE — Progress Notes (Signed)
[facility] PHYSICAL MEDICINE & REHABILITATION PROGRESS NOTE   Subjective/Complaints:   Reports sleepiness has improved dramatically- not sleepy yesterday.   Upset that hasn't improved to the point can care for self at home.  Went over this at length that it takes up to 1 year to regain what SHE will regain- that doesn't mean will get 100% return.   Daughter also asking ot speak with me- will discuss with her today as well about prognosis, and Sx's.    ROS:   Pt denies SOB, abd pain, CP, N/V/C/D, and vision changes   Objective:   No results found. No results for input(s): WBC, HGB, HCT, PLT in the last 72 hours. No results for input(s): NA, K, CL, CO2, GLUCOSE, BUN, CREATININE, CALCIUM in the last 72 hours.  Intake/Output Summary (Last 24 hours) at 09/20/2019 0846 Last data filed at 09/20/2019 0824 Gross per 24 hour  Intake 320 ml  Output --  Net 320 ml     Physical Exam: Vital Signs Blood pressure 113/75, pulse (!) 58, temperature 98.2 F (36.8 C), resp. rate 20, height 5\' 5"  (1.651 m), weight 63.6 kg, SpO2 99 %. General: alert, laying supine- on sacrum again, NAD HEENT: conjugate gaze Neck: Supple without JVD or lymphadenopathy Heart: RRR Chest: CTA B/L- no W/R/R- good air movement Abdomen: Soft, NT, ND, (+)BS  Extremities: No clubbing, cyanosis, or edema. Pulses are 2+ Skin: < 2 cm- open- oval shaped- just the top layer gone, but very superficial - between buttocks- in crease- Stage II- not anywhere near Stage III- no change- pt laying on her sacrum when I came in Neurologic: Tone mild hyperreflexia right lower extremity no clonus at the ankles bilaterally no evidence of flexor withdrawal Motor strength 4+ bilateral deltoid bicep 4 - bilateral tricep trace finger flexors bilaterally trace finger extensors bilaterally 0 hand intrinsics bilaterally RLE- 0/5 Musculoskeletal: No pain with active assisted as well as passive range of motion in the upper lower limb no joint  swelling Sensation intact throughout Psych: very frustrated over recovery   Assessment/Plan: 1. Functional deficits secondary to tetraparesis secondary to cervical myelopathy which require 3+ hours per day of interdisciplinary therapy in a comprehensive inpatient rehab setting.  Physiatrist is providing close team supervision and 24 hour management of active medical problems listed below.  Physiatrist and rehab team continue to assess barriers to discharge/monitor patient progress toward functional and medical goals  Care Tool:  Bathing    Body parts bathed by patient: Right arm, Left arm, Chest, Abdomen, Front perineal area, Right upper leg, Left upper leg, Right lower leg, Face   Body parts bathed by helper: Buttocks, Left lower leg     Bathing assist Assist Level: Minimal Assistance - Patient > 75%     Upper Body Dressing/Undressing Upper body dressing   What is the patient wearing?: Pull over shirt    Upper body assist Assist Level: Supervision/Verbal cueing    Lower Body Dressing/Undressing Lower body dressing      What is the patient wearing?: Incontinence brief, Pants     Lower body assist Assist for lower body dressing: Maximal Assistance - Patient 25 - 49%     Toileting Toileting    Toileting assist Assist for toileting: Maximal Assistance - Patient 25 - 49%     Transfers Chair/bed transfer  Transfers assist     Chair/bed transfer assist level: Total Assistance - Patient < 25%     Locomotion Ambulation   Ambulation assist   Ambulation  activity did not occur: Safety/medical concerns          Walk 10 feet activity   Assist  Walk 10 feet activity did not occur: Safety/medical concerns        Walk 50 feet activity   Assist Walk 50 feet with 2 turns activity did not occur: Safety/medical concerns         Walk 150 feet activity   Assist Walk 150 feet activity did not occur: Safety/medical concerns         Walk 10 feet  on uneven surface  activity   Assist Walk 10 feet on uneven surfaces activity did not occur: Safety/medical concerns         Wheelchair     Assist Will patient use wheelchair at discharge?: Yes Type of Wheelchair: Power    Wheelchair assist level: Supervision/Verbal cueing Max wheelchair distance: 200'    Wheelchair 50 feet with 2 turns activity    Assist        Assist Level: Supervision/Verbal cueing   Wheelchair 150 feet activity     Assist      Assist Level: Supervision/Verbal cueing   Blood pressure 113/75, pulse (!) 58, temperature 98.2 F (36.8 C), resp. rate 20, height 5\' 5"  (1.651 m), weight 63.6 kg, SpO2 99 %.  Medical Problem List and Plan: 1.   Tetraparesis secondary to cervical myelopathy -patient may  shower -ELOS/Goals: 20 to 24 days  -Continue CIR 2. Antithrombotics: -DVT/anticoagulation:Mechanical:Sequential compression devices, below kneeBilateral lower extremities. Bilateral lower extremity DVTs, age indeterminate, on . Started on Eliquis.   8/9- needs at least 3 months -antiplatelet therapy: N/A 3. Pain Management:Well controlled with prn Tylenol 4. Mood:LCSW to follow for evaluation and support. -antipsychotic agents: N/A 5. Neuropsych: This patient is capable of making decisions on her own behalf. 6. Skin/Wound Care:Monitor wound for healing. Will add protein supplement and multivitamin to promote healing.  8/10- has Stage II pressure ulcer- 2-2.5 cm diameter stage II pressure ulcer- will add turn q2 hours- also informed nurse if up in w/c, has to do pressure relief q30 minutes  8/13- pt sitting up on ulcer- reminded her to get off ulcer- and helped her move bed.   8/17- stay off sacrum unless eating/doing therapy using ROHO.    8/19- pt seen on sacrum -supine this AM- asked her to try and stay turned to side unless eating/therapy.  7.  Fluids/Electrolytes/Nutrition:Monitor I/O. Check lytes in am.  8. HTN: Monitor BP tid--continue HCTZ daily.Check BMET/K+ level in am.  8/14: Diastolic is very low- 34. Stop HCTZ  8/15: Diastolic and HR continue to be low- continue to monitor for effects from yesterday's change.   8/17- BP 114/45- DBP still low- off BP meds 9. Post op labs: Pre-op K+ low normal.   8/9- K+ 3.6 still  8/12- K+ 3.5- low normal- will recheck Monday 10 Neurogenic bladder: Has been incontinent with external catheter--will monitor voiding with PVRs/bladder scan. UA negative. Continue Toviaz 4mg  daily  8/9- U/A was (-) 8/3- same time also had a elevated WBC. Will monitor/WBC  8/11- will check labs tomorrow  8/12- Leukocytosis has resolved  8/14: Continues to have urgency, no dysuria. Has sensation of voiding.  11.Neurogenic bowel: KUB shows large stool burden and no obstruction. Had BM with enema on admission. On daily Miralax, colace TID, Senna HS. Mag citrate x1 on 8/6, bisacodyl 10mg  supp qpm  8/19- good results with bowel program- documented well 12. Insomnia: Trazodone 100mg  did not provide benefit Try  Amitriptyline 25mg .   8/15: Has been helping but she slept poorly last night.  13. Leukocytosis: WBC decreased to 12.4. UA/UC negative.   8/9- WBC 12.8- basically stable- con't to monitor 2x/week.   8/12- resolved 14. Bradycardia  8/9- only BP med HCTZ- so is baseline for pt- will monitor  8/11- running 58-60- is likely due to SCI- is common- asymptomatic.  15. Nausea  8/16- give prns meds 16. Sleepiness  8/18- has been really sedated last 2 days- afebrile- doesn't have any new meds- no Sx's of UTI  8/19- resolved 17. Dispo  8/19- pt said daughters cannot care for her, but doesn't want to go to SNF- will discuss with pt/daughter this afternoon.      LOS: 17 days A FACE TO FACE EVALUATION WAS PERFORMED  Haley Robinson 09/20/2019, 8:46 AM

## 2019-09-20 NOTE — Progress Notes (Signed)
Occupational Therapy Session Note  Patient Details  Name: Haley Robinson MRN: [id number] Date of Birth: [birth year]  Today's Date: 09/20/2019 OT Individual Time: 1315-1400 OT Individual Time Calculation (min): 45 min  and Today's Date: 09/20/2019 OT Missed Time: 15 Minutes Missed Time Reason: Other (comment) (MD meeting with family and pt)   Short Term Goals: Week 3:  OT Short Term Goal 1 (Week 3): Patient will demonstrate squat-pivot transfers with Max A. OT Short Term Goal 2 (Week 3): Pt will maintain dynamic sitting balance EOB/EOM with mod A during functional activities OT Short Term Goal 3 (Week 3): Pt will complete toilting tasks with mod A  Skilled Therapeutic Interventions/Progress Updates:    Pt missed 15 mins skilled OT services at beginning of session while MD was discussing prognosis, etc with pt and family.  OT intervention with focus on bed mobility, SB transfers, and sitting balance to increase independence with BADLs. Bed mobility with max A and sidelying>sitting EOB with mod A. Sitting balance EOB with min A. SB transfers with max A+2. Pt transferred to [facility] and engaged in lateral weight shifts and reaching to facilitate weight shifts while maintaining sitting balance.  Pt required CGA and max verbal cues to complete activities. Pt returned to w/c and remained in PWC in room. Seat belt secure, Daughter present. All needs within reach.  Therapy Documentation Precautions:  Precautions Precautions: Fall, Cervical Precaution Booklet Issued: Yes (comment) Precaution Comments: reviewed precautions Required Braces or Orthoses: Cervical Brace Cervical Brace: Soft collar (when OOB) Restrictions Weight Bearing Restrictions: No General: General OT Amount of Missed Time: 15 Minutes   Therapy/Group: Individual Therapy  Rich Brave 09/20/2019, 2:30 PM

## 2019-09-21 ENCOUNTER — Inpatient Hospital Stay (HOSPITAL_COMMUNITY): Payer: Medicare PPO

## 2019-09-21 ENCOUNTER — Inpatient Hospital Stay (HOSPITAL_COMMUNITY): Payer: Medicare PPO | Admitting: Physical Therapy

## 2019-09-21 ENCOUNTER — Inpatient Hospital Stay (HOSPITAL_COMMUNITY): Payer: Medicare PPO | Admitting: Occupational Therapy

## 2019-09-21 MED ORDER — POLYETHYLENE GLYCOL 3350 17 G PO PACK
17.0000 g | PACK | Freq: Every day | ORAL | Status: DC
  Administered 2019-09-22 – 2019-10-03 (×12): 17 g via ORAL
  Filled 2019-09-21 (×12): qty 1

## 2019-09-21 NOTE — Progress Notes (Signed)
Occupational Therapy Session Note  Patient Details  Name: Haley Robinson MRN: [id number] Date of Birth: 28-Oct-1940  Today's Date: 09/21/2019 OT Individual Time: 1345-1500 OT Individual Time Calculation (min): 75 min    Short Term Goals: Week 3:  OT Short Term Goal 1 (Week 3): Patient will demonstrate squat-pivot transfers with Max A. OT Short Term Goal 2 (Week 3): Pt will maintain dynamic sitting balance EOB/EOM with mod A during functional activities OT Short Term Goal 3 (Week 3): Pt will complete toilting tasks with mod A  Skilled Therapeutic Interventions/Progress Updates:  Patient met seated in [place] in agreement with OT treatment session with focus on self-care re-education, functional transfers, and dynamic sitting balance as detailed below. Hair washing seated at sink level in PWC with total A. Patient able to brush hair with LUE and gross grasp. Doffing/donning UB clothing with supervision A and good carryover of compensatory techniques. PWC transport from room to therapy gym with supervision A and occasional cues for steering. Max A +2 SB transfer <> mat table and cues for hand placement and technique. Patient engaged in therapeutic activity with focus on dynamic sitting balance in prep for functional tranfers. Session concluded with patient seated in [place] with daughter present at bedside, belt alarm activated, and all call bell within reach.    Therapy Documentation Precautions:  Precautions Precautions: Fall, Cervical Precaution Booklet Issued: Yes (comment) Precaution Comments: reviewed precautions Required Braces or Orthoses: Cervical Brace Cervical Brace: Soft collar (when OOB) Restrictions Weight Bearing Restrictions: No General:    Therapy/Group: Individual Therapy  Brady Plant R [place]Davis 09/21/2019, 3:11 PM

## 2019-09-21 NOTE — Progress Notes (Signed)
Physical Therapy Session Note  Patient Details  Name: Haley Robinson MRN: [id number] Date of Birth: Aug 31, 1940  Today's Date: 09/21/2019 PT Individual Time: 6294-7654 and 1050-1200 PT Individual Time Calculation (min): 39 min  And 70 min.  Short Term Goals: Week 2:  PT Short Term Goal 1 (Week 2): Pt will perform least restricive transfer with assist x 1 consistently PT Short Term Goal 1 - Progress (Week 2): Progressing toward goal PT Short Term Goal 2 (Week 2): Pt will perform w/c mobility x 50 ft with LRAD PT Short Term Goal 2 - Progress (Week 2): Met PT Short Term Goal 3 (Week 2): Pt will perform bed mobility with assist x 1 consistently PT Short Term Goal 3 - Progress (Week 2): Met  Skilled Therapeutic Interventions/Progress Updates:   First session:   Pt presents supine in bed w/ NT, just finishing w/ bed pan.  Pt performed rolling to Left w/ manual assist to flex R knee and then reaches for side rail, able to maintain side-lying for peri-care by NT.  Pt continent of urine.  Pt rolled to supine and brief re-attached.  Pt required total A to thread pants over feet, but w/ hook-lying position able to pull pants to buttocks.  Pt rolled w/ min A to B sides using rails to pull pants over hips.  Pt able to actively flex LLE, but manual assist for RLE.  Pt required max A for r sidelying to sit and then to achieve solid midline sitting.  Pt required assist for right lean to position SB by 2nd person.  Pt required mod A x 2 for SB transfers w/ verbal cues for forward lean and clearing buttocks using UEs.  Pt performed w/c mobility w/ power chair in confined spaces and then in hallways.  Increased speed when in open spaces..  Pt able to negotiate back to bedside when finished.  Pt able to manage controls for TIS to relieve pressure.  Pt also performed unilateral pressure relief w/ side leans.  Chair alarm and buckle seat belt on and all needs in reach.  Second session:   Pt presents in TIS in room  tilted back and agreeable to therapy.  Pt able to turn on and bring chair to neutral and then negotiate out of room to hallway.  When in confined spaces, pt more hesitant d/t "quick movements" of TIS, but performs well w/ min verbal cueing.  Pt negotiated in hallways, onto elevator and performed turning in elevator to exit.  Pt performed mobility w/ power chair outside on uneven surfaces, including changing surface and ramped.  Pt  Requires seated rest and requests tilt for pressure reduction w/ only min cues.  Pt also performed leaning side to side for unilateral pressure relief.Pt returned to building and negotiated power chair into gym and positioned at mat table for SB transfer to mat table.  Pt required max A for placing slide board w/ right side lean.  Pt required mod A x 2 for transfer w/c <> mat table w/ verbal cues for UE use and forward lean.  Pt performed seated balance activities w/ and w/o UE use for support, verbal cues for improved anterior pelvic tilt, but unable to maintain > 10 seconds.  Pt transferred to w/c w/ SB, and then verbal cues for forward lean to scoot hips back into w/c.  Pt returned to room and performed tilting back in w/c.  Chair alarm and seat belt on w/ all needs in reach.  Therapy Documentation Precautions:  Precautions Precautions: Fall, Cervical Precaution Booklet Issued: Yes (comment) Precaution Comments: reviewed precautions Required Braces or Orthoses: Cervical Brace Cervical Brace: Soft collar (when OOB) Restrictions Weight Bearing Restrictions: No General:   Vital Signs:  Pain: no c/o pain. Pain Assessment Pain Scale: 0-10 Pain Score: 0-No pain    Therapy/Group: Individual Therapy  Ladoris Gene 09/21/2019, 9:53 AM

## 2019-09-21 NOTE — Progress Notes (Signed)
Patient bowel program started at 1845. Patient presented with no stool in chamber and suppository was introduced.

## 2019-09-21 NOTE — Progress Notes (Signed)
[place] PHYSICAL MEDICINE & REHABILITATION PROGRESS NOTE   Subjective/Complaints:   Spoke with daughter-  Spent an additional 25 minutes talking with her about prognosis, and overall outcome and how long it takes for recovery to occur, if it will.   Pt also noted this AM- having loose stools after bowel program- and asked me to reduce Miralax to qday- I did this.    ROS:    Pt denies SOB, abd pain, CP, N/V/C/D, and vision changes   Objective:   No results found. No results for input(s): WBC, HGB, HCT, PLT in the last 72 hours. No results for input(s): NA, K, CL, CO2, GLUCOSE, BUN, CREATININE, CALCIUM in the last 72 hours.  Intake/Output Summary (Last 24 hours) at 09/21/2019 0925 Last data filed at 09/21/2019 0827 Gross per 24 hour  Intake 580 ml  Output --  Net 580 ml     Physical Exam: Vital Signs Blood pressure (!) 125/46, pulse 61, temperature 97.7 F (36.5 C), resp. rate 16, height 5\' 5"  (1.651 m), weight 61.2 kg, SpO2 97 %. General: alert, laying on side- off sacrum, appropriate, NAD; sleepy HEENT: conjugate gaze Neck: Supple without JVD or lymphadenopathy Heart: RRR Chest: CTA B/L- no W/R/R- good air movement Abdomen: Soft, NT, ND, (+)BS - hyperactive  Extremities: No clubbing, cyanosis, or edema. Pulses are 2+ Skin: < 2 cm- open- oval shaped- just the top layer gone, but very superficial - between buttocks- in crease- Stage II- not anywhere near Stage III- no change-pt laying on side this AM- which is good Neurologic: Tone mild hyperreflexia right lower extremity no clonus at the ankles bilaterally no evidence of flexor withdrawal Motor strength 4+ bilateral deltoid bicep 4 - bilateral tricep trace finger flexors bilaterally trace finger extensors bilaterally 0 hand intrinsics bilaterally RLE- 0/5 Musculoskeletal: No pain with active assisted as well as passive range of motion in the upper lower limb no joint swelling Sensation intact throughout Psych: less  frustrated this AM  Assessment/Plan: 1. Functional deficits secondary to tetraparesis secondary to cervical myelopathy which require 3+ hours per day of interdisciplinary therapy in a comprehensive inpatient rehab setting.  Physiatrist is providing close team supervision and 24 hour management of active medical problems listed below.  Physiatrist and rehab team continue to assess barriers to discharge/monitor patient progress toward functional and medical goals  Care Tool:  Bathing    Body parts bathed by patient: Right arm, Left arm, Chest, Abdomen, Front perineal area, Right upper leg, Left upper leg, Right lower leg, Face   Body parts bathed by helper: Buttocks, Left lower leg     Bathing assist Assist Level: Minimal Assistance - Patient > 75%     Upper Body Dressing/Undressing Upper body dressing   What is the patient wearing?: Pull over shirt    Upper body assist Assist Level: Supervision/Verbal cueing    Lower Body Dressing/Undressing Lower body dressing      What is the patient wearing?: Incontinence brief, Pants     Lower body assist Assist for lower body dressing: Maximal Assistance - Patient 25 - 49%     Toileting Toileting    Toileting assist Assist for toileting: Maximal Assistance - Patient 25 - 49%     Transfers Chair/bed transfer  Transfers assist     Chair/bed transfer assist level: Total Assistance - Patient < 25%     Locomotion Ambulation   Ambulation assist   Ambulation activity did not occur: Safety/medical concerns  Walk 10 feet activity   Assist  Walk 10 feet activity did not occur: Safety/medical concerns        Walk 50 feet activity   Assist Walk 50 feet with 2 turns activity did not occur: Safety/medical concerns         Walk 150 feet activity   Assist Walk 150 feet activity did not occur: Safety/medical concerns         Walk 10 feet on uneven surface  activity   Assist Walk 10 feet on  uneven surfaces activity did not occur: Safety/medical concerns         Wheelchair     Assist Will patient use wheelchair at discharge?: Yes Type of Wheelchair: Power    Wheelchair assist level: Supervision/Verbal cueing Max wheelchair distance: 200'    Wheelchair 50 feet with 2 turns activity    Assist        Assist Level: Supervision/Verbal cueing   Wheelchair 150 feet activity     Assist      Assist Level: Supervision/Verbal cueing   Blood pressure (!) 125/46, pulse 61, temperature 97.7 F (36.5 C), resp. rate 16, height 5\' 5"  (1.651 m), weight 61.2 kg, SpO2 97 %.  Medical Problem List and Plan: 1.   Tetraparesis secondary to cervical myelopathy -patient may  shower -ELOS/Goals: 20 to 24 days  -Continue CIR 2. Antithrombotics: -DVT/anticoagulation:Mechanical:Sequential compression devices, below kneeBilateral lower extremities. Bilateral lower extremity DVTs, age indeterminate, on . Started on Eliquis.   8/9- needs at least 3 months -antiplatelet therapy: N/A 3. Pain Management:Well controlled with prn Tylenol 4. Mood:LCSW to follow for evaluation and support. -antipsychotic agents: N/A 5. Neuropsych: This patient is capable of making decisions on her own behalf. 6. Skin/Wound Care:Stage II sacral ulcer-Monitor wound for healing. Will add protein supplement and multivitamin to promote healing.  8/20- laying on side this AM- off Stage II sacral ulcer- looking stable 7. Fluids/Electrolytes/Nutrition:Monitor I/O. Check lytes in am.  8. HTN: Monitor BP tid--continue HCTZ daily.Check BMET/K+ level in am.  8/20- Off HCTZ- DBP in 40s, but SBP well controlled- not too low 9. Post op labs: Pre-op K+ low normal.   8/20- K+ 3.5- labs q Monday 10 Neurogenic bladder: Has been incontinent with external catheter--will monitor voiding with PVRs/bladder scan. UA negative. Continue Toviaz 4mg   daily  8/14: Continues to have urgency, no dysuria. Has sensation of voiding.   8/20- still has 50% of time bladder incontinence- will send to Neuro-Urology after d/c.  11.Neurogenic bowel: KUB shows large stool burden and no obstruction. Had BM with enema on admission. On daily Miralax, colace TID, Senna HS.  bisacodyl 10mg  supp qpm  8/20- reduce Miralax to daily to help with loose stools- still getting bowel program nightly with lidocaine  12. Insomnia: Trazodone 100mg  did not provide benefit Try Amitriptyline 25mg .   8/15: Has been helping but she slept poorly last night.  13. Leukocytosis: WBC decreased to 12.4. UA/UC negative.    8/12- resolved 14. Bradycardia  8/20- asymptomatic- running low 60s- con't to monitor 15. Nausea  8/16- give prns meds 16. Sleepiness  8/18- has been really sedated last 2 days- afebrile- doesn't have any new meds- no Sx's of UTI  8/19- resolved 17. Dispo  8/19- pt said daughters cannot care for her, but doesn't want to go to SNF- will discuss with pt/daughter this afternoon.   8/20- spoke with pt and daughter for additional 25 minutes- so total care 40 minutes today- as detailed above.  LOS: 18 days A FACE TO FACE EVALUATION WAS PERFORMED  Lyn Joens 09/21/2019, 9:25 AM

## 2019-09-21 NOTE — Plan of Care (Signed)
°  Problem: Consults Goal: RH SPINAL CORD INJURY PATIENT EDUCATION Description:  See Patient Education module for education specifics.  Outcome: Progressing Goal: Skin Care Protocol Initiated - if Braden Score 18 or less Description: If consults are not indicated, leave blank or document N/A Outcome: Progressing   Problem: SCI BOWEL ELIMINATION Goal: RH STG MANAGE BOWEL WITH ASSISTANCE Description: STG Manage Bowel with mod Assistance. Outcome: Progressing Goal: RH STG SCI MANAGE BOWEL WITH MEDICATION WITH ASSISTANCE Description: STG SCI Manage bowel with medication with min/mod assistance. Outcome: Progressing   Problem: SCI BLADDER ELIMINATION Goal: RH STG MANAGE BLADDER WITH ASSISTANCE Description: STG Manage Bladder With mod Assistance Outcome: Progressing   Problem: RH SKIN INTEGRITY Goal: RH STG SKIN FREE OF INFECTION/BREAKDOWN Description: Pt will be free of skin breakdown/infection prior to DC with mod/max assist Outcome: Progressing Goal: RH STG MAINTAIN SKIN INTEGRITY WITH ASSISTANCE Description: STG Maintain Skin Integrity With mod/max Assistance. Outcome: Progressing   Problem: RH SAFETY Goal: RH STG ADHERE TO SAFETY PRECAUTIONS W/ASSISTANCE/DEVICE Description: STG Adhere to Safety Precautions With cues and reminders Assistance/Device. Outcome: Progressing   

## 2019-09-22 ENCOUNTER — Inpatient Hospital Stay (HOSPITAL_COMMUNITY): Payer: Medicare PPO

## 2019-09-22 ENCOUNTER — Inpatient Hospital Stay (HOSPITAL_COMMUNITY): Payer: Medicare PPO | Admitting: Physical Therapy

## 2019-09-22 DIAGNOSIS — K592 Neurogenic bowel, not elsewhere classified: Secondary | ICD-10-CM

## 2019-09-22 DIAGNOSIS — N319 Neuromuscular dysfunction of bladder, unspecified: Secondary | ICD-10-CM

## 2019-09-22 MED ORDER — DOCUSATE SODIUM 100 MG PO CAPS
100.0000 mg | ORAL_CAPSULE | Freq: Two times a day (BID) | ORAL | Status: DC
  Administered 2019-09-22 – 2019-09-23 (×2): 100 mg via ORAL
  Filled 2019-09-22 (×2): qty 1

## 2019-09-22 NOTE — Progress Notes (Signed)
Occupational Therapy Session Note  Patient Details  Name: Haley Robinson MRN: [id number] Date of Birth: 1941/01/15  Today's Date: 09/22/2019 OT Individual Time: 1015-1100 OT Individual Time Calculation (min): 45 min    Short Term Goals: Week 1:  OT Short Term Goal 1 (Week 1): Patient will maintain unsupported static sitting balance with Min A in prep for BADLs. OT Short Term Goal 1 - Progress (Week 1): Met OT Short Term Goal 2 (Week 1): Patient will don UB clothing with Mod A and use of compensatory technique. OT Short Term Goal 2 - Progress (Week 1): Met OT Short Term Goal 3 (Week 1): Patient will demonstrate squat-pivot transfers with Max A. OT Short Term Goal 3 - Progress (Week 1): Progressing toward goal OT Short Term Goal 4 (Week 1): Patient will thread 1 LE through LB clothing in circle sitting with use of compensatory technique in prep for completion of LB BADLs. OT Short Term Goal 4 - Progress (Week 1): Progressing toward goal  Skilled Therapeutic Interventions/Progress Updates:    OT session focused on strengthening and increasing functional use of hands. Pt received in [place] controlling independently to transfer to therapy gym. Engaged in peg board activity with pt placing ~15 pegs with left with minimal dropping. Pt unable to independently remove with R hand d/t weakness, therefore OT provided min-mod assist. Engaged in checkers game on vertical surface to facilitate UB strength, core strength, and grasping skills. Pt utilized LUE throughout for task, demonstrating 70% accuracy to slide magnetic piece. Pt independently completed forward leaning ~8 times to reach for pieces. At end of session, pt returned to room and left sitting in [place] with all needs in reach.   Therapy Documentation Precautions:  Precautions Precautions: Fall, Cervical Precaution Booklet Issued: Yes (comment) Precaution Comments: reviewed precautions Required Braces or Orthoses: Cervical Brace Cervical Brace:  Soft collar (when OOB) Restrictions Weight Bearing Restrictions: No General:   Vital Signs: Therapy Vitals Temp: 98 F (36.7 C) Temp Source: Oral Pulse Rate: 63 Resp: 15 BP: (!) 117/49 Patient Position (if appropriate): Sitting Oxygen Therapy SpO2: 98 % O2 Device: Room Air Pain: Pain Assessment Pain Scale: 0-10 Pain Score: 0-No pain ADL: ADL Upper Body Bathing: Maximal assistance Where Assessed-Upper Body Bathing: Bed level Lower Body Bathing: Dependent Where Assessed-Lower Body Bathing: Bed level Upper Body Dressing: Maximal assistance Where Assessed-Upper Body Dressing: Bed level Lower Body Dressing: Dependent Where Assessed-Lower Body Dressing: Bed level Toileting: Dependent Where Assessed-Toileting: Bed level Toilet Transfer: Unable to assess Tub/Shower Transfer: Unable to assess ADL Comments: Max A grossly for UB BADLs and Total A grossly for LB BADLs at bed level in supported long/circle sitting. Vision   Perception    Praxis   Exercises:   Other Treatments:     Therapy/Group: Individual Therapy  Duayne Cal 09/22/2019, 12:10 PM

## 2019-09-22 NOTE — Progress Notes (Signed)
[place] PHYSICAL MEDICINE & REHABILITATION PROGRESS NOTE   Subjective/Complaints:   Pt up in bed feeding herself breakfast. Eating cereal with wide gripped spoon. No c/o  ROS: Patient denies fever, rash, sore throat, blurred vision, nausea, vomiting, diarrhea, cough, shortness of breath or chest pain, joint or back pain, headache, or mood change.      Objective:   No results found. No results for input(s): WBC, HGB, HCT, PLT in the last 72 hours. No results for input(s): NA, K, CL, CO2, GLUCOSE, BUN, CREATININE, CALCIUM in the last 72 hours.  Intake/Output Summary (Last 24 hours) at 09/22/2019 0842 Last data filed at 09/21/2019 1841 Gross per 24 hour  Intake 444 ml  Output --  Net 444 ml     Physical Exam: Vital Signs Blood pressure (!) 103/46, pulse 65, temperature 97.9 F (36.6 C), resp. rate 16, height 5\' 5"  (1.651 m), weight 61.2 kg, SpO2 93 %. Constitutional: No distress . Vital signs reviewed. HEENT: EOMI, oral membranes moist Neck: supple Cardiovascular: RRR without murmur. No JVD    Respiratory/Chest: CTA Bilaterally without wheezes or rales. Normal effort    GI/Abdomen: BS +, non-tender, non-distended Ext: no clubbing, cyanosis, or edema Psych: sl irritable Skin: < 2 cm- open- oval shaped- just the top layer gone, but very superficial - between buttocks- in crease- Stage II- not anywhere near Stage III per notes.  Neurologic: Tone mild hyperreflexia right lower extremity no clonus at the ankles bilaterally no evidence of flexor withdrawal Motor strength 4+ bilateral deltoid bicep 4 - bilateral tricep trace finger flexors bilaterally trace finger extensors bilaterally 0 hand intrinsics bilaterally RLE- 0 to tr/5 Musculoskeletal: No pain with active assisted as well as passive range of motion in the upper lower limb no joint swelling Sensation intact throughout    Assessment/Plan: 1. Functional deficits secondary to tetraparesis secondary to cervical  myelopathy which require 3+ hours per day of interdisciplinary therapy in a comprehensive inpatient rehab setting.  Physiatrist is providing close team supervision and 24 hour management of active medical problems listed below.  Physiatrist and rehab team continue to assess barriers to discharge/monitor patient progress toward functional and medical goals  Care Tool:  Bathing    Body parts bathed by patient: Right arm, Left arm, Chest, Abdomen, Front perineal area, Right upper leg, Left upper leg, Right lower leg, Face   Body parts bathed by helper: Buttocks, Left lower leg     Bathing assist Assist Level: Minimal Assistance - Patient > 75%     Upper Body Dressing/Undressing Upper body dressing   What is the patient wearing?: Pull over shirt    Upper body assist Assist Level: Supervision/Verbal cueing    Lower Body Dressing/Undressing Lower body dressing      What is the patient wearing?: Incontinence brief, Pants     Lower body assist Assist for lower body dressing: Maximal Assistance - Patient 25 - 49%     Toileting Toileting    Toileting assist Assist for toileting: Maximal Assistance - Patient 25 - 49%     Transfers Chair/bed transfer  Transfers assist     Chair/bed transfer assist level: 2 Helpers     Locomotion Ambulation   Ambulation assist   Ambulation activity did not occur: Safety/medical concerns          Walk 10 feet activity   Assist  Walk 10 feet activity did not occur: Safety/medical concerns        Walk 50 feet activity   Assist  Walk 50 feet with 2 turns activity did not occur: Safety/medical concerns         Walk 150 feet activity   Assist Walk 150 feet activity did not occur: Safety/medical concerns         Walk 10 feet on uneven surface  activity   Assist Walk 10 feet on uneven surfaces activity did not occur: Safety/medical concerns         Wheelchair     Assist Will patient use wheelchair at  discharge?: Yes Type of Wheelchair: Power    Wheelchair assist level: Supervision/Verbal cueing Max wheelchair distance: 200    Wheelchair 50 feet with 2 turns activity    Assist        Assist Level: Supervision/Verbal cueing   Wheelchair 150 feet activity     Assist      Assist Level: Supervision/Verbal cueing   Blood pressure (!) 103/46, pulse 65, temperature 97.9 F (36.6 C), resp. rate 16, height 5\' 5"  (1.651 m), weight 61.2 kg, SpO2 93 %.  Medical Problem List and Plan: 1.   Tetraparesis secondary to cervical myelopathy -patient may  shower -ELOS/Goals: 20 to 24 days  -Continue CIR 2. Antithrombotics: -DVT/anticoagulation:Mechanical:Sequential compression devices, below kneeBilateral lower extremities. Bilateral lower extremity DVTs, age indeterminate, on . Started on Eliquis.   8/9- needs at least 3 months -antiplatelet therapy: N/A 3. Pain Management:Well controlled with prn Tylenol 4. Mood:LCSW to follow for evaluation and support. -antipsychotic agents: N/A 5. Neuropsych: This patient is capable of making decisions on her own behalf. 6. Skin/Wound Care:Stage II sacral ulcer-Monitor wound for healing. Will add protein supplement and multivitamin to promote healing.  8/20- stage II sacral ulcer- looking stable  -continue turning and local care 7. Fluids/Electrolytes/Nutrition:Monitor I/O. Check lytes in am.  8. HTN: Monitor BP tid--continue HCTZ daily.Check BMET/K+ level in am.  8/20- Off HCTZ- DBP in 40s, but SBP well controlled  8/21 bp controlled 9. Post op labs: Pre-op K+ low normal.   8/20- K+ 3.5- labs q Monday 10 Neurogenic bladder: Has been incontinent with external catheter--will monitor voiding with PVRs/bladder scan. UA negative. Continue Toviaz 4mg  daily  8/14: Continues to have urgency, no dysuria. Has sensation of voiding.   8/20- still has 50% of time bladder incontinence-  will send to Neuro-Urology after d/c.  11.Neurogenic bowel: KUB shows large stool burden and no obstruction. Had BM with enema on admission. On daily Miralax, colace TID, Senna HS.  bisacodyl 10mg  supp qpm  8/20- reduced Miralax to daily to help with loose stools- still getting bowel program nightly with lidocaine  8/21 + results with program, stools still mushy--observe today  12. Insomnia: Trazodone 100mg  did not provide benefit Try Amitriptyline 25mg .   8/15: Has been helping but she slept poorly last night.  13. Leukocytosis: WBC decreased to 12.4. UA/UC negative.    8/12- resolved 14. Bradycardia  8/20-21- asymptomatic- running low 60s- con't to monitor 15. Nausea  8/16- give prns meds 16. Sleepiness  8/18- has been really sedated last 2 days- afebrile- doesn't have any new meds- no Sx's of UTI  8/19- resolved 17. Dispo  8/19- pt said daughters cannot care for her, but doesn't want to go to SNF- will discuss with pt/daughter this afternoon.   8/20- spoke with pt and daughter for additional 25 minutes- so total care 40 minutes today- as detailed above.      LOS: 19 days A FACE TO FACE EVALUATION WAS PERFORMED  12-22-1979 09/22/2019, 8:42 AM

## 2019-09-22 NOTE — Progress Notes (Signed)
Physical Therapy Session Note  Patient Details  Name: Haley Robinson MRN: [id number] Date of Birth: February 13, 1940  Today's Date: 09/22/2019 PT Individual Time: 0830-0935 PT Individual Time Calculation (min): 65 min   Short Term Goals: Week 1:  PT Short Term Goal 1 (Week 1): Pt will perform rolling with min A consistently PT Short Term Goal 1 - Progress (Week 1): Met PT Short Term Goal 2 (Week 1): Pt will perform bed mobility with assist x 1 consistently PT Short Term Goal 2 - Progress (Week 1): Progressing toward goal PT Short Term Goal 3 (Week 1): Pt will maintain static sitting balance x 5 min with mod A PT Short Term Goal 3 - Progress (Week 1): Met Week 2:  PT Short Term Goal 1 (Week 2): Pt will perform least restricive transfer with assist x 1 consistently PT Short Term Goal 1 - Progress (Week 2): Progressing toward goal PT Short Term Goal 2 (Week 2): Pt will perform w/c mobility x 50 ft with LRAD PT Short Term Goal 2 - Progress (Week 2): Met PT Short Term Goal 3 (Week 2): Pt will perform bed mobility with assist x 1 consistently PT Short Term Goal 3 - Progress (Week 2): Met Week 3:  PT Short Term Goal 1 (Week 3): Pt will demonstrate ability to perform pressure relief independently via [place] or request assist with pressure relief when in TIS PT Short Term Goal 2 (Week 3): Pt will maintain static sitting balance with close Supervision x 5 min PT Short Term Goal 3 (Week 3): Pt will perform least restrictive transfer with assist x 1  Skilled Therapeutic Interventions/Progress Updates:    pt received in bed and agreeable to therapy. Pt reported she had urinated and needed to be changed. PT assessed pt in rolling R and L x2 for brief removal and hygiene at min A for rolling and total A for hygiene. Pt directed in rolling x2 again for new brief placement and donning of pants in bed and B shoes, total A for clothing and min A for rolling. Nursing present for vitals and morning medication at this  time and present for the rest of time in room with therapy. Pt directed in supine>sit EOB max A for BLE management and trunk support into full sitting, min A for balance EOB, pt mod A for trunk lean to R for slide board placement, max A x2 for transfer to WC, max A for repositioning once in chair for trunk and hip alignment. Pt took medications sitting in WC, then directed in powerWC mobility to gym with min A for pt to complete distance for navigational corrections. Pt demonstrated difficulty controlling PWC this AM on indoor speed setting despite lowering speed at this setting. Once in gym, speed setting changed to slow and pt demonstrated improved ability to control PWC and make corrections as needed, multiple attempts with turns required however pt able to complete full turn x2, "parking" WC in small area of gym in reverse with visual demonstrate, increased time, and VC. Pt directed in seated, no armrests, BUE mobility for increased trunk support and stability 2x10:overhead reaches, alternating BUE overhead/lowering motion, and forward reaching with BUE grossly mod A initially with activities however pt improved to min A at R trunk to prevent LOB to R side and encourage anterior pelvic tilt without use of back support for activity. Pt directed in seated LLE strengthening exericse of 2x10 marching, hip abduction, hip adduction, and knee extension. Pt demonstrated limited ability to  clear chair seat with hip flexion however activatation and mobility noted.  Pt directed in [place] mobility to return to room, CGA with VC pt parked WC in room with VC, left in chair, pt tilted herself to comfortable level, alarm belt in place, All needs in reach and in good condition. Call light in hand.    Therapy Documentation Precautions:  Precautions Precautions: Fall, Cervical Precaution Booklet Issued: Yes (comment) Precaution Comments: reviewed precautions Required Braces or Orthoses: Cervical Brace Cervical Brace: Soft  collar (when OOB) Restrictions Weight Bearing Restrictions: No Vital Signs: Therapy Vitals Temp: 98 F (36.7 C) Temp Source: Oral Pulse Rate: 63 Resp: 15 BP: (!) 117/49 Patient Position (if appropriate): Sitting Oxygen Therapy SpO2: 98 % O2 Device: Room Air Pain: Pain Assessment Pain Scale: 0-10 Pain Score: 0-No pain    Therapy/Group: Individual Therapy  Junie Panning 09/22/2019, 10:39 AM

## 2019-09-23 ENCOUNTER — Encounter (HOSPITAL_COMMUNITY): Payer: Medicare PPO | Admitting: Occupational Therapy

## 2019-09-23 NOTE — Progress Notes (Signed)
Occupational Therapy Session Note  Patient Details  Name: Haley Robinson MRN: [id number] Date of Birth: [birth year]  Today's Date: 09/23/2019 OT Group Time: 1100-1200 OT Group Time Calculation (min): 60 min  Skilled Therapeutic Interventions/Progress Updates:    Pt engaged in therapeutic w/c level dance group focusing on patient choice, UE/LE strengthening, salience, activity tolerance, and social participation. Pt was guided through various dance-based exercises involving UEs/LEs and trunk. All music was selected by group members. Emphasis placed on NMR of the extremities, core strengthening, and activity tolerance today. Min HOH for active assist to incorporate both LEs during straight leg raises in beat to music, TIS placed in neutral position during unsupported sitting exercise, pt throughout session very motivated to participate. At end of session she was reclined in TIS and left in room with all needs within reach, safety belt fastened, and dtr present.    Therapy Documentation Precautions:  Precautions Precautions: Fall, Cervical Precaution Booklet Issued: Yes (comment) Precaution Comments: reviewed precautions Required Braces or Orthoses: Cervical Brace Cervical Brace: Soft collar (when OOB) Restrictions Weight Bearing Restrictions: No Pain: in the Rt LE, pt reports just during certain movements, exercises modified for pain mgt   ADL: ADL Upper Body Bathing: Maximal assistance Where Assessed-Upper Body Bathing: Bed level Lower Body Bathing: Dependent Where Assessed-Lower Body Bathing: Bed level Upper Body Dressing: Maximal assistance Where Assessed-Upper Body Dressing: Bed level Lower Body Dressing: Dependent Where Assessed-Lower Body Dressing: Bed level Toileting: Dependent Where Assessed-Toileting: Bed level Toilet Transfer: Unable to assess Tub/Shower Transfer: Unable to assess ADL Comments: Max A grossly for UB BADLs and Total A grossly for LB BADLs at bed level in  supported long/circle sitting.     Therapy/Group: Group Therapy  Riley Papin A Evony Rezek 09/23/2019, 12:46 PM

## 2019-09-23 NOTE — Progress Notes (Signed)
[facility] PHYSICAL MEDICINE & REHABILITATION PROGRESS NOTE   Subjective/Complaints:  Fell asleep while eating breakfast. C/o sore spot on right heel after "getting caught in w/c"  ROS: Patient denies fever, rash, sore throat, blurred vision, nausea, vomiting, diarrhea, cough, shortness of breath or chest pain,  headache, or mood change.     Objective:   No results found. No results for input(s): WBC, HGB, HCT, PLT in the last 72 hours. No results for input(s): NA, K, CL, CO2, GLUCOSE, BUN, CREATININE, CALCIUM in the last 72 hours.  Intake/Output Summary (Last 24 hours) at 09/23/2019 0839 Last data filed at 09/23/2019 5093 Gross per 24 hour  Intake 240 ml  Output --  Net 240 ml     Physical Exam: Vital Signs Blood pressure (!) 112/51, pulse 65, temperature 98.1 F (36.7 C), resp. rate 18, height 5\' 5"  (1.651 m), weight 61.2 kg, SpO2 94 %. Constitutional: No distress . Vital signs reviewed. HEENT: EOMI, oral membranes moist Neck: supple Cardiovascular: RRR without murmur. No JVD    Respiratory/Chest: CTA Bilaterally without wheezes or rales. Normal effort    GI/Abdomen: BS +, non-tender, non-distended Ext: no clubbing, cyanosis, or edema Psych: pleasant and cooperative Skin: < 2 cm- open- oval shaped- just the top layer gone, but very superficial - between buttocks- in crease- Stage II. No breakdown on heels, perhaps a little red at best..  Neurologic: Tone mild hyperreflexia right lower extremity no clonus at the ankles bilaterally no evidence of flexor withdrawal Motor strength 4+ bilateral deltoid bicep 4 - bilateral tricep trace finger flexors bilaterally trace finger extensors bilaterally 0 hand intrinsics bilaterally RLE- 0 to tr/5 Musculoskeletal: No pain with active assisted as well as passive range of motion in the upper lower limb no joint swelling. Right heel a little sore, skin intact as above Sensation intact throughout    Assessment/Plan: 1. Functional  deficits secondary to tetraparesis secondary to cervical myelopathy which require 3+ hours per day of interdisciplinary therapy in a comprehensive inpatient rehab setting.  Physiatrist is providing close team supervision and 24 hour management of active medical problems listed below.  Physiatrist and rehab team continue to assess barriers to discharge/monitor patient progress toward functional and medical goals  Care Tool:  Bathing    Body parts bathed by patient: Right arm, Left arm, Chest, Abdomen, Front perineal area, Right upper leg, Left upper leg, Right lower leg, Face   Body parts bathed by helper: Buttocks, Left lower leg     Bathing assist Assist Level: Minimal Assistance - Patient > 75%     Upper Body Dressing/Undressing Upper body dressing   What is the patient wearing?: Pull over shirt    Upper body assist Assist Level: Supervision/Verbal cueing    Lower Body Dressing/Undressing Lower body dressing      What is the patient wearing?: Incontinence brief, Pants     Lower body assist Assist for lower body dressing: Maximal Assistance - Patient 25 - 49%     Toileting Toileting    Toileting assist Assist for toileting: Maximal Assistance - Patient 25 - 49%     Transfers Chair/bed transfer  Transfers assist     Chair/bed transfer assist level: 2 Helpers     Locomotion Ambulation   Ambulation assist   Ambulation activity did not occur: Safety/medical concerns          Walk 10 feet activity   Assist  Walk 10 feet activity did not occur: Safety/medical concerns  Walk 50 feet activity   Assist Walk 50 feet with 2 turns activity did not occur: Safety/medical concerns         Walk 150 feet activity   Assist Walk 150 feet activity did not occur: Safety/medical concerns         Walk 10 feet on uneven surface  activity   Assist Walk 10 feet on uneven surfaces activity did not occur: Safety/medical concerns          Wheelchair     Assist Will patient use wheelchair at discharge?: Yes Type of Wheelchair: Power    Wheelchair assist level: Supervision/Verbal cueing Max wheelchair distance: 200    Wheelchair 50 feet with 2 turns activity    Assist        Assist Level: Supervision/Verbal cueing   Wheelchair 150 feet activity     Assist      Assist Level: Supervision/Verbal cueing   Blood pressure (!) 112/51, pulse 65, temperature 98.1 F (36.7 C), resp. rate 18, height 5\' 5"  (1.651 m), weight 61.2 kg, SpO2 94 %.  Medical Problem List and Plan: 1.   Tetraparesis secondary to cervical myelopathy -patient may  shower -ELOS/Goals: 20 to 24 days  -Continue CIR 2. Antithrombotics: -DVT/anticoagulation:Mechanical:Sequential compression devices, below kneeBilateral lower extremities. Bilateral lower extremity DVTs, age indeterminate, on . Started on Eliquis.   8/9- needs at least 3 months -antiplatelet therapy: N/A 3. Pain Management:Well controlled with prn Tylenol 4. Mood:LCSW to follow for evaluation and support. -antipsychotic agents: N/A 5. Neuropsych: This patient is capable of making decisions on her own behalf. 6. Skin/Wound Care:Stage II sacral ulcer-Monitor wound for healing. Will add protein supplement and multivitamin to promote healing.  8/20- stage II sacral ulcer- looking stable  -continue turning and local care  -PRAFO's for heels 7. Fluids/Electrolytes/Nutrition:Monitor I/O. Check lytes in am.  8. HTN: Monitor BP tid--continue HCTZ daily.Check BMET/K+ level in am.  8/20- Off HCTZ- DBP in 40s, but SBP well controlled  8/22 bp controlled 9. Post op labs: Pre-op K+ low normal.   8/20- K+ 3.5- labs q Monday 10 Neurogenic bladder: Has been incontinent with external catheter--will monitor voiding with PVRs/bladder scan. UA negative. Continue Toviaz 4mg  daily  8/14: Continues to have urgency, no  dysuria. Has sensation of voiding.   8/20- still has 50% of time bladder incontinence- will send to Neuro-Urology after d/c.  11.Neurogenic bowel: KUB shows large stool burden and no obstruction. Had BM with enema on admission. On daily Miralax, colace TID, Senna HS.  bisacodyl 10mg  supp qpm  8/20- reduced Miralax to daily to help with loose stools- still getting bowel program nightly with lidocaine  8/22 stool still quite mushy. Hold colace for now.  12. Insomnia: Trazodone 100mg  did not provide benefit Try Amitriptyline 25mg .   8/15: Has been helping but she slept poorly last night.  13. Leukocytosis: WBC decreased to 12.4. UA/UC negative.    8/12- resolved 14. Bradycardia  8/20-21- asymptomatic- running low 60s- con't to monitor 15. Nausea  8/16- give prns meds 16. Sleepiness  8/18- has been really sedated last 2 days- afebrile- doesn't have any new meds- no Sx's of UTI  8/19-21 appears generally improved 17. Dispo  8/19- pt said daughters cannot care for her, but doesn't want to go to SNF- will discuss with pt/daughter this afternoon.   8/20- spoke with pt and daughter for additional 25 minutes- so total care 40 minutes today- as detailed above.      LOS: 20 days A  FACE TO FACE EVALUATION WAS PERFORMED  Ranelle Oyster 09/23/2019, 8:39 AM

## 2019-09-24 ENCOUNTER — Inpatient Hospital Stay (HOSPITAL_COMMUNITY): Payer: Medicare PPO | Admitting: Occupational Therapy

## 2019-09-24 ENCOUNTER — Inpatient Hospital Stay (HOSPITAL_COMMUNITY): Payer: Medicare PPO | Admitting: Physical Therapy

## 2019-09-24 ENCOUNTER — Inpatient Hospital Stay (HOSPITAL_COMMUNITY): Payer: Medicare PPO

## 2019-09-24 LAB — CBC
HCT: 32.5 % — ABNORMAL LOW (ref 36.0–46.0)
Hemoglobin: 10.5 g/dL — ABNORMAL LOW (ref 12.0–15.0)
MCH: 31.9 pg (ref 26.0–34.0)
MCHC: 32.3 g/dL (ref 30.0–36.0)
MCV: 98.8 fL (ref 80.0–100.0)
Platelets: 272 10*3/uL (ref 150–400)
RBC: 3.29 MIL/uL — ABNORMAL LOW (ref 3.87–5.11)
RDW: 13.2 % (ref 11.5–15.5)
WBC: 7.1 10*3/uL (ref 4.0–10.5)
nRBC: 0 % (ref 0.0–0.2)

## 2019-09-24 LAB — BASIC METABOLIC PANEL
Anion gap: 9 (ref 5–15)
BUN: 12 mg/dL (ref 8–23)
CO2: 28 mmol/L (ref 22–32)
Calcium: 8.8 mg/dL — ABNORMAL LOW (ref 8.9–10.3)
Chloride: 103 mmol/L (ref 98–111)
Creatinine, Ser: 0.82 mg/dL (ref 0.44–1.00)
GFR calc Af Amer: >60 mL/min (ref >60)
GFR calc non Af Amer: >60 mL/min (ref >60)
Glucose, Bld: 101 mg/dL — ABNORMAL HIGH (ref 70–99)
Potassium: 3.7 mmol/L (ref 3.5–5.1)
Sodium: 140 mmol/L (ref 135–145)

## 2019-09-24 NOTE — Progress Notes (Signed)
Pt had a medium sized brown stool post bowel protocol

## 2019-09-24 NOTE — Plan of Care (Signed)
  Problem: Consults Goal: RH SPINAL CORD INJURY PATIENT EDUCATION Description:  See Patient Education module for education specifics.  Outcome: Progressing Goal: Skin Care Protocol Initiated - if Braden Score 18 or less Description: If consults are not indicated, leave blank or document N/A Outcome: Progressing   Problem: SCI BOWEL ELIMINATION Goal: RH STG MANAGE BOWEL WITH ASSISTANCE Description: STG Manage Bowel with mod Assistance. Outcome: Progressing Goal: RH STG SCI MANAGE BOWEL WITH MEDICATION WITH ASSISTANCE Description: STG SCI Manage bowel with medication with min/mod assistance. Outcome: Progressing   Problem: SCI BLADDER ELIMINATION Goal: RH STG MANAGE BLADDER WITH ASSISTANCE Description: STG Manage Bladder With mod Assistance Outcome: Progressing   Problem: RH SKIN INTEGRITY Goal: RH STG SKIN FREE OF INFECTION/BREAKDOWN Description: Pt will be free of skin breakdown/infection prior to DC with mod/max assist Outcome: Progressing Goal: RH STG MAINTAIN SKIN INTEGRITY WITH ASSISTANCE Description: STG Maintain Skin Integrity With mod/max Assistance. Outcome: Progressing   Problem: RH SAFETY Goal: RH STG ADHERE TO SAFETY PRECAUTIONS W/ASSISTANCE/DEVICE Description: STG Adhere to Safety Precautions With cues and reminders Assistance/Device. Outcome: Progressing   

## 2019-09-24 NOTE — Progress Notes (Signed)
Dig stim performed and suppository inserted, patient tolerated well.

## 2019-09-24 NOTE — Progress Notes (Signed)
[place] PHYSICAL MEDICINE & REHABILITATION PROGRESS NOTE   Subjective/Complaints:  Pt reports doing suppository/bowel program- Don't see documented last night- pt says went well- lidocaine helpful.   Fewer loose stools with decrease in miralax   ROS:  Pt denies SOB, abd pain, CP, N/V/C/D, and vision changes   Objective:   No results found. Recent Labs    09/24/19 0429  WBC 7.1  HGB 10.5*  HCT 32.5*  PLT 272   Recent Labs    09/24/19 0429  NA 140  K 3.7  CL 103  CO2 28  GLUCOSE 101*  BUN 12  CREATININE 0.82  CALCIUM 8.8*    Intake/Output Summary (Last 24 hours) at 09/24/2019 0846 Last data filed at 09/24/2019 0800 Gross per 24 hour  Intake 580 ml  Output 500 ml  Net 80 ml     Physical Exam: Vital Signs Blood pressure (!) 115/40, pulse 61, temperature 98.3 F (36.8 C), resp. rate 18, height 5\' 5"  (1.651 m), weight 62.9 kg, SpO2 93 %. Constitutional: sitting up- set up for breakfast by NT, appropriate, NAD HEENT: EOMI, oral membranes moist Neck: supple Cardiovascular: RRR Respiratory/Chest: CTA B/L- no W/R/R- good air movement   GI/Abdomen: Soft, NT, ND, (+)BS  Ext: no clubbing, cyanosis, or edema Psych: flat affect Skin: < 2 cm- open- oval shaped- just the top layer gone, but very superficial - between buttocks- in crease- Stage II. No breakdown on heels, perhaps a little red at best..  Neurologic: Tone mild hyperreflexia right lower extremity no clonus at the ankles bilaterally no evidence of flexor withdrawal Motor strength 4+ bilateral deltoid bicep 4 - bilateral tricep trace finger flexors bilaterally trace finger extensors bilaterally 0 hand intrinsics bilaterally RLE- 0 to tr/5 Musculoskeletal: No pain with active assisted as well as passive range of motion in the upper lower limb no joint swelling. Right heel a little sore, skin intact as above Sensation intact throughout    Assessment/Plan: 1. Functional deficits secondary to tetraparesis  secondary to cervical myelopathy which require 3+ hours per day of interdisciplinary therapy in a comprehensive inpatient rehab setting.  Physiatrist is providing close team supervision and 24 hour management of active medical problems listed below.  Physiatrist and rehab team continue to assess barriers to discharge/monitor patient progress toward functional and medical goals  Care Tool:  Bathing    Body parts bathed by patient: Right arm, Left arm, Chest, Abdomen, Front perineal area, Right upper leg, Left upper leg, Right lower leg, Face   Body parts bathed by helper: Buttocks, Left lower leg     Bathing assist Assist Level: Minimal Assistance - Patient > 75%     Upper Body Dressing/Undressing Upper body dressing   What is the patient wearing?: Pull over shirt    Upper body assist Assist Level: Supervision/Verbal cueing    Lower Body Dressing/Undressing Lower body dressing      What is the patient wearing?: Incontinence brief, Pants     Lower body assist Assist for lower body dressing: Maximal Assistance - Patient 25 - 49%     Toileting Toileting    Toileting assist Assist for toileting: Maximal Assistance - Patient 25 - 49%     Transfers Chair/bed transfer  Transfers assist     Chair/bed transfer assist level: 2 Helpers     Locomotion Ambulation   Ambulation assist   Ambulation activity did not occur: Safety/medical concerns          Walk 10 feet activity  Assist  Walk 10 feet activity did not occur: Safety/medical concerns        Walk 50 feet activity   Assist Walk 50 feet with 2 turns activity did not occur: Safety/medical concerns         Walk 150 feet activity   Assist Walk 150 feet activity did not occur: Safety/medical concerns         Walk 10 feet on uneven surface  activity   Assist Walk 10 feet on uneven surfaces activity did not occur: Safety/medical concerns         Wheelchair     Assist Will  patient use wheelchair at discharge?: Yes Type of Wheelchair: Power    Wheelchair assist level: Supervision/Verbal cueing Max wheelchair distance: 200    Wheelchair 50 feet with 2 turns activity    Assist        Assist Level: Supervision/Verbal cueing   Wheelchair 150 feet activity     Assist      Assist Level: Supervision/Verbal cueing   Blood pressure (!) 115/40, pulse 61, temperature 98.3 F (36.8 C), resp. rate 18, height 5\' 5"  (1.651 m), weight 62.9 kg, SpO2 93 %.  Medical Problem List and Plan: 1.   Tetraparesis secondary to cervical myelopathy -patient may  shower -ELOS/Goals: 20 to 24 days  -Continue CIR 2. Antithrombotics: -DVT/anticoagulation:Mechanical:Sequential compression devices, below kneeBilateral lower extremities. Bilateral lower extremity DVTs, age indeterminate, on . Started on Eliquis.   8/9- needs at least 3 months -antiplatelet therapy: N/A 3. Pain Management:Well controlled with prn Tylenol 4. Mood:LCSW to follow for evaluation and support. -antipsychotic agents: N/A 5. Neuropsych: This patient is capable of making decisions on her own behalf. 6. Skin/Wound Care:Stage II sacral ulcer-Monitor wound for healing. Will add protein supplement and multivitamin to promote healing.  8/20- stage II sacral ulcer- looking stable  -continue turning and local care  -PRAFO's for heels 7. Fluids/Electrolytes/Nutrition:Monitor I/O. Check lytes in am.  8. HTN: Monitor BP tid--continue HCTZ daily.Check BMET/K+ level in am.  8/20- Off HCTZ- DBP in 40s, but SBP well controlled  8/22 bp controlled 9. Post op labs: Pre-op K+ low normal.   8/20- K+ 3.5- labs q Monday  8/23- K+ 3.7- doing better 10 Neurogenic bladder: Has been incontinent with external catheter--will monitor voiding with PVRs/bladder scan. UA negative. Continue Toviaz 4mg  daily  8/14: Continues to have urgency, no dysuria. Has  sensation of voiding.   8/20- still has 50% of time bladder incontinence- will send to Neuro-Urology after d/c.  11.Neurogenic bowel: KUB shows large stool burden and no obstruction. Had BM with enema on admission. On daily Miralax, colace TID, Senna HS.  bisacodyl 10mg  supp qpm  8/20- reduced Miralax to daily to help with loose stools- still getting bowel program nightly with lidocaine  8/22 stool still quite mushy. Hold colace for now. 8/23- doing better with reduction in colace/miralax per pt 12. Insomnia: Trazodone 100mg  did not provide benefit Try Amitriptyline 25mg .   8/15: Has been helping but she slept poorly last night.  13. Leukocytosis: WBC decreased to 12.4. UA/UC negative.    8/12- resolved 14. Bradycardia  8/20-21- asymptomatic- running low 60s- con't to monitor 15. Nausea  8/16- give prns meds 16. Sleepiness  8/18- has been really sedated last 2 days- afebrile- doesn't have any new meds- no Sx's of UTI  8/19-21 appears generally improved 17. Dispo  8/19- pt said daughters cannot care for her, but doesn't want to go to SNF- will discuss with pt/daughter  this afternoon.   8/20- spoke with pt and daughter for additional 25 minutes- so total care 40 minutes today- as detailed above.      LOS: 21 days A FACE TO FACE EVALUATION WAS PERFORMED  Naelle Diegel 09/24/2019, 8:46 AM

## 2019-09-24 NOTE — Progress Notes (Signed)
Physical Therapy Session Note  Patient Details  Name: Haley Robinson MRN: [id number] Date of Birth: [birth year]  Today's Date: 09/24/2019 PT Individual Time: 1115-1201 and 4098 -1191 PT Individual Time Calculation (min): 46 min and 71 mins  Short Term Goals: Week 1:  PT Short Term Goal 1 (Week 1): Pt will perform rolling with min A consistently PT Short Term Goal 1 - Progress (Week 1): Met PT Short Term Goal 2 (Week 1): Pt will perform bed mobility with assist x 1 consistently PT Short Term Goal 2 - Progress (Week 1): Progressing toward goal PT Short Term Goal 3 (Week 1): Pt will maintain static sitting balance x 5 min with mod A PT Short Term Goal 3 - Progress (Week 1): Met Week 2:  PT Short Term Goal 1 (Week 2): Pt will perform least restricive transfer with assist x 1 consistently PT Short Term Goal 1 - Progress (Week 2): Progressing toward goal PT Short Term Goal 2 (Week 2): Pt will perform w/c mobility x 50 ft with LRAD PT Short Term Goal 2 - Progress (Week 2): Met PT Short Term Goal 3 (Week 2): Pt will perform bed mobility with assist x 1 consistently PT Short Term Goal 3 - Progress (Week 2): Met Week 3:  PT Short Term Goal 1 (Week 3): Pt will demonstrate ability to perform pressure relief independently via [place] or request assist with pressure relief when in TIS PT Short Term Goal 2 (Week 3): Pt will maintain static sitting balance with close Supervision x 5 min PT Short Term Goal 3 (Week 3): Pt will perform least restrictive transfer with assist x 1  Skilled Therapeutic Interventions/Progress Updates:    SESSION 1: pt received in [place] and agreeable to therapy. Pt directed in [place] mobility to gym at supervision with pt able to control chair, with only VC for technique, 150'. Pt directed in x6 slide board transfers to/from [facility] to/from mat table initially mod A for slide and total for board placement, improved to min A to chair on pt's R with extra time to complete overall with VC for  technique for weight shifting. Pt fatigued and required mod A on final rep but improved technique overall, mod A for repositioning at midline and farther back into chair. At [facility], pt directed in seated trunk strengthening for improved balance and mobility with increased safety in WC. Pt directed in 3x10 R and L trunk leans to elbow laterally on mat min A intermittently from L and CGA from R, 2x10 forward reaches with BUE one instance of min A to right pt to midline rest pt able to self correct with VC, x10 cross midline reaches to R and L alternating directions at min A for stability and righting. Pt returned to room in [place], min A for turning around in room a smaller area however straight paths supervision, 200'. Pt left in [place], alarm belt set, All needs in reach and in good condition. Call light in hand.    SESSION 2: pt received in [place] and agreeable to therapy. Pt directed in [place] mobility 500' in hallways and then off of unit outside, nursing staff notified prior to leaving unit and agreeable. Pt directed in mobility outside on various surfaces and in more crowded environment, mod A for turning chair around for entry into elevator however other than this supervision with VC. Once outside at tables, pt requested to rest, extra time taken with pt having many questions about equipment needs and DC recommendations, pt  educated on current PT recommendations and pt is open to [facility] eval this week however has many questions for vendor and wants family present to assist at this appointment if possible. Pt reported to floor and room with min A for elevator technique, grossly supervision for rest of mobility, returned to room, directed in seated BLE stretching for hamstrings, hip flexors, hip adductors, hip abductors, and calves with pt reporting she felt better post this. Pt also directed in 2x10 LLE strengthening ex of marching, LAQ, and ankle pumps. Pt left in chair, alarm belt set, All needs in reach and in good  condition. Call light in hand.  Therapy Documentation Precautions:  Precautions Precautions: Fall, Cervical Precaution Booklet Issued: Yes (comment) Precaution Comments: reviewed precautions Required Braces or Orthoses: Cervical Brace Cervical Brace: Soft collar (when OOB) Restrictions Weight Bearing Restrictions: No    Therapy/Group: Individual Therapy  Junie Panning 09/24/2019, 2:40 PM

## 2019-09-24 NOTE — Progress Notes (Signed)
Occupational Therapy Weekly Progress Note  Patient Details  Name: Haley Robinson MRN: [id number] Date of Birth: Aug 12, 1940  Beginning of progress report period: September 17, 2019 End of progress report period: September 24, 2019  Today's Date: 09/24/2019 OT Individual Time: 0847-1000 OT Individual Time Calculation (min): 73 min    Patient has met 1 of 3 short term goals. Patient continues to be motivated to participate with therapy and is making slow progress toward goals. Patient currently requires Min A at best and Mod A at most for dynamic sitting balance at EOB or EOM. Max A +2 required for SB transfers. Patient is able to complete LB bathing at bed level in supported circle sitting with Max A while rolling R<>L with Min A and use of bedrails. Seated at sink level in TIS wc, patient can perform UB bathing/dressing and grooming tasks with set-up assist. Patient demonstrates continued incontinence of bowel and bladder therefore no toile transfers have occurred.   Patient continues to demonstrate the following deficits: muscle weakness and muscle paralysis and impaired timing and sequencing, abnormal tone, unbalanced muscle activation, ataxia, decreased coordination and decreased motor planning and therefore will continue to benefit from skilled OT intervention to enhance overall performance with BADL.  Patient progressing toward long term goals..  Continue plan of care.  OT Short Term Goals Week 3:  OT Short Term Goal 1 (Week 3): Patient will demonstrate squat-pivot transfers with Max A. OT Short Term Goal 1 - Progress (Week 3): Progressing toward goal OT Short Term Goal 2 (Week 3): Pt will maintain dynamic sitting balance EOB/EOM with mod A during functional activities OT Short Term Goal 2 - Progress (Week 3): Met OT Short Term Goal 3 (Week 3): Pt will complete toilting tasks with mod A OT Short Term Goal 3 - Progress (Week 3): Progressing toward goal Week 4:  OT Short Term Goal 1 (Week 4):  Patient will demonstrate squat-pivot transfers with Max A OT Short Term Goal 2 (Week 4): Pt will maintain dynamic sitting balance EOB/EOM with Min A during functional activities OT Short Term Goal 3 (Week 4): Pt will complete toilting tasks with mod A  Skilled Therapeutic Interventions/Progress Updates:  Patient met lying supine in bed in agreement with OT treatment session with focus on self-care re-education, bed mobility, and functional transfers as detailed below. LB bathing/dressing in supine/lsupported long sitting with Max A. Patient completed supine to sidelying with Min A and sidelying to EOB with Max A. SB transfer to [facility] on L with Max A +2. Patient engaged in UB bathing/dressing task in supported sitting at sink level with set-up A and unilateral UE support on sink surface to maintain supported sitting balance in TIS wc. 3/3 grooming tasks with set-up A and use of compensatory strategies for opening ADL containers. Session concluded with patient seated in [place] with soft call bell within reach, belt alarm activated, and all needs met.   Therapy Documentation Precautions:  Precautions Precautions: Fall, Cervical Precaution Booklet Issued: Yes (comment) Precaution Comments: reviewed precautions Required Braces or Orthoses: Cervical Brace Cervical Brace: Soft collar (when OOB) Restrictions Weight Bearing Restrictions: No General:    Therapy/Group: Individual Therapy  Haley Robinson 09/24/2019, 7:27 AM

## 2019-09-25 ENCOUNTER — Inpatient Hospital Stay (HOSPITAL_COMMUNITY): Payer: Medicare PPO | Admitting: Physical Therapy

## 2019-09-25 ENCOUNTER — Inpatient Hospital Stay (HOSPITAL_COMMUNITY): Payer: Medicare PPO | Admitting: Occupational Therapy

## 2019-09-25 NOTE — Progress Notes (Signed)
At 1820 bowel program started. dig stim done and suppository inserted. Rectal vault was clear. No results at this time. Handed off to night RN.

## 2019-09-25 NOTE — Consult Note (Signed)
WOC Nurse Consult Note: Patient receiving care in [facility] [phone].   Reason for Consult: stage 2 ulcer.  This area appears to be related to moisture from incontinence and normal body gluteal fold moisture. Wound type: MASD from IAD and ITD in the gluteal fold at the coccyx Pressure Injury POA: Yes/No/NA Measurement: Area of breakdown measures 3 cm x 0.8 cm and no measureable depth Wound bed: pink Drainage (amount, consistency, odor) none Periwound: macerated Dressing procedure/placement/frequency:  Wash coccyx/gluteal fold area with soap and water. Pat dry.  Cut a sliver of Aquacel [place] [phone]) and tuck it into the gluteal fold and over the coccyx wound. Place a foam dressing over this. Change daily and prn soilage, or if Aquacel becomes "wet" in less that 24 hours. Monitor the wound area(s) for worsening of condition such as: Signs/symptoms of infection,  Increase in size,  Development of or worsening of odor, Development of pain, or increased pain at the affected locations.  Notify the medical team if any of these develop.  Thank you for the consult.  Discussed plan of care with the patient and bedside nurse.  WOC nurse will not follow at this time.  Please re-consult the WOC team if needed.  Helmut Muster, RN, MSN, CWOCN, CNS-BC, pager [phone]

## 2019-09-25 NOTE — Progress Notes (Signed)
Physical Therapy Weekly Progress Note  Patient Details  Name: Haley Robinson MRN: [id number] Date of Birth: 1941-01-17  Beginning of progress report period: September 18, 2019 End of progress report period: September 26, 2019  Today's Date: 09/26/2019    Patient has met 1 of 3 short term goals.  Patient continues to make very slow progress towards rehab goals. She is currently at min A for rolling, max A for supine to/from sit, is able to maintain static sitting balance for up to 5 min with close Supervision with use of BUE for support, and is dependent for transfers either via use of slide board (assist x 2) or a mechanical lift device. Pt is at Supervision level for power wheelchair mobility up to 200 ft and is making progress with learning safe power wheelchair navigation as well as is independent with performing pressure relief via use of tilt and recline feature of PWC.  Patient continues to demonstrate the following deficits muscle weakness, muscle joint tightness and muscle paralysis, decreased cardiorespiratoy endurance, abnormal tone, unbalanced muscle activation and decreased coordination and decreased sitting balance, decreased postural control and decreased balance strategies and therefore will continue to benefit from skilled PT intervention to increase functional independence with mobility.  Patient progressing toward long term goals..  Continue plan of care.  PT Short Term Goals Week 3:  PT Short Term Goal 1 (Week 3): Pt will demonstrate ability to perform pressure relief independently via [place] or request assist with pressure relief when in TIS PT Short Term Goal 1 - Progress (Week 3): Progressing toward goal PT Short Term Goal 2 (Week 3): Pt will maintain static sitting balance with close Supervision x 5 min PT Short Term Goal 2 - Progress (Week 3): Met PT Short Term Goal 3 (Week 3): Pt will perform least restrictive transfer with assist x 1 PT Short Term Goal 3 - Progress (Week 3):  Progressing toward goal Week 4:  PT Short Term Goal 1 (Week 4): =LTG due to ELOS  Therapy Documentation Precautions:  Precautions Precautions: Fall, Cervical Precaution Booklet Issued: Yes (comment) Precaution Comments: reviewed precautions Required Braces or Orthoses: Cervical Brace Cervical Brace: Soft collar (when OOB) Restrictions Weight Bearing Restrictions: No   Therapy/Group: Individual Therapy   Excell Seltzer, PT, DPT  09/25/2019, 3:41 PM

## 2019-09-25 NOTE — Progress Notes (Signed)
[facility] PHYSICAL MEDICINE & REHABILITATION PROGRESS NOTE   Subjective/Complaints:  Spasms occ esp in RLE- not bothersome enough to wants meds for it- but appeared painful.   Had dizzy episode last night again- had dx of vertigo for years.  But usually has vertigo episode 1-2x/day, sometimes more.   Bowel program OK- had bowel program- per notes, had medium BM AFTER bowel program.   ROS:   Pt denies SOB, abd pain, CP, N/V/C/D, and vision changes   Objective:   No results found. Recent Labs    09/24/19 0429  WBC 7.1  HGB 10.5*  HCT 32.5*  PLT 272   Recent Labs    09/24/19 0429  NA 140  K 3.7  CL 103  CO2 28  GLUCOSE 101*  BUN 12  CREATININE 0.82  CALCIUM 8.8*    Intake/Output Summary (Last 24 hours) at 09/25/2019 0837 Last data filed at 09/24/2019 1854 Gross per 24 hour  Intake 460 ml  Output --  Net 460 ml     Physical Exam: Vital Signs Blood pressure (!) 116/50, pulse 62, temperature 98.2 F (36.8 C), resp. rate 16, height 5\' 5"  (1.651 m), weight 62.9 kg, SpO2 95 %. Constitutional: laying on side- sad affect, appropriate/cordial, NAD HEENT: EOMI, oral membranes moist Neck: supple Cardiovascular: RRR Respiratory/Chest: CTA B/L- no W/R/R- good air movement GI/Abdomen: Soft, NT, ND, (+)BS  Ext: no clubbing, cyanosis, or edema Psych: flat affect but cordial Skin: < 2 cm- open- oval shaped- just the top layer gone, but very superficial - between buttocks- in crease- Stage II. No breakdown on heels, perhaps a little red at best..  Neurologic: spasms seen, RLE>LLE- was causing pt pain RLE- 0 to tr/5 Musculoskeletal: No pain with active assisted as well as passive range of motion in the upper lower limb no joint swelling. Right heel a little sore, skin intact as above Sensation intact throughout    Assessment/Plan: 1. Functional deficits secondary to tetraparesis secondary to cervical myelopathy which require 3+ hours per day of interdisciplinary therapy  in a comprehensive inpatient rehab setting.  Physiatrist is providing close team supervision and 24 hour management of active medical problems listed below.  Physiatrist and rehab team continue to assess barriers to discharge/monitor patient progress toward functional and medical goals  Care Tool:  Bathing    Body parts bathed by patient: Right arm, Left arm, Chest, Abdomen, Front perineal area, Right upper leg, Left upper leg, Right lower leg, Face   Body parts bathed by helper: Buttocks, Left lower leg, Right lower leg     Bathing assist Assist Level: Moderate Assistance - Patient 50 - 74%     Upper Body Dressing/Undressing Upper body dressing   What is the patient wearing?: Pull over shirt    Upper body assist Assist Level: Supervision/Verbal cueing    Lower Body Dressing/Undressing Lower body dressing      What is the patient wearing?: Incontinence brief, Pants     Lower body assist Assist for lower body dressing: Maximal Assistance - Patient 25 - 49%     Toileting Toileting    Toileting assist Assist for toileting: Maximal Assistance - Patient 25 - 49%     Transfers Chair/bed transfer  Transfers assist     Chair/bed transfer assist level: 2 Helpers     Locomotion Ambulation   Ambulation assist   Ambulation activity did not occur: Safety/medical concerns          Walk 10 feet activity   Assist  Walk 10 feet activity did not occur: Safety/medical concerns        Walk 50 feet activity   Assist Walk 50 feet with 2 turns activity did not occur: Safety/medical concerns         Walk 150 feet activity   Assist Walk 150 feet activity did not occur: Safety/medical concerns         Walk 10 feet on uneven surface  activity   Assist Walk 10 feet on uneven surfaces activity did not occur: Safety/medical concerns         Wheelchair     Assist Will patient use wheelchair at discharge?: Yes Type of Wheelchair: Power     Wheelchair assist level: Supervision/Verbal cueing Max wheelchair distance: 200    Wheelchair 50 feet with 2 turns activity    Assist        Assist Level: Supervision/Verbal cueing   Wheelchair 150 feet activity     Assist      Assist Level: Supervision/Verbal cueing   Blood pressure (!) 116/50, pulse 62, temperature 98.2 F (36.8 C), resp. rate 16, height 5\' 5"  (1.651 m), weight 62.9 kg, SpO2 95 %.  Medical Problem List and Plan: 1.   Tetraparesis secondary to cervical myelopathy -patient may  shower -ELOS/Goals: 20 to 24 days  -Continue CIR 2. Antithrombotics: -DVT/anticoagulation:Mechanical:Sequential compression devices, below kneeBilateral lower extremities. Bilateral lower extremity DVTs, age indeterminate, on . Started on Eliquis.   8/9- needs at least 3 months -antiplatelet therapy: N/A 3. Pain Management:Well controlled with prn Tylenol 4. Mood:LCSW to follow for evaluation and support. -antipsychotic agents: N/A 5. Neuropsych: This patient is capable of making decisions on her own behalf. 6. Skin/Wound Care:Stage II sacral ulcer-Monitor wound for healing. Will add protein supplement and multivitamin to promote healing.  8/20- stage II sacral ulcer- looking stable  -continue turning and local care  8/24- no significant change- staying off backside as much as possible.   -PRAFO's for heels 7. Fluids/Electrolytes/Nutrition:Monitor I/O. Check lytes in am.  8. HTN: Monitor BP tid--continue HCTZ daily.Check BMET/K+ level in am.  8/20- Off HCTZ- DBP in 40s, but SBP well controlled  8/22 bp controlled 9. Post op labs: Pre-op K+ low normal.   8/20- K+ 3.5- labs q Monday  8/23- K+ 3.7- doing better 10 Neurogenic bladder: Has been incontinent with external catheter--will monitor voiding with PVRs/bladder scan. UA negative. Continue Toviaz 4mg  daily  8/14: Continues to have urgency, no dysuria.  Has sensation of voiding.   8/20- still has 50% of time bladder incontinence- will send to Neuro-Urology after d/c.  11.Neurogenic bowel: KUB shows large stool burden and no obstruction. Had BM with enema on admission. On daily Miralax, colace TID, Senna HS.  bisacodyl 10mg  supp qpm  8/20- reduced Miralax to daily to help with loose stools- still getting bowel program nightly with lidocaine  8/22 stool still quite mushy. Hold colace for now. 8/23- doing better with reduction in colace/miralax per pt 12. Insomnia: Trazodone 100mg  did not provide benefit Try Amitriptyline 25mg .   8/15: Has been helping but she slept poorly last night.  13. Leukocytosis: WBC decreased to 12.4. UA/UC negative.    8/12- resolved 14. Bradycardia  8/20-21- asymptomatic- running low 60s- con't to monitor 15. Nausea  8/16- give prns meds 16. Sleepiness  8/18- has been really sedated last 2 days- afebrile- doesn't have any new meds- no Sx's of UTI  8/19-21 appears generally improved 17. Vertigo  8/24- will order PT evaluation for vertigo- sounds  like Benign positional vertigo 18. Dispo  8/19- pt said daughters cannot care for her, but doesn't want to go to SNF- will discuss with pt/daughter this afternoon.   8/20- spoke with pt and daughter for additional 25 minutes- so total care 40 minutes today- as detailed above.      LOS: 22 days A FACE TO FACE EVALUATION WAS PERFORMED  Lucion Dilger 09/25/2019, 8:37 AM

## 2019-09-25 NOTE — Progress Notes (Signed)
Occupational Therapy Session Note  Patient Details  Name: Haley Robinson MRN: [id number] Date of Birth: 1941-01-08  Today's Date: 09/25/2019 OT Individual Time: 0832-0930 OT Individual Time Calculation (min): 58 min    Short Term Goals: Week 4:  OT Short Term Goal 1 (Week 4): Patient will demonstrate squat-pivot transfers with Max A OT Short Term Goal 2 (Week 4): Pt will maintain dynamic sitting balance EOB/EOM with Min A during functional activities OT Short Term Goal 3 (Week 4): Pt will complete toilting tasks with mod A  Skilled Therapeutic Interventions/Progress Updates:  Patient met lying supine in bed c/o increased dizziness with movement. Patient reports dx. vertigo four years ago but recently noticed increased dizziness with movement x2 days. RN and MD aware. Patient able to pull up pants on L leg after OT assisted with threading. Assist required to thread RLE and hike over hips after patient rolled R<>L. Increased dizziness with rolling. Education provided on fixed gaze on target to reduce symptoms with patient reporting some decreased dizziness. Patient declined EOB/OOB activity this date with rest of session performed in supine. Focus on NMR with use of yellow thera-putty for 1 set x10 reps each. Session concluded with patient lying supine in bed with call bell within reach, bed alarm set and all needs met. Pillow positioned under BLE, under BLE and at R flank to promote pressure relief.   Therapy Documentation Precautions:  Precautions Precautions: Fall, Cervical Precaution Booklet Issued: Yes (comment) Precaution Comments: reviewed precautions Required Braces or Orthoses: Cervical Brace Cervical Brace: Soft collar (when OOB) Restrictions Weight Bearing Restrictions: No General: General OT Amount of Missed Time: 17 Minutes  Therapy/Group: Individual Therapy  Shadrack Brummitt R [place]Davis 09/25/2019, 7:27 AM

## 2019-09-25 NOTE — Plan of Care (Signed)
  Problem: Consults Goal: RH SPINAL CORD INJURY PATIENT EDUCATION Description:  See Patient Education module for education specifics.  Outcome: Progressing Goal: Skin Care Protocol Initiated - if Braden Score 18 or less Description: If consults are not indicated, leave blank or document N/A Outcome: Progressing   Problem: SCI BOWEL ELIMINATION Goal: RH STG MANAGE BOWEL WITH ASSISTANCE Description: STG Manage Bowel with mod Assistance. Outcome: Progressing Goal: RH STG SCI MANAGE BOWEL WITH MEDICATION WITH ASSISTANCE Description: STG SCI Manage bowel with medication with min/mod assistance. Outcome: Progressing   Problem: SCI BLADDER ELIMINATION Goal: RH STG MANAGE BLADDER WITH ASSISTANCE Description: STG Manage Bladder With mod Assistance Outcome: Progressing   Problem: RH SKIN INTEGRITY Goal: RH STG SKIN FREE OF INFECTION/BREAKDOWN Description: Pt will be free of skin breakdown/infection prior to DC with mod/max assist Outcome: Progressing Goal: RH STG MAINTAIN SKIN INTEGRITY WITH ASSISTANCE Description: STG Maintain Skin Integrity With mod/max Assistance. Outcome: Progressing   Problem: RH SAFETY Goal: RH STG ADHERE TO SAFETY PRECAUTIONS W/ASSISTANCE/DEVICE Description: STG Adhere to Safety Precautions With cues and reminders Assistance/Device. Outcome: Progressing   

## 2019-09-25 NOTE — Progress Notes (Signed)
Physical Therapy Session Note  Patient Details  Name: Haley Robinson MRN: [id number] Date of Birth: February 18, 1940  Today's Date: 09/25/2019 PT Individual Time:1130-1200; 1300-1400; [id number] PT Individual Time Calculation (min): 30 min and 60 min and 25 min   Short Term Goals: Week 3:  PT Short Term Goal 1 (Week 3): Pt will demonstrate ability to perform pressure relief independently via PWC or request assist with pressure relief when in TIS PT Short Term Goal 2 (Week 3): Pt will maintain static sitting balance with close Supervision x 5 min PT Short Term Goal 3 (Week 3): Pt will perform least restrictive transfer with assist x 1  Skilled Therapeutic Interventions/Progress Updates:    Session 1: Pt received supine in bed, reports ongoing dizziness and symptoms of vertigo this AM. Pt declines any OOB mobility due to symptoms. Reviewed family education schedule and plans for things to be covered during family education sessions. Also discussed use of tilt table this PM and educated pt on purpose and benefit of tilt table. Pt agreeable to attempt this PM pending symptoms. Pt agreeable to be positioned in sidelying to prevent further skin breakdown. Rolling from supine onto R side with min A. Pt does report onset of vertigo symptoms with rolling that subside at rest. Pt left in R sidelying in bed with needs in reach at end of session.  Session 2: Pt received in L sidelying in bed, agreeable to PT session. No complaints of pain. Pt agreeable to attempt tilt table this PM. L sidelying to sitting EOB with max A for BLE management and trunk control. Pt reports increase in dizziness and vertigo symptoms with position change but they improve with seated rest break. Slide board transfer bed to w/c with max A x 1 and SBA x 1 for safety. Pt is able to drive power wheelchair x 196 ft at Supervision level with cues for speed and obstacle navigation. Maxi move transfer w/c to tilt table. See progression on tilt  table and BP noted below:  Supine (0 degrees): 141/53 15 degrees: not assessed, no change in symptoms 30 degrees: 113/58 40 degrees: 110/55 50 degrees: 113/58  Pt able to tolerate positioning at 50 degrees incline x 15 min while performing volleyball toss with alt UE. Pt occasionally exhibits LOB to the R due to decreased trunk control but is able to correct independently with cueing. Reviewed purpose of tilt table for BLE NMR via forced WBing in semi-standing position as well as working on trunk control and strengthening via volleyball toss. Maxi move transfer back to w/c. Pt left semi-reclined in power wheelchair in room with needs in reach, daughter present at end of session.  Session 3: Pt received seated in power wheelchair in room, agreeable to PT session. Pt's daughter [name] present and with some questions with regards to d/c plan and upcoming family education sessions. Reviewed family education schedule, topics that will be covered in family ed session, seating evaluation expectations and that it will take place tomorrow, transport in the community via handicap accessible public transportation vs wheelchair Zenaida Niece, and that pt will require medical transport home as she will not be safe to perform car transfers initially upon d/c. Re-assessed BLE strength and sensation as pt exhibited some voluntary movement of RLE in supine this AM. Unable to detect trace activation of RLE in seated position, sensation impaired in BLE proximally > distally. Pt left semi-reclined in power wheelchair in room with needs in reach at end of session.  Therapy Documentation Precautions:  Precautions Precautions: Fall, Cervical Precaution Booklet Issued: Yes (comment) Precaution Comments: reviewed precautions Required Braces or Orthoses: Cervical Brace Cervical Brace: Soft collar (when OOB) Restrictions Weight Bearing Restrictions: No    Therapy/Group: Individual Therapy   Peter Congo, PT,  DPT  09/25/2019, 3:40 PM

## 2019-09-25 NOTE — Patient Care Conference (Signed)
Inpatient RehabilitationTeam Conference and Plan of Care Update Date: 09/25/2019   Time: 11:30 AM    Patient Name: Haley Robinson      Medical Record Number: [id number]  Date of Birth: Oct 14, 1940 Sex: Female         Room/Bed: 4M03C/4M03C-01 Payor Info: Payor: [employer] MEDICARE / Plan: [employer] MEDICARE CHOICE PPO / Product Type: *No Product type* /    Admit Date/Time:  09/03/2019  5:32 PM  Primary Diagnosis:  Cervical myelopathy [facility])  Hospital Problems: Principal Problem:   Cervical myelopathy (HCC) Active Problems:   Cervical arthritis with myelopathy   Pressure injury of skin    Expected Discharge Date: Expected Discharge Date: 10/03/19  Team Members Present: Physician leading conference: Dr. Genice Rouge Care Coodinator Present: Cecile Sheerer, LCSWA;Orien Mayhall Marlyne Beards, RN, BSN, CRRN Nurse Present: Greta Doom, RN PT Present: Peter Congo, Clois Comber, PT OT Present: Roney Mans, OT SLP Present: Colin Benton, SLP PPS Coordinator present : Edson Snowball, PT     Current Status/Progress Goal Weekly Team Focus  Bowel/Bladder   Pt continent/ incontinent of B/B. Bowel program with consistent results  Continue bowel program  Assess bowel/bladder every shift and prn   Swallow/Nutrition/ Hydration             ADL's   Min A at best and Max A at most 2/2 decreased GMC and FMC. LB dressing at bed level with Max A. UB bathing/dressing at sink levle with set-up assist. SB txf. with Max A +2. Rolling R<>L with Min A.  min A overall (to be downgraded to mod A overall)  Bed mobility, LB bathing/dressing, functional transfers, sitting balance, family education   Mobility   min A rolling, mod to max A supine to/from sit, progressing to min/mod A for SB transfer, Supervision PWC mobility  dependent transfers via lift, mod I PWC mobility, min A bed mobility  seating evaluation for custom w/c, family education   Communication             Safety/Cognition/ Behavioral  Observations            Pain   Pt intermittently rates pain about a 6-7. PRN Tramadol providing good pain management  For pt's pain to be <3  Assess pain every shift and prn. Provide PRN pain meds when needed.   Skin   Stage two to coccyx  stage two to heal with no new skin breakdown  Assess skin every shift and prn. Continue turning pt every 2 hours.     Discharge Planning:  Pt daughter [name] and husband live with pt in the home. Both daughters- [name] and [name] will alternate their time with assisting with care. Fam edu 8/25 with dtr [name] and grandson Heath 1pm-3:30pm; 9/26-8/27 with dtr tracy 1pm-3:30pm; 8/28 10am-12pm with daughtrs [name] and [name].   Team Discussion: Continent of bladder with incontinent episodes, bowel program having consistent results. Stage 2 on coccyx has half yellow slough, WOC consult placed. Patient can sit for longer periods of time. However, still max/total assist with ADL's. Not a lot of progress with PT, W/C eval tomorrow. Family education scheduled. Patient on target to meet rehab goals: no, therapy to down grade some goals.  *See Care Plan and progress notes for long and short-term goals.   Revisions to Treatment Plan:  None at this time.  Teaching Needs: Continue family education.  Current Barriers to Discharge: Decreased caregiver support, Home enviroment access/layout, Incontinence, Neurogenic bowel and bladder, Wound care, Weight bearing restrictions and Medication  compliance  Possible Resolutions to Barriers: Family to take shifts to take care of patient. Wound to sacrum will need family education, teach bowel program, teach In & Out cath if necessary, teach weight bearing precautions to family, and education family on medications.     Medical Summary Current Status: spasticity- doesn't want meds; vertigo- ordered PT; bowel program; STage II on sacrum- yellow slough  Barriers to Discharge: Behavior;Wound care;Neurogenic Bowel &  Bladder;Incontinence;Weight bearing restrictions;Decreased family/caregiver support;Home enviroment access/layout;Medication compliance  Barriers to Discharge Comments: Stage II wound- will consult WOC- tetraparesis- doesn't like to stay off bottom- family to come in for educaiton- sitting balance little better Possible Resolutions to [employer] Focus: spasms- doesn't want meds- max A for dynamics sitting/ADLs;  SB somewhat better- w/c eval tomorrow-  d/c- 9/1   Continued Need for Acute Rehabilitation Level of Care: The patient requires daily medical management by a physician with specialized training in physical medicine and rehabilitation for the following reasons: Direction of a multidisciplinary physical rehabilitation program to maximize functional independence : Yes Medical management of patient stability for increased activity during participation in an intensive rehabilitation regime.: Yes Analysis of laboratory values and/or radiology reports with any subsequent need for medication adjustment and/or medical intervention. : Yes   I attest that I was present, lead the team conference, and concur with the assessment and plan of the team.   Tennis Must 09/25/2019, 4:59 PM

## 2019-09-26 ENCOUNTER — Encounter (HOSPITAL_COMMUNITY): Payer: Medicare PPO | Admitting: Occupational Therapy

## 2019-09-26 ENCOUNTER — Ambulatory Visit (HOSPITAL_COMMUNITY): Payer: Medicare PPO | Admitting: Physical Therapy

## 2019-09-26 ENCOUNTER — Encounter (HOSPITAL_COMMUNITY): Payer: Medicare PPO | Admitting: Psychology

## 2019-09-26 ENCOUNTER — Inpatient Hospital Stay (HOSPITAL_COMMUNITY): Payer: Medicare PPO | Admitting: Occupational Therapy

## 2019-09-26 LAB — URINALYSIS, ROUTINE W REFLEX MICROSCOPIC
Bilirubin Urine: NEGATIVE
Glucose, UA: NEGATIVE mg/dL
Hgb urine dipstick: NEGATIVE
Ketones, ur: NEGATIVE mg/dL
Nitrite: NEGATIVE
Protein, ur: 30 mg/dL — AB
Specific Gravity, Urine: 1.015 (ref 1.005–1.030)
WBC, UA: >50 WBC/hpf — ABNORMAL HIGH (ref 0–5)
pH: 5 (ref 5.0–8.0)

## 2019-09-26 MED ORDER — SENNA 8.6 MG PO TABS: 2.0000 | ORAL_TABLET | Freq: Every day | ORAL | Status: DC

## 2019-09-26 MED ORDER — SENNA 8.6 MG PO TABS
2.0000 | ORAL_TABLET | Freq: Every day | ORAL | Status: DC
  Administered 2019-09-26 – 2019-10-03 (×8): 17.2 mg via ORAL
  Filled 2019-09-26 (×8): qty 2

## 2019-09-26 NOTE — Progress Notes (Signed)
Bowel program complete. No results. Nothing in rectal vault.

## 2019-09-26 NOTE — Progress Notes (Signed)
Physical Therapy Session Note  Patient Details  Name: Haley Robinson MRN: [id number] Date of Birth: 18-Sep-1940  Today's Date: 09/26/2019 PT Individual Time: 1300-1415 PT Individual Time Calculation (min): 75 min   Short Term Goals: Week 3:  PT Short Term Goal 1 (Week 3): Pt will demonstrate ability to perform pressure relief independently via [place] or request assist with pressure relief when in TIS PT Short Term Goal 1 - Progress (Week 3): Progressing toward goal PT Short Term Goal 2 (Week 3): Pt will maintain static sitting balance with close Supervision x 5 min PT Short Term Goal 2 - Progress (Week 3): Met PT Short Term Goal 3 (Week 3): Pt will perform least restrictive transfer with assist x 1 PT Short Term Goal 3 - Progress (Week 3): Progressing toward goal  Skilled Therapeutic Interventions/Progress Updates:     Pt received seated in power wheelchair in room, reports urgent urge to urinate. Slide board transfer back to bed with assist x 2 for safety. Sit to supine max A for trunk control and BLE management. Rolling L/R with min A for bedpan placement. Pt unable to void once on bedpan. Assisted pt with re-donning a brief and her pants. Deatra Ina, ATP present for seating evaluation as well as pt's daughter and grandson. Pt is max A for supine to sitting EOB. Pt and her family able to participate in seating evaluation and engage in discussion with regards to preferred method of mobility and features desired in a wheelchair. Pt and her family agreeable to power wheelchair at this time after discussion. Reviewed use of manual hoyer lift for transfers bed to/from wheelchair. Per family report they have obtained a hospital bed and hoyer lift for use at home. Reviewed use of hospital bed features and positioning to maintain good body mechanics when assisting pt with bed mobility. Demonstrated placement of manual hoyer sling and use of hoyer lift. Pt's daughter Olivia Mackie and grandson Myrle Sheng demonstrate  good understanding of education and good return demonstration of hoyer sling placement and use of lift. Pt left semi-reclined in bed with needs in reach, family present, ready for next family education session with OT.  Therapy Documentation Precautions:  Precautions Precautions: Fall, Cervical Precaution Booklet Issued: Yes (comment) Precaution Comments: reviewed precautions Required Braces or Orthoses: Cervical Brace Cervical Brace: Soft collar, At all times Restrictions Weight Bearing Restrictions: No   Therapy/Group: Individual Therapy   Excell Seltzer, PT, DPT  09/26/2019, 3:06 PM

## 2019-09-26 NOTE — Progress Notes (Signed)
Occupational Therapy Session Note  Patient Details  Name: Haley Robinson MRN: [id number] Date of Birth: [birth year]  Today's Date: 09/26/2019 OT Individual Time: 2979-8921 OT Individual Time Calculation (min): 72 min    Short Term Goals: Week 4:  OT Short Term Goal 1 (Week 4): Patient will demonstrate squat-pivot transfers with Max A OT Short Term Goal 2 (Week 4): Pt will maintain dynamic sitting balance EOB/EOM with Min A during functional activities OT Short Term Goal 3 (Week 4): Pt will complete toilting tasks with mod A  Skilled Therapeutic Interventions/Progress Updates:    Pt seen for vestibular evaluation.  She was in bed resting to begin session.  She reported ongoing dizziness over the past four years which has worsened since being in the hospital.  Pt with no history of CVA and reports no pain or ringing in either ear, or any significant loss of hearing.  Visual tracking was WFLs as well as occular ROM and convergence.  Pt reports only needing reading glasses with near vision, but not for far vision.  In bed, completed testing for horizontal canal BPPV with positive result in the left ear.  Increased horizontal geotropic nystagmus noted rolling to the left and the right, but more severe with rolling to the left.  Attempted barbecue roll for treatment with +2 assistance, but this attempt did not remedy her dizziness, and was very difficult to complete based on her cervical precautions and need for total +2.  Transitioned to sitting with overall max assist and then attempted modified Appiani maneuver.  This was completed X2 but was also unsuccessful in remedying the horizontal BPPV as pt cannot smoothly complete transition from one position to the other.  Will continue to monitor in therapy, but feel that she will likely need follow-up outpatient treatment to successfully treat her horizontal BPPV, once cervical AROM is allowed to [facility].  Finished session with therapist's assisting pt with  donning pants in supine rolling with total assist +2 and then with transfer to the power wheelchair via sliding board at the same level.  Pt was left with the soft touch call button in reach as well as safety belt in place.    Therapy Documentation Precautions:  Precautions Precautions: Fall, Cervical Precaution Booklet Issued: Yes (comment) Precaution Comments: reviewed precautions Required Braces or Orthoses: Cervical Brace Cervical Brace: Soft collar, At all times Restrictions Weight Bearing Restrictions: No  Pain: Pain Assessment Pain Scale: Faces Faces Pain Scale: Hurts a little bit Pain Type: Acute pain Pain Location: Leg Pain Orientation: Right Pain Descriptors / Indicators: Discomfort Pain Onset: With Activity Pain Intervention(s): Repositioned ADL: See Care Tool Section for some details of mobility and selfcare  Therapy/Group: Individual Therapy  Katara Griner OTR/L 09/26/2019, 10:47 AM

## 2019-09-26 NOTE — Progress Notes (Signed)
Occupational Therapy Session Note  Patient Details  Name: Haley Robinson MRN: [id number] Date of Birth: 05/24/1940  Today's Date: 09/26/2019 OT Individual Time: 1418-1445 OT Individual Time Calculation (min): 27 min    Short Term Goals: Week 4:  OT Short Term Goal 1 (Week 4): Patient will demonstrate squat-pivot transfers with Max A OT Short Term Goal 2 (Week 4): Pt will maintain dynamic sitting balance EOB/EOM with Min A during functional activities OT Short Term Goal 3 (Week 4): Pt will complete toilting tasks with mod A  Skilled Therapeutic Interventions/Progress Updates:  Patient met lying supine in bed in agreement with OT treatment session with focus on family education as detailed below. Education provided on completion of bathing/dressing in supine and supported sitting with HOB elevated. OT provided education on body mechanics with rolling in supine to don brief and LB clothing. Family aware of wound on bottom with instruction for repositioning/pressure relief in supine and in PWC. OT also provided education on set-up for grooming tasks at sink level and recommendation for establishing a bathroom at home that PWC can fit into. Also discussed possibility of grooming at kitchen sink. Patient with request to return to PWC. OT exited room to retrieve manual hoyer lift. Upon return, patient being cathed by nursing staff. Patient missed 18 minutes of skilled OT session. Session concluded with patient lying supine in bed with call bell within reach, bed alarm activated, and all needs met.  Therapy Documentation Precautions:  Precautions Precautions: Fall, Cervical Precaution Booklet Issued: Yes (comment) Precaution Comments: reviewed precautions Required Braces or Orthoses: Cervical Brace Cervical Brace: Soft collar (when OOB) Restrictions Weight Bearing Restrictions: No General: General OT Amount of Missed Time: 18 Minutes  Therapy/Group: Individual Therapy  Destanae R  Howerton-Davis 09/26/2019, 7:22 AM 

## 2019-09-26 NOTE — Consult Note (Signed)
Neuropsychological Consultation   Patient:   Haley Robinson   DOB:   March 07, 1940  MR Number:  [id number]  Location:  [facility] [address]ENTER [address] [id number] [place] [address] Dept: [phone] Loc: [phone]           Date of Service:   09/26/2019  Start Time:   4 PM End Time:   5 PM  Provider/Observer:  Arley Phenix, Psy.D.       Clinical Neuropsychologist       Billing Code/Service: 96158/96159  Chief Complaint:    Tijah Hane. Ardila is a 79 year old female with history of hypertension, anxiety associated with MRI procedures, numbness and weakness of the hand since 2000 felt to be due to cervical spinal stenosis.  1 month prior to admission, she started having progressive symptoms with gait instability and spasticity left greater than right side.  Work-up revealed severe cervical stenosis with myelopathy C4/5 to C6/7.  Patient admitted on 08/28/2019 for ACDF C4-C7.  Postoperatively, the patient had weakness bilaterally of lower extremities with spasms and right lower extremity and sensory deficits around C7.  Steroids were added for symptoms which were gradually improve for a few weeks.  Therapy ongoing and patient limited by quadriplegia and working on pregait activity/standing trials.  Patient has been admitted to the CIR due to functional decline.  Reason for Service:  NID:POEU P. Ian Malkin is a 79 year old female with history of HTN, numbness and weakness in hands since 2000 felt to be due to cervical spinal stenosis. 1 month prior to admission, she started having progressive symptoms with gait instability and spasticity left greater than right side. Work-up done revealing severe cervical stenosis with myelopathy C4/5 to C6/7. She was admitted on 08/28/2019 for ACDF C4-C7 by Dr. Dutch Quint. Postop has had weakness BLE with spasms RLE and sensory deficits around C7. Neurosurgery added steroids for postop  symptoms which would gradually improve a few weeks.Therapy ongoing and patient limited by quadriplegia and working on pregait activity/standing trials. CIR recommended due to functional decline.   Current Status:  The patient was well oriented with good cognition today.  The patient's mood was improved and while she acknowledges continued anxiety and apprehension around ongoing difficulties and her plan discharge next week she is doing better and feeling better about her overall situation.  She is progressing in therapies overall.  Behavioral Observation: CAMIKA MARSICO  presents as a 79 y.o.-year-old Right Caucasian Female who appeared her stated age. her dress was Appropriate and she was Well Groomed and her manners were Appropriate to the situation.  her participation was indicative of Appropriate and Attentive behaviors.  There were physical disabilities noted.  she displayed an appropriate level of cooperation and motivation.     Interactions:    Active Appropriate and Attentive  Attention:   within normal limits and attention span and concentration were age appropriate  Memory:   within normal limits; recent and remote memory intact  Visuo-spatial:  not examined  Speech (Volume):  normal  Speech:   normal; normal  Thought Process:  Coherent and Relevant  Though Content:  WNL; not suicidal and not homicidal  Orientation:   person, place, time/date and situation  Judgment:   Good  Planning:   Good  Affect:    Appropriate  Mood:    Dysphoric  Insight:   Good  Intelligence:   normal  Medical History:   Past Medical History:  Diagnosis Date  .  Anxiety    w/ procedures  . Carotid bruit   . Hyperlipidemia   . Hypertension   . Numbness and tingling in both hands   . Osteoarthritis    hands  . Pneumonia    x 1 - over 30 yrs ago  . PONV (postoperative nausea and vomiting)    Nausea with hysterectomy surgery only, no problems with other procedures/surgeries  .  Spinal stenosis   . Vertigo, benign positional    x 4 years  . Vitamin D deficiency    Psychiatric History:  Patient has no prior psychiatric history.  The patient does have some claustrophobia which is exacerbated when she has procedure such as an MRI.  The patient was given as needed Xanax for MRI procedures but does not take this medication in other situations.  Family Med/Psych History:  Family History  Problem Relation Age of Onset  . Alzheimer's disease Mother   . Lung cancer Father     Impression/DX:  Mariateresa Batra. Ebrahim is a 79 year old female with history of hypertension, anxiety associated with MRI procedures, numbness and weakness of the hand since 2000 felt to be due to cervical spinal stenosis.  1 month prior to admission, she started having progressive symptoms with gait instability and spasticity left greater than right side.  Work-up revealed severe cervical stenosis with myelopathy C4/5 to C6/7.  Patient admitted on 08/28/2019 for ACDF C4-C7.  Postoperatively, the patient had weakness bilaterally of lower extremities with spasms and right lower extremity and sensory deficits around C7.  Steroids were added for symptoms which were gradually improve for a few weeks.  Therapy ongoing and patient limited by quadriplegia and working on pregait activity/standing trials.  Patient has been admitted to the CIR due to functional decline.  The patient was well oriented with good cognition today.  The patient's mood was improved and while she acknowledges continued anxiety and apprehension around ongoing difficulties and her plan discharge next week she is doing better and feeling better about her overall situation.  She is progressing in therapies overall.  Disposition/Plan:  Today we worked on coping and adjustment issues particular around her apprehension related to upcoming discharge and worry about how she will progress going forward.  Given the patient's discharge on 10/03/2019 there likely  will be an opportunity to see her again and I think that overall she is doing well.  Diagnosis:   Cervical spinal stenosis with myelopathy and loss of motor function and sensory changes.        Electronically Signed   _______________________ Arley Phenix, Psy.D.

## 2019-09-26 NOTE — Progress Notes (Addendum)
Patient ID: Haley Robinson, female   DOB: 03/24/1940, 78 y.o.   MRN: [id number] ° ° °SW met with pt, pt dtr Tracy and grandson Heath in room to discuss HHA preference. SW provided list. Preferred HH is [facility] in [place].  SW discussed some of the DME recommendations: hospital bed, hoyer lift, and slide board. Dtr states she was able to get a hospital bed for free so it is not needed. SW informed there will continue to be updates once SW aware of all DME needed.  ° °SW spoke with Ashley/Intake with [facility] ([phone]/f:[phone]) to discuss referral. Reports will review, and will follow up if able to accept.  °*referral accepted. SOC will be on 10/05/2019. SW faxed over order.  ° °Auria Chamberlain, MSW, LCSWA °Office: [phone] °Cell: [phone] °Fax: [phone] °

## 2019-09-26 NOTE — Progress Notes (Signed)
[place] PHYSICAL MEDICINE & REHABILITATION PROGRESS NOTE   Subjective/Complaints:  Ordered PT to see for vertigo- due to cervical precautions, might not be able to fix issue, but they will see her.   Pt reports difficulty voiding for last 2 days- has required in/out caths multiple times- did void this AM.    small BM with bowel program last night.   ROS:   Pt denies SOB, abd pain, CP, N/V/C/D, and vision changes   Objective:   No results found. Recent Labs    09/24/19 0429  WBC 7.1  HGB 10.5*  HCT 32.5*  PLT 272   Recent Labs    09/24/19 0429  NA 140  K 3.7  CL 103  CO2 28  GLUCOSE 101*  BUN 12  CREATININE 0.82  CALCIUM 8.8*    Intake/Output Summary (Last 24 hours) at 09/26/2019 0825 Last data filed at 09/26/2019 0447 Gross per 24 hour  Intake 322 ml  Output 360 ml  Net -38 ml     Physical Exam: Vital Signs Blood pressure (!) 133/49, pulse 63, temperature 98.5 F (36.9 C), resp. rate 18, height 5\' 5"  (1.651 m), weight 62.9 kg, SpO2 95 %. Constitutional: laying supine- on sacrum, appropriate, wearing soft collar, NAD HEENT: EOMI, oral membranes moist Neck: supple Cardiovascular: RRR Respiratory/Chest: CTA B/L- no W/R/R- good air movement GI/Abdomen: Soft, NT, ND, (+)BS  Ext: no clubbing, cyanosis, or edema Psych: flat affect Skin: < 2 cm- open- oval shaped- just the top layer gone, but very superficial - between buttocks- in crease- Stage II. ..  Neurologic: spasms seen, RLE>LLE- was causing pt pain RLE- 0 to tr/5 Musculoskeletal: No pain with active assisted as well as passive range of motion in the upper lower limb no joint swelling. Right heel a little sore, skin intact as above Sensation intact throughout    Assessment/Plan: 1. Functional deficits secondary to tetraparesis secondary to cervical myelopathy which require 3+ hours per day of interdisciplinary therapy in a comprehensive inpatient rehab setting.  Physiatrist is providing close  team supervision and 24 hour management of active medical problems listed below.  Physiatrist and rehab team continue to assess barriers to discharge/monitor patient progress toward functional and medical goals  Care Tool:  Bathing    Body parts bathed by patient: Right arm, Left arm, Chest, Abdomen, Front perineal area, Right upper leg, Left upper leg, Right lower leg, Face   Body parts bathed by helper: Buttocks, Left lower leg, Right lower leg     Bathing assist Assist Level: Moderate Assistance - Patient 50 - 74%     Upper Body Dressing/Undressing Upper body dressing   What is the patient wearing?: Pull over shirt    Upper body assist Assist Level: Supervision/Verbal cueing    Lower Body Dressing/Undressing Lower body dressing      What is the patient wearing?: Pants     Lower body assist Assist for lower body dressing: Maximal Assistance - Patient 25 - 49%     Toileting Toileting    Toileting assist Assist for toileting: Maximal Assistance - Patient 25 - 49%     Transfers Chair/bed transfer  Transfers assist     Chair/bed transfer assist level: 2 Helpers     Locomotion Ambulation   Ambulation assist   Ambulation activity did not occur: Safety/medical concerns          Walk 10 feet activity   Assist  Walk 10 feet activity did not occur: Safety/medical concerns  Walk 50 feet activity   Assist Walk 50 feet with 2 turns activity did not occur: Safety/medical concerns         Walk 150 feet activity   Assist Walk 150 feet activity did not occur: Safety/medical concerns         Walk 10 feet on uneven surface  activity   Assist Walk 10 feet on uneven surfaces activity did not occur: Safety/medical concerns         Wheelchair     Assist Will patient use wheelchair at discharge?: Yes Type of Wheelchair: Power    Wheelchair assist level: Supervision/Verbal cueing Max wheelchair distance: 200    Wheelchair 50  feet with 2 turns activity    Assist        Assist Level: Supervision/Verbal cueing   Wheelchair 150 feet activity     Assist      Assist Level: Supervision/Verbal cueing   Blood pressure (!) 133/49, pulse 63, temperature 98.5 F (36.9 C), resp. rate 18, height 5\' 5"  (1.651 m), weight 62.9 kg, SpO2 95 %.  Medical Problem List and Plan: 1.   Tetraparesis secondary to cervical myelopathy -patient may  shower -ELOS/Goals: 20 to 24 days  -Continue CIR 2. Antithrombotics: -DVT/anticoagulation:Mechanical:Sequential compression devices, below kneeBilateral lower extremities. Bilateral lower extremity DVTs, age indeterminate, on . Started on Eliquis.   8/9- needs at least 3 months -antiplatelet therapy: N/A 3. Pain Management:Well controlled with prn Tylenol 4. Mood:LCSW to follow for evaluation and support. -antipsychotic agents: N/A 5. Neuropsych: This patient is capable of making decisions on her own behalf. 6. Skin/Wound Care:Stage II sacral ulcer-Monitor wound for healing. Will add protein supplement and multivitamin to promote healing.  8/20- stage II sacral ulcer- looking stable  -continue turning and local care  8/24- no significant change- staying off backside as much as possible.  8/25- consulted WOC -they think due to MASD- in gluteal fold at coccyx- started Aquacel with foam dressing  -PRAFO's for heels 7. Fluids/Electrolytes/Nutrition:Monitor I/O. Check lytes in am.  8. HTN: Monitor BP tid--continue HCTZ daily.Check BMET/K+ level in am.  8/20- Off HCTZ- DBP in 40s, but SBP well controlled  8/22 bp controlled 9. Post op labs: Pre-op K+ low normal.   8/20- K+ 3.5- labs q Monday  8/23- K+ 3.7- doing better 10 Neurogenic bladder: Has been incontinent with external catheter--will monitor voiding with PVRs/bladder scan. UA negative. Continue Toviaz 4mg  daily  8/14: Continues to have urgency, no  dysuria. Has sensation of voiding.   8/20- still has 50% of time bladder incontinence- will send to Neuro-Urology after d/c.   8/24- urinary retention occurring- will check U/A and Cx since is new- likely has forming UTI.  11.Neurogenic bowel: KUB shows large stool burden and no obstruction. Had BM with enema on admission. On daily Miralax, colace TID, Senna HS.  bisacodyl 10mg  supp qpm  8/20- reduced Miralax to daily to help with loose stools- still getting bowel program nightly with lidocaine  8/22 stool still quite mushy. Hold colace for now. 8/23- doing better with reduction in colace/miralax per pt  8/25- small BM last night- will monitor to make sure going enough 12. Insomnia: Trazodone 100mg  did not provide benefit Try Amitriptyline 25mg .   8/15: Has been helping but she slept poorly last night.  13. Leukocytosis: WBC decreased to 12.4. UA/UC negative.    8/12- resolved 14. Bradycardia  8/20-21- asymptomatic- running low 60s- con't to monitor 15. Nausea  8/16- give prns meds 16. Sleepiness  8/18-  has been really sedated last 2 days- afebrile- doesn't have any new meds- no Sx's of UTI  8/19-21 appears generally improved 17. Vertigo  8/24- will order PT evaluation for vertigo- sounds like Benign positional vertigo  8/25- will eval, but might not be able to treat due to cervical precautions- FYI 18. Urinary retention  8/25- check U/A and Cx 19. Dispo  8/19- pt said daughters cannot care for her, but doesn't want to go to SNF- will discuss with pt/daughter this afternoon.   8/20- spoke with pt and daughter for additional 25 minutes- so total care 40 minutes today- as detailed above.      LOS: 23 days A FACE TO FACE EVALUATION WAS PERFORMED  Adrienne Trombetta 09/26/2019, 8:25 AM

## 2019-09-26 NOTE — Progress Notes (Addendum)
Bowel protocol resulting in small soft brown stool

## 2019-09-26 NOTE — Progress Notes (Signed)
Physical Therapy Note  Patient Details  Name: Haley Robinson MRN: [id number] Date of Birth: [birth year] Today's Date: 09/26/2019    Recommending the following equipment to increase functional independence with mobility: ~ Specialty seating evaluation for power wheelchair completed with [employer]  ~ Hospital Bed  ~ [facility] lift  Patient will require use of a power wheelchair, hospital bed, and hoyer lift due to her diagnosis of cervical myelopathy and stenosis resulting in incomplete quadraplegia. Pt is unable to position herself for pressure relief and comfort in a regular bed and her family is unable to safely assist her with mobility and ADLs with use of a regular bed. Also, patient is unable to transfer safely with family without dependent use of a manual hoyer lift.  Peter Congo, PT, DPT  09/26/2019, 3:19 PM

## 2019-09-27 ENCOUNTER — Inpatient Hospital Stay (HOSPITAL_COMMUNITY): Payer: Medicare PPO | Admitting: Occupational Therapy

## 2019-09-27 ENCOUNTER — Ambulatory Visit (HOSPITAL_COMMUNITY): Payer: Medicare PPO | Admitting: Physical Therapy

## 2019-09-27 ENCOUNTER — Encounter (HOSPITAL_COMMUNITY): Payer: Medicare PPO | Admitting: Occupational Therapy

## 2019-09-27 MED ORDER — CEPHALEXIN 250 MG PO CAPS
500.0000 mg | ORAL_CAPSULE | Freq: Three times a day (TID) | ORAL | Status: DC
  Administered 2019-09-27 – 2019-09-28 (×4): 500 mg via ORAL
  Filled 2019-09-27 (×4): qty 2

## 2019-09-27 NOTE — Progress Notes (Signed)
Occupational Therapy Session Note  Patient Details  Name: Haley Robinson MRN: [id number] Date of Birth: 1940/08/02  Today's Date: 09/27/2019 OT Individual Time: 1001-1034 OT Individual Time Calculation (min): 33 min    Short Term Goals: Week 4:  OT Short Term Goal 1 (Week 4): Patient will demonstrate squat-pivot transfers with Max A OT Short Term Goal 2 (Week 4): Pt will maintain dynamic sitting balance EOB/EOM with Min A during functional activities OT Short Term Goal 3 (Week 4): Pt will complete toilting tasks with mod A  Skilled Therapeutic Interventions/Progress Updates:    Pt up in the power wheelchair to start session.  She worked on brushing her teeth at the sink to start.  She was able to remove the cap from the toothpaste but needed mod facilitation to apply it on the toothbrush.  She dropped the toothbrush when attempting to hold it with the left hand and therapist had to replace it.  Foam grip was applied to the new one to assist with making the handle larger.  She next was able to complete brushing her teeth and rinsing out her mouth with setup assist.  Once grooming task was complete, she was able to drive her wheelchair down to the ortho gym with supervision.  Had her work on [employer] coordination picking up and placing small wooden 2" pegs in the grid.  She could pick them up from the container at chest level with the LUE and place them in the peg holes with 50% accuracy.  She did exhibit some drops with difficulty picking the peg up off of the table with the LUE.  She was unable to pick up pegs from the container and place them with the RUE, however she could take them out of the peg holes and drop them in the container with increased time.  Finished session with return to the room via power wheelchair and pt left sitting up with the call button and phone in reach.    Therapy Documentation Precautions:  Precautions Precautions: Fall, Cervical Precaution Booklet Issued: Yes  (comment) Precaution Comments: reviewed precautions Required Braces or Orthoses: Cervical Brace Cervical Brace: Soft collar, At all times Restrictions Weight Bearing Restrictions: No  Pain: Pain Assessment Pain Scale: Faces Pain Score: 0-No pain ADL: See Care Tool Section for some details of mobility and selfcare  Therapy/Group: Individual Therapy  Shanekqua Schaper OTR/L 09/27/2019, 12:34 PM

## 2019-09-27 NOTE — Plan of Care (Signed)
  Problem: Consults Goal: RH SPINAL CORD INJURY PATIENT EDUCATION Description:  See Patient Education module for education specifics.  Outcome: Progressing Goal: Skin Care Protocol Initiated - if Braden Score 18 or less Description: If consults are not indicated, leave blank or document N/A Outcome: Progressing   Problem: SCI BOWEL ELIMINATION Goal: RH STG MANAGE BOWEL WITH ASSISTANCE Description: STG Manage Bowel with mod Assistance. Outcome: Progressing Goal: RH STG SCI MANAGE BOWEL WITH MEDICATION WITH ASSISTANCE Description: STG SCI Manage bowel with medication with min/mod assistance. Outcome: Progressing

## 2019-09-27 NOTE — Progress Notes (Signed)
Bowel protocol resulting in large brown stool and also a large void.

## 2019-09-27 NOTE — Progress Notes (Signed)
Pt suppository given. Bowel stimulation per protocol. Small amount of stool noted to glove.

## 2019-09-27 NOTE — Progress Notes (Signed)
Occupational Therapy Session Note  Patient Details  Name: Haley SHURTZ MRN: [id number] Date of Birth: 16-Nov-1940  Today's Date: 09/27/2019 OT Individual Time: 1403-1500 OT Individual Time Calculation (min): 57 min    Short Term Goals: Week 4:  OT Short Term Goal 1 (Week 4): Patient will demonstrate squat-pivot transfers with Max A OT Short Term Goal 2 (Week 4): Pt will maintain dynamic sitting balance EOB/EOM with Min A during functional activities OT Short Term Goal 3 (Week 4): Pt will complete toilting tasks with mod A  Skilled Therapeutic Interventions/Progress Updates:  Patient met seated in [place] in agreement with OT treatment session with focus on community re-integration as detailed below. Patient able to maneuver new loaner PWC with joystick around hospital to atrium. Patient reports nervousness about maneuvering wc in tight spaces. In hospital gift shop, patient able to maneuver around obstacles throughout store. Patient also expressed concern about her children assisting with bladder and bowel program at home. OT provided education on options for linen including reusable vs. 1 time use chuck pads and briefs. Patient expressed verbal understanding. OT encouraged patient to participate in as many desired tasks upon return home as possible including money management, grocery shopping, and parts of meal prep with assistance. Patient returned to supine with Max A +2 and use of slideboard. Session concluded with patient lying supine in bed with NT present for hygiene/clothing management.  Therapy Documentation Precautions:  Precautions Precautions: Fall, Cervical Precaution Booklet Issued: Yes (comment) Precaution Comments: reviewed precautions Required Braces or Orthoses: Cervical Brace Cervical Brace: Soft collar, At all times Restrictions Weight Bearing Restrictions: No General:    Therapy/Group: Individual Therapy  Leonard Hendler R [place] 09/27/2019, 1:15 PM

## 2019-09-27 NOTE — Progress Notes (Signed)
Occupational Therapy Session Note  Patient Details  Name: Haley Robinson MRN: [id number] Date of Birth: [birth year]  Today's Date: 09/27/2019 OT Individual Time: 0830-0930 OT Individual Time Calculation (min): 60 min    Short Term Goals: Week 1:  OT Short Term Goal 1 (Week 1): Patient will maintain unsupported static sitting balance with Min A in prep for BADLs. OT Short Term Goal 1 - Progress (Week 1): Met OT Short Term Goal 2 (Week 1): Patient will don UB clothing with Mod A and use of compensatory technique. OT Short Term Goal 2 - Progress (Week 1): Met OT Short Term Goal 3 (Week 1): Patient will demonstrate squat-pivot transfers with Max A. OT Short Term Goal 3 - Progress (Week 1): Progressing toward goal OT Short Term Goal 4 (Week 1): Patient will thread 1 LE through LB clothing in circle sitting with use of compensatory technique in prep for completion of LB BADLs. OT Short Term Goal 4 - Progress (Week 1): Progressing toward goal Week 2:  OT Short Term Goal 1 (Week 2): Patient will thread 1 LE through LB clothing in circle sitting with use of compensatory technique in prep for completion of LB BADLs. OT Short Term Goal 1 - Progress (Week 2): Met OT Short Term Goal 2 (Week 2): Patient will demonstrate squat-pivot transfers with Max A. OT Short Term Goal 2 - Progress (Week 2): Progressing toward goal OT Short Term Goal 3 (Week 2): Pt will don pull over shirt with min A OT Short Term Goal 3 - Progress (Week 2): Met OT Short Term Goal 4 (Week 2): Pt perform toileting tasks with max A OT Short Term Goal 4 - Progress (Week 2): Met Week 3:  OT Short Term Goal 1 (Week 3): Patient will demonstrate squat-pivot transfers with Max A. OT Short Term Goal 1 - Progress (Week 3): Progressing toward goal OT Short Term Goal 2 (Week 3): Pt will maintain dynamic sitting balance EOB/EOM with mod A during functional activities OT Short Term Goal 2 - Progress (Week 3): Met OT Short Term Goal 3 (Week 3):  Pt will complete toilting tasks with mod A OT Short Term Goal 3 - Progress (Week 3): Progressing toward goal Week 4:  OT Short Term Goal 1 (Week 4): Patient will demonstrate squat-pivot transfers with Max A OT Short Term Goal 2 (Week 4): Pt will maintain dynamic sitting balance EOB/EOM with Min A during functional activities OT Short Term Goal 3 (Week 4): Pt will complete toilting tasks with mod A      Skilled Therapeutic Interventions/Progress Updates:    Pt received in bed and agreeable to therapy.  From bed level, pt worked on rolling in bed with only min A and cues for visual fixation prior to rolling to avoid dizziness.  Pt reported this technique did seem to be helping.   From bed level worked on LB self care with pt washing part of the upper half of her lower body. Pt assisting with LB dressing by actively moving LLE.    With +2 assist for safety , pt sat to EOB with max A and was able to sit statically with close S to CGA with BUE support. Used board to move to wc with total A.   Once in power chair, min A to move chair to sink where pt completed her UB self care b/d with  Supervision.  She then mobilized w/c with mod A to guide chair to align next to bed.  Pt resting in wc with belt alarm on and all needs met.   Therapy Documentation Precautions:  Precautions Precautions: Fall, Cervical Precaution Booklet Issued: Yes (comment) Precaution Comments: reviewed precautions Required Braces or Orthoses: Cervical Brace Cervical Brace: Soft collar, At all times Restrictions Weight Bearing Restrictions: No     Pain: Pain Assessment Pain Scale: 0-10 Pain Score: 0-No pain    Therapy/Group: Individual Therapy  [place] 09/27/2019, 10:33 AM

## 2019-09-27 NOTE — Progress Notes (Addendum)
[place] PHYSICAL MEDICINE & REHABILITATION PROGRESS NOTE   Subjective/Complaints:   Pt reports slept OK- bowel program went well- had large BM during latter part of bowel program.  Has UTI- large leuks, (-) nitrites; WBC>50- will start Keflex  ROS:   Pt denies SOB, abd pain, CP, N/V/C/D, and vision changes  Objective:   No results found. No results for input(s): WBC, HGB, HCT, PLT in the last 72 hours. No results for input(s): NA, K, CL, CO2, GLUCOSE, BUN, CREATININE, CALCIUM in the last 72 hours.  Intake/Output Summary (Last 24 hours) at 09/27/2019 0846 Last data filed at 09/27/2019 0700 Gross per 24 hour  Intake 250 ml  Output --  Net 250 ml     Physical Exam: Vital Signs Blood pressure (!) 119/42, pulse 61, temperature 98 F (36.7 C), resp. rate 18, height 5\' 5"  (1.651 m), weight 62.9 kg, SpO2 95 %. Constitutional: sitting up in bed- eating cheerios, appropriate, NAD HEENT: EOMI, oral membranes moist Neck: supple Cardiovascular: borderline bradycardia- regular rhythm Respiratory/Chest: CTA B/L- no W/R/R- good air movement GI/Abdomen: Soft, NT, ND, (+)BS   Ext: no clubbing, cyanosis, or edema Psych: flat affect Skin: < 2 cm- open- oval shaped- just the top layer gone, but very superficial - between buttocks- in crease- Stage II. -cannot assess due to eating  Neurologic: spasms seen, RLE>LLE- was causing pt pain- occurred x1 RLE- 0 to tr/5 Musculoskeletal: No pain with active assisted as well as passive range of motion in the upper lower limb no joint swelling. Right heel a little sore, skin intact as above Sensation intact throughout    Assessment/Plan: 1. Functional deficits secondary to tetraparesis secondary to cervical myelopathy which require 3+ hours per day of interdisciplinary therapy in a comprehensive inpatient rehab setting.  Physiatrist is providing close team supervision and 24 hour management of active medical problems listed below.  Physiatrist  and rehab team continue to assess barriers to discharge/monitor patient progress toward functional and medical goals  Care Tool:  Bathing    Body parts bathed by patient: Right arm, Left arm, Chest, Abdomen, Front perineal area, Right upper leg, Left upper leg, Right lower leg, Face   Body parts bathed by helper: Buttocks, Left lower leg, Right lower leg     Bathing assist Assist Level: Moderate Assistance - Patient 50 - 74%     Upper Body Dressing/Undressing Upper body dressing   What is the patient wearing?: Pull over shirt    Upper body assist Assist Level: Supervision/Verbal cueing    Lower Body Dressing/Undressing Lower body dressing      What is the patient wearing?: Pants     Lower body assist Assist for lower body dressing: Dependent - Patient 0% (supine rolling secondary to time)     Toileting Toileting    Toileting assist Assist for toileting: Maximal Assistance - Patient 25 - 49%     Transfers Chair/bed transfer  Transfers assist     Chair/bed transfer assist level: 2 Helpers (slide board)     Locomotion Ambulation   Ambulation assist   Ambulation activity did not occur: Safety/medical concerns          Walk 10 feet activity   Assist  Walk 10 feet activity did not occur: Safety/medical concerns        Walk 50 feet activity   Assist Walk 50 feet with 2 turns activity did not occur: Safety/medical concerns         Walk 150 feet activity  Assist Walk 150 feet activity did not occur: Safety/medical concerns         Walk 10 feet on uneven surface  activity   Assist Walk 10 feet on uneven surfaces activity did not occur: Safety/medical concerns         Wheelchair     Assist Will patient use wheelchair at discharge?: Yes Type of Wheelchair: Power    Wheelchair assist level: Supervision/Verbal cueing Max wheelchair distance: 200    Wheelchair 50 feet with 2 turns activity    Assist        Assist  Level: Supervision/Verbal cueing   Wheelchair 150 feet activity     Assist      Assist Level: Supervision/Verbal cueing   Blood pressure (!) 119/42, pulse 61, temperature 98 F (36.7 C), resp. rate 18, height 5\' 5"  (1.651 m), weight 62.9 kg, SpO2 95 %.  Medical Problem List and Plan: 1.   Tetraparesis secondary to cervical myelopathy -patient may  shower -ELOS/Goals: 20 to 24 days  -Continue CIR 2. Antithrombotics: -DVT/anticoagulation:Mechanical:Sequential compression devices, below kneeBilateral lower extremities. Bilateral lower extremity DVTs, age indeterminate, on . Started on Eliquis.   8/9- needs at least 3 months -antiplatelet therapy: N/A 3. Pain Management:Well controlled with prn Tylenol 4. Mood:LCSW to follow for evaluation and support. -antipsychotic agents: N/A 5. Neuropsych: This patient is capable of making decisions on her own behalf. 6. Skin/Wound Care:Stage II sacral ulcer-Monitor wound for healing. Will add protein supplement and multivitamin to promote healing.  8/20- stage II sacral ulcer- looking stable  -continue turning and local care  8/24- no significant change- staying off backside as much as possible.  8/25- consulted WOC -they think due to MASD- in gluteal fold at coccyx- started Aquacel with foam dressing  -PRAFO's for heels 7. Fluids/Electrolytes/Nutrition:Monitor I/O. Check lytes in am.  8. HTN: Monitor BP tid--continue HCTZ daily.Check BMET/K+ level in am.  8/20- Off HCTZ- DBP in 40s, but SBP well controlled  8/22 bp controlled 9. Post op labs: Pre-op K+ low normal.   8/20- K+ 3.5- labs q Monday  8/23- K+ 3.7- doing better 10 Neurogenic bladder: Has been incontinent with external catheter--will monitor voiding with PVRs/bladder scan. UA negative. Continue Toviaz 4mg  daily  8/14: Continues to have urgency, no dysuria. Has sensation of voiding.   8/20- still has 50% of time  bladder incontinence- will send to Neuro-Urology after d/c.   8/24- urinary retention occurring- will check U/A and Cx since is new- likely has forming UTI.  11.Neurogenic bowel: KUB shows large stool burden and no obstruction. Had BM with enema on admission. On daily Miralax, colace TID, Senna HS.  bisacodyl 10mg  supp qpm  8/20- reduced Miralax to daily to help with loose stools- still getting bowel program nightly with lidocaine  8/25- good BM with bowel program last night 12. Insomnia: Trazodone 100mg  did not provide benefit Try Amitriptyline 25mg .   8/15: Has been helping but she slept poorly last night.  13. Bradycardia  8/20-21- asymptomatic- running low 60s- con't to monitor 14. Nausea  8/16- give prns meds 15. Sleepiness  8/18- has been really sedated last 2 days- afebrile- doesn't have any new meds- no Sx's of UTI  8/19-21 appears generally improved 16. Vertigo  8/24- will order PT evaluation for vertigo- sounds like Benign positional vertigo  8/25- will eval, but might not be able to treat due to cervical precautions- FYI 17. Urinary retention/UTI-  8/25- check U/A and Cx  8/26- U/A (+) for UTI- will  start Keflex 500 mg TID for now and await Cx results 18. Dispo  8/19- pt said daughters cannot care for her, but doesn't want to go to SNF- will discuss with pt/daughter this afternoon.   8/20- spoke with pt and daughter for additional 25 minutes- so total care 40 minutes today- as detailed above.   8/26- family plans on taking pt home.     Pt is a tetraplegic- incomplete quadriplegia- with Stage II pressure ulcer and neurogenic bowel and bladder and spasticity- she needs a power w/c to help her do pressure relief since she cannot do in manual w/c- also unable to propel a manual w/c at all- also needs to help her transfer and allow her family to care for her; needs power w/c to get from 1 place to another- she also would benefit from elevating leg rests due to variable LE edema. She  REQUIRES a ROHO w/c cushion due to current Stage II pressure ulcer, and needs to do pressure relief every 15 minutes while in the w/c.  Pt also NEEDS a [place] lift to transfer her- cannot transfer via transfer board at this time- she requires too much help and so needs [employer] lift.  Due to stage II pressure ulcer AND being tetraplegic, pt needs hospital bed to help her with turning at home, needs the rails, and to elevate her to help get her in/out of w/c, being level- she is unable to transfer from a regular bed at this time.    LOS: 24 days A FACE TO FACE EVALUATION WAS PERFORMED  Jonia Oakey 09/27/2019, 8:46 AM

## 2019-09-27 NOTE — Progress Notes (Signed)
Physical Therapy Session Note  Patient Details  Name: Haley Robinson MRN: [id number] Date of Birth: March 11, 1940  Today's Date: 09/27/2019 PT Individual Time: 1300-1400 PT Individual Time Calculation (min): 60 min   Short Term Goals: Week 4:  PT Short Term Goal 1 (Week 4): =LTG due to ELOS  Skilled Therapeutic Interventions/Progress Updates:    Pt received seated in bed, agreeable to PT session. No complaints of pain. Pt noted to have lateral lean to the R, able to correct posture with use of UE on bedrail but unable to maintain posture with onset of fatigue. Supine to sit with mod A for BLE management, pt able to elevate trunk with use of BUE this date. Pt able to maintain static sitting balance EOB x 5 min with use of BUE. Pt returned to supine with mod A for BLE management. Supine to sit again with max A required for BLE management and trunk elevation due to pt fatigue. Slide board transfer to power w/c with assist x 2 for safety. Power w/c mobility x 150 ft at Supervision level. Pt exhibits improved control with steering with use of built-up joystick vs goalpost handle. Reviewed power w/c controls including tilt and recline. Pt left semi-reclined in PWC in room with needs in reach, seatbelt in place at end of session.  Therapy Documentation Precautions:  Precautions Precautions: Fall, Cervical Precaution Booklet Issued: Yes (comment) Precaution Comments: reviewed precautions Required Braces or Orthoses: Cervical Brace Cervical Brace: Soft collar, At all times Restrictions Weight Bearing Restrictions: No    Therapy/Group: Individual Therapy   Peter Congo, PT, DPT  09/27/2019, 3:55 PM

## 2019-09-28 ENCOUNTER — Inpatient Hospital Stay (HOSPITAL_COMMUNITY): Payer: Medicare PPO | Admitting: Physical Therapy

## 2019-09-28 ENCOUNTER — Ambulatory Visit (HOSPITAL_COMMUNITY): Payer: Medicare PPO | Admitting: Physical Therapy

## 2019-09-28 ENCOUNTER — Inpatient Hospital Stay (HOSPITAL_COMMUNITY): Payer: Medicare PPO | Admitting: Occupational Therapy

## 2019-09-28 LAB — URINE CULTURE: Culture: >=100000 — AB

## 2019-09-28 MED ORDER — APIXABAN 5 MG PO TABS: 5.0000 mg | ORAL_TABLET | Freq: Two times a day (BID) | ORAL | 0 refills | Status: DC

## 2019-09-28 MED ORDER — ACETAMINOPHEN 325 MG PO TABS: 325.0000 mg | ORAL_TABLET | ORAL | Status: AC | PRN

## 2019-09-28 MED ORDER — AMITRIPTYLINE HCL 25 MG PO TABS: 25.0000 mg | ORAL_TABLET | Freq: Every day | ORAL | 0 refills | Status: AC

## 2019-09-28 MED ORDER — CEPHALEXIN 500 MG PO CAPS: 500.0000 mg | ORAL_CAPSULE | Freq: Two times a day (BID) | ORAL | 0 refills | Status: DC

## 2019-09-28 MED ORDER — TRAZODONE HCL 100 MG PO TABS: 100.0000 mg | ORAL_TABLET | Freq: Every day | ORAL | 0 refills | Status: AC

## 2019-09-28 MED ORDER — POLYETHYLENE GLYCOL 3350 17 G PO PACK: 17.0000 g | PACK | Freq: Every day | ORAL | 0 refills | Status: AC

## 2019-09-28 MED ORDER — SENNA 8.6 MG PO TABS: 2.0000 | ORAL_TABLET | Freq: Every day | ORAL | 0 refills | Status: AC

## 2019-09-28 MED ORDER — ASCORBIC ACID 500 MG PO TABS: 500.0000 mg | ORAL_TABLET | Freq: Two times a day (BID) | ORAL | 0 refills | Status: AC

## 2019-09-28 MED ORDER — CYCLOBENZAPRINE HCL 10 MG PO TABS: 10.0000 mg | ORAL_TABLET | Freq: Three times a day (TID) | ORAL | 0 refills | Status: AC | PRN

## 2019-09-28 MED ORDER — ASPIRIN 81 MG PO TBEC: 81.0000 mg | DELAYED_RELEASE_TABLET | Freq: Every day | ORAL | 0 refills | Status: AC

## 2019-09-28 MED ORDER — CEPHALEXIN 250 MG PO CAPS
500.0000 mg | ORAL_CAPSULE | Freq: Two times a day (BID) | ORAL | Status: DC
  Administered 2019-09-28 – 2019-10-03 (×10): 500 mg via ORAL
  Filled 2019-09-28 (×10): qty 2

## 2019-09-28 MED ORDER — MELATONIN 5 MG PO TABS: 5.0000 mg | ORAL_TABLET | Freq: Every evening | ORAL | 0 refills | Status: AC | PRN

## 2019-09-28 MED ORDER — FESOTERODINE FUMARATE ER 4 MG PO TB24: 4.0000 mg | ORAL_TABLET | Freq: Every day | ORAL | 0 refills | Status: AC

## 2019-09-28 MED ORDER — SIMVASTATIN 40 MG PO TABS: 40.0000 mg | ORAL_TABLET | Freq: Every day | ORAL | 0 refills | Status: AC

## 2019-09-28 MED FILL — CEPHALEXIN 500 MG CAPS: 500 | 6 days supply | Qty: 12 | Fill #0

## 2019-09-28 MED FILL — SM CLEARLAX POWDER: 17 | 30 days supply | Qty: 510 | Fill #0

## 2019-09-28 MED FILL — CYCLOBENZAPRINE 10 MG TAB: 10 | 15 days supply | Qty: 45 | Fill #0

## 2019-09-28 MED FILL — SENNA 8.6 MG TABS: 8.6 | 30 days supply | Qty: 60 | Fill #0

## 2019-09-28 MED FILL — AMITRIPTYLINE HCL 25 MG TAB: 25 | 30 days supply | Qty: 30 | Fill #0

## 2019-09-28 MED FILL — ELIQUIS 5 MG TABLET: 5 | 30 days supply | Qty: 60 | Fill #0

## 2019-09-28 MED FILL — ASPIRIN LOW DOSE 81 MG TBEC: 81 | 30 days supply | Qty: 30 | Fill #0

## 2019-09-28 MED FILL — MELATONIN 5 MG TABS: 5 | 30 days supply | Qty: 30 | Fill #0

## 2019-09-28 MED FILL — traZODone HCL 100 MG TABS: 100 | 30 days supply | Qty: 30 | Fill #0

## 2019-09-28 MED FILL — TOVIAZ ER 4 MG TABLET: 4 | 30 days supply | Qty: 30 | Fill #0

## 2019-09-28 MED FILL — VITAMIN C 500 MG TABLET: 500 | 30 days supply | Qty: 60 | Fill #0

## 2019-09-28 NOTE — Progress Notes (Signed)
[place] PHYSICAL MEDICINE & REHABILITATION PROGRESS NOTE   Subjective/Complaints:   Pt reports hasn't needed cathing in last 24 hours- with starting new ABX.   Also said bowel program OK, but the documentation says only smear last night-   Poor sleep "for some reason".  Need to look at Urine Cx this weekend to make sur eon good ABX.   ROS:   Pt denies SOB, abd pain, CP, N/V/C/D, and vision changes   Objective:   No results found. No results for input(s): WBC, HGB, HCT, PLT in the last 72 hours. No results for input(s): NA, K, CL, CO2, GLUCOSE, BUN, CREATININE, CALCIUM in the last 72 hours.  Intake/Output Summary (Last 24 hours) at 09/28/2019 0920 Last data filed at 09/28/2019 0700 Gross per 24 hour  Intake 322 ml  Output --  Net 322 ml     Physical Exam: Vital Signs Blood pressure 114/60, pulse 63, temperature 98 F (36.7 C), temperature source Oral, resp. rate 16, height 5\' 5"  (1.651 m), weight 62.9 kg, SpO2 95 %. Constitutional: turned to side in bed; laying down; finished breakfast, appropriate, NAD HEENT: EOMI, oral membranes moist Neck: supple Cardiovascular: RRR Respiratory/Chest: CTA B/L- no W/R/R- good air movement GI/Abdomen: soft, NT, ND, (+) hypoactive BS   Ext: no clubbing, cyanosis, or edema Psych: flat affect- slightly better this AM Skin: < 2 cm- open- oval shaped- just the top layer gone, but very superficial - between buttocks- in crease- Stage II. -cannot assess due to eating  Neurologic: spasms seen, RLE>LLE- was causing pt pain- occurred x1 RLE- 0 to tr/5 Musculoskeletal: No pain with active assisted as well as passive range of motion in the upper lower limb no joint swelling. Right heel a little sore, skin intact as above Sensation intact throughout    Assessment/Plan: 1. Functional deficits secondary to tetraparesis secondary to cervical myelopathy which require 3+ hours per day of interdisciplinary therapy in a comprehensive inpatient  rehab setting.  Physiatrist is providing close team supervision and 24 hour management of active medical problems listed below.  Physiatrist and rehab team continue to assess barriers to discharge/monitor patient progress toward functional and medical goals  Care Tool:  Bathing    Body parts bathed by patient: Right arm, Left arm, Chest, Abdomen, Front perineal area, Right upper leg, Left upper leg, Right lower leg, Face   Body parts bathed by helper: Buttocks, Right lower leg, Left lower leg     Bathing assist Assist Level: Moderate Assistance - Patient 50 - 74%     Upper Body Dressing/Undressing Upper body dressing   What is the patient wearing?: Pull over shirt    Upper body assist Assist Level: Supervision/Verbal cueing    Lower Body Dressing/Undressing Lower body dressing      What is the patient wearing?: Pants     Lower body assist Assist for lower body dressing: Dependent - Patient 0%     Toileting Toileting    Toileting assist Assist for toileting: Maximal Assistance - Patient 25 - 49%     Transfers Chair/bed transfer  Transfers assist     Chair/bed transfer assist level: 2 Helpers     Locomotion Ambulation   Ambulation assist   Ambulation activity did not occur: Safety/medical concerns          Walk 10 feet activity   Assist  Walk 10 feet activity did not occur: Safety/medical concerns        Walk 50 feet activity   Assist Walk  50 feet with 2 turns activity did not occur: Safety/medical concerns         Walk 150 feet activity   Assist Walk 150 feet activity did not occur: Safety/medical concerns         Walk 10 feet on uneven surface  activity   Assist Walk 10 feet on uneven surfaces activity did not occur: Safety/medical concerns         Wheelchair     Assist Will patient use wheelchair at discharge?: Yes Type of Wheelchair: Power    Wheelchair assist level: Supervision/Verbal cueing Max wheelchair  distance: 150'    Wheelchair 50 feet with 2 turns activity    Assist        Assist Level: Supervision/Verbal cueing   Wheelchair 150 feet activity     Assist      Assist Level: Supervision/Verbal cueing   Blood pressure 114/60, pulse 63, temperature 98 F (36.7 C), temperature source Oral, resp. rate 16, height 5\' 5"  (1.651 m), weight 62.9 kg, SpO2 95 %.  Medical Problem List and Plan: 1.   Tetraparesis secondary to cervical myelopathy -patient may  shower -ELOS/Goals: 20 to 24 days  -Continue CIR 2. Antithrombotics: -DVT/anticoagulation:Mechanical:Sequential compression devices, below kneeBilateral lower extremities. Bilateral lower extremity DVTs, age indeterminate, on . Started on Eliquis.   8/9- needs at least 3 months -antiplatelet therapy: N/A 3. Pain Management:Well controlled with prn Tylenol 4. Mood:LCSW to follow for evaluation and support. -antipsychotic agents: N/A 5. Neuropsych: This patient is capable of making decisions on her own behalf. 6. Skin/Wound Care:Stage II sacral ulcer-Monitor wound for healing. Will add protein supplement and multivitamin to promote healing.  8/20- stage II sacral ulcer- looking stable  -continue turning and local care  8/24- no significant change- staying off backside as much as possible.  8/25- consulted WOC -they think due to MASD- in gluteal fold at coccyx- started Aquacel with foam dressing  -PRAFO's for heels 7. Fluids/Electrolytes/Nutrition:Monitor I/O. Check lytes in am.  8. HTN: Monitor BP tid--continue HCTZ daily.Check BMET/K+ level in am.  8/20- Off HCTZ- DBP in 40s, but SBP well controlled  8/22 bp controlled 9. Post op labs: Pre-op K+ low normal.   8/20- K+ 3.5- labs q Monday  8/23- K+ 3.7- doing better  8/27- labs Monday  10 Neurogenic bladder: Has been incontinent with external catheter--will monitor voiding with PVRs/bladder scan. UA  negative. Continue Toviaz 4mg  daily  8/14: Continues to have urgency, no dysuria. Has sensation of voiding.   8/20- still has 50% of time bladder incontinence- will send to Neuro-Urology after d/c.   8/24- urinary retention occurring- will check U/A and Cx since is new- likely has forming UTI.   8/27- no urinary retention since started Keflex- no in/out caths required in last 24 hours 11.Neurogenic bowel: KUB shows large stool burden and no obstruction. Had BM with enema on admission. On daily Miralax, colace TID, Senna HS.  bisacodyl 10mg  supp qpm  8/20- reduced Miralax to daily to help with loose stools- still getting bowel program nightly with lidocaine  8/25- good BM with bowel program last night  8/27- only smear last night- if no good BM tonight, needs some Sorbitol 12. Insomnia: Trazodone 100mg  did not provide benefit Try Amitriptyline 25mg .   8/27- poor sleep last night- con't regimen 13. Bradycardia  8/20-21- asymptomatic- running low 60s- con't to monitor 14. Nausea  8/16- give prns meds 15. Sleepiness  8/18- has been really sedated last 2 days- afebrile- doesn't have any  new meds- no Sx's of UTI  8/19-21 appears generally improved 16. Vertigo  8/24- will order PT evaluation for vertigo- sounds like Benign positional vertigo  8/25- will eval, but might not be able to treat due to cervical precautions- FYI 17. Urinary retention/UTI-  8/25- check U/A and Cx  8/26- U/A (+) for UTI- will start Keflex 500 mg TID for now and await Cx results  8/27- 100K E Coli- Cx pending for sensitivities- needs to look 18. Dispo  8/19- pt said daughters cannot care for her, but doesn't want to go to SNF- will discuss with pt/daughter this afternoon.   8/20- spoke with pt and daughter for additional 25 minutes- so total care 40 minutes today- as detailed above.   8/26- family plans on taking pt home.     Pt is a tetraplegic- incomplete quadriplegia- with Stage II pressure ulcer and neurogenic  bowel and bladder and spasticity- she needs a power w/c to help her do pressure relief since she cannot do in manual w/c- also unable to propel a manual w/c at all- also needs to help her transfer and allow her family to care for her; needs power w/c to get from 1 place to another- she also would benefit from elevating leg rests due to variable LE edema. She REQUIRES a ROHO w/c cushion due to current Stage II pressure ulcer, and needs to do pressure relief every 15 minutes while in the w/c.  Pt also NEEDS a Hoyer lift to transfer her- cannot transfer via transfer board at this time- she requires too much help and so needs [employer] lift.  Due to stage II pressure ulcer AND being tetraplegic, pt needs hospital bed to help her with turning at home, needs the rails, and to elevate her to help get her in/out of w/c, being level- she is unable to transfer from a regular bed at this time.    LOS: 25 days A FACE TO FACE EVALUATION WAS PERFORMED  Haley Robinson 09/28/2019, 9:20 AM

## 2019-09-28 NOTE — Progress Notes (Signed)
Occupational Therapy Session Note  Patient Details  Name: Haley Robinson MRN: [id number] Date of Birth: Jul 20, 1940  Today's Date: 09/28/2019 OT Individual Time: 1303-1406 OT Individual Time Calculation (min): 63 min    Short Term Goals: Week 4:  OT Short Term Goal 1 (Week 4): Patient will demonstrate squat-pivot transfers with Max A OT Short Term Goal 2 (Week 4): Pt will maintain dynamic sitting balance EOB/EOM with Min A during functional activities OT Short Term Goal 3 (Week 4): Pt will complete toilting tasks with mod A  Skilled Therapeutic Interventions/Progress Updates:  Patient and family met at bedside in agreement with family education session with focus on functional transfers. Daughter present at bedside education on safe body mechanics, assisting patient with bed mobility, and use of manual hoyer lift for transfers. Daughter able to assist patient to sitting EOB with +2 assist from OT. OT also provided education on providing assist to maintain static sitting balance. Discussion on difference between slideboard transfers and hoyer lift transfers with daughter practicing both. Daughter to provide picture of home set-up at next session to problem solve placement items in sunroom. Daughter with questions pertaining to start of [facility] services. OT made LCSW aware. Session concluded with patient seated in [place] with call bell within reach and all needs met.   Therapy Documentation Precautions:  Precautions Precautions: Fall, Cervical Precaution Booklet Issued: Yes (comment) Precaution Comments: reviewed precautions Required Braces or Orthoses: Cervical Brace Cervical Brace: Soft collar, At all times Restrictions Weight Bearing Restrictions: No General:    Therapy/Group: Individual Therapy  Burnis Halling R [place]Davis 09/28/2019, 12:32 PM

## 2019-09-28 NOTE — Progress Notes (Signed)
Physical Therapy Session Note  Patient Details  Name: Haley Robinson MRN: [id number] Date of Birth: Jun 11, 1940  Today's Date: 09/28/2019 PT Individual Time: 0900-0930 PT Individual Time Calculation (min): 30 min   Short Term Goals: Week 4:  PT Short Term Goal 1 (Week 4): =LTG due to ELOS  Skilled Therapeutic Interventions/Progress Updates:    Pt received seated in bed, agreeable to PT session. No complaints of pain. Pt found to have soiled bedsheets, assisted pt with brief change and pericare with min A for rolling L/R with use of bedrails. Assisted pt with dependently donning knee-high TEDs, pants, and shoes. Pt able to come to long-sitting position in bed with HOB elevated, use of BUE on bedrails, and mod A for trunk control. While in long-sitting position assisted pt with doffing shirt and donning new shirt. Pt able to assist with UB dressing with use of BUE with trunk resting against HOB. Unable to transfer pt up to w/c this session due to time constraints and lack of +2 assistance. Pt left in R sidelying in bed with needs in reach, bed alarm in place at end of session.  Therapy Documentation Precautions:  Precautions Precautions: Fall, Cervical Precaution Booklet Issued: Yes (comment) Precaution Comments: reviewed precautions Required Braces or Orthoses: Cervical Brace Cervical Brace: Soft collar, At all times Restrictions Weight Bearing Restrictions: No    Therapy/Group: Individual Therapy   Peter Congo, PT, DPT  09/28/2019, 12:12 PM

## 2019-09-28 NOTE — Plan of Care (Signed)
  Problem: Consults Goal: RH SPINAL CORD INJURY PATIENT EDUCATION Description:  See Patient Education module for education specifics.  Outcome: Progressing Goal: Skin Care Protocol Initiated - if Braden Score 18 or less Description: If consults are not indicated, leave blank or document N/A Outcome: Progressing   Problem: SCI BOWEL ELIMINATION Goal: RH STG MANAGE BOWEL WITH ASSISTANCE Description: STG Manage Bowel with mod Assistance. Outcome: Progressing Goal: RH STG SCI MANAGE BOWEL WITH MEDICATION WITH ASSISTANCE Description: STG SCI Manage bowel with medication with min/mod assistance. Outcome: Progressing   Problem: SCI BLADDER ELIMINATION Goal: RH STG MANAGE BLADDER WITH ASSISTANCE Description: STG Manage Bladder With mod Assistance Outcome: Progressing   Problem: RH SKIN INTEGRITY Goal: RH STG SKIN FREE OF INFECTION/BREAKDOWN Description: Pt will be free of skin breakdown/infection prior to DC with mod/max assist Outcome: Progressing Goal: RH STG MAINTAIN SKIN INTEGRITY WITH ASSISTANCE Description: STG Maintain Skin Integrity With mod/max Assistance. Outcome: Progressing   Problem: RH SAFETY Goal: RH STG ADHERE TO SAFETY PRECAUTIONS W/ASSISTANCE/DEVICE Description: STG Adhere to Safety Precautions With cues and reminders Assistance/Device. Outcome: Progressing

## 2019-09-28 NOTE — Progress Notes (Signed)
Physical Therapy Session Note  Patient Details  Name: Haley Robinson MRN: [id number] Date of Birth: 1940/05/12  Today's Date: 09/28/2019 PT Individual Time: 1415-1515 and 1100-1200 PT Individual Time Calculation (min): 60 min and 60 min  Short Term Goals: Week 1:  PT Short Term Goal 1 (Week 1): Pt will perform rolling with min A consistently PT Short Term Goal 1 - Progress (Week 1): Met PT Short Term Goal 2 (Week 1): Pt will perform bed mobility with assist x 1 consistently PT Short Term Goal 2 - Progress (Week 1): Progressing toward goal PT Short Term Goal 3 (Week 1): Pt will maintain static sitting balance x 5 min with mod A PT Short Term Goal 3 - Progress (Week 1): Met Week 2:  PT Short Term Goal 1 (Week 2): Pt will perform least restricive transfer with assist x 1 consistently PT Short Term Goal 1 - Progress (Week 2): Progressing toward goal PT Short Term Goal 2 (Week 2): Pt will perform w/c mobility x 50 ft with LRAD PT Short Term Goal 2 - Progress (Week 2): Met PT Short Term Goal 3 (Week 2): Pt will perform bed mobility with assist x 1 consistently PT Short Term Goal 3 - Progress (Week 2): Met Week 3:  PT Short Term Goal 1 (Week 3): Pt will demonstrate ability to perform pressure relief independently via [place] or request assist with pressure relief when in TIS PT Short Term Goal 1 - Progress (Week 3): Progressing toward goal PT Short Term Goal 2 (Week 3): Pt will maintain static sitting balance with close Supervision x 5 min PT Short Term Goal 2 - Progress (Week 3): Met PT Short Term Goal 3 (Week 3): Pt will perform least restrictive transfer with assist x 1 PT Short Term Goal 3 - Progress (Week 3): Progressing toward goal Week 4:  PT Short Term Goal 1 (Week 4): =LTG due to ELOS  Skilled Therapeutic Interventions/Progress Updates:   Session 1: pt received in bed and agreeable to therapy. Pt directed in rolling to R and L for improved pant positioning mod A, supine>sit with log  rolling and (+) use of bed rails max A. Pt had LOB to R from sitting EOB and required mod A to correct, max A for scooting anteriorly for improved BLE placement on floor, once here this improved her balance statically to CGA. Pt directed in trunk lean to R for slide board placement, min A to achieve and return to midline. Pt sat EOB for several minutes total to setup for transfer at CGA, no LOB noted. Pt directed in slide board transfer to her L to [place] at mod A x1 with VC for technique with hip/shoulders relationship for scooting and manual assist for BLE repositioning. Pt required max A x1 for scooting posteriorly in WC for improved positioning once there. Pt directed in [place] mobility in hallway and x3 for 3pt turning and stopping in small areas at supervision. Pt directed in all controls of PWC and able to recall pressure relief schedule and how to do this. Pt reported his type of control stick is much better for her and she feels she can control the chair better with it. Pt directed in leaning to R and L in [place] for improved positioning in chair, scooting forward and scooting backward 2x10 each direction with mod A-max A to complete. Pt directed in [facility] mobility to return to room in [facility], supervision and success with turning in room and parking chair. Left in  chair, alarm belt on, chair off, and All needs in reach and in good condition. Call light in hand.    Session 2: pt received in [place] and agreeable to therapy daughter present for family training. Pt and pt's daughter had multiple questions about equipment for home, recommendations with HHPT, [place] nursing, and training physically with bed mobility and transfers. Pt's daughter reports he has been training with hoyer lift and slide board in previous session and reports she feels good about this, understanding slide board is to work with HHPT and for pt to improve with and at this time we are recommending hoyer lift for family to use for pt's transfers. Pt also  reports understanding. Pt's daughter and pt walked through PROM hand out and demonstrated how to do all exercises on the handout, daughter also participated in all, demonstrated good technique and ability to understand sheet and instructions. Pt and pt's daughter also educated on safety with mobility at home with [place], level of supervision and assist recommended at this time, pt asked about car transfer safety and educated this would not be safe to attempt with slide board or hoyer at this time. They both understand. At end of session, pt and pt's daughter agreeable and understanding of all family education discussed and agrees to DC plan. Pt left in [place], daughter present, All needs in reach and in good condition. Call light in hand.    Therapy Documentation Precautions:  Precautions Precautions: Fall, Cervical Precaution Booklet Issued: Yes (comment) Precaution Comments: reviewed precautions Required Braces or Orthoses: Cervical Brace Cervical Brace: Soft collar, At all times Restrictions Weight Bearing Restrictions: No   Therapy/Group: Individual Therapy  Junie Panning 09/28/2019, 3:50 PM

## 2019-09-28 NOTE — Progress Notes (Signed)
Pt incontinent of small, loose bm. Brief changed and peri care given.

## 2019-09-28 NOTE — Progress Notes (Signed)
Pt suppository given and bowel stimulation performed. Pt tolerated well. No stool noted to glove.

## 2019-09-29 ENCOUNTER — Ambulatory Visit (HOSPITAL_COMMUNITY): Payer: Medicare PPO | Admitting: Physical Therapy

## 2019-09-29 ENCOUNTER — Encounter (HOSPITAL_COMMUNITY): Payer: Medicare PPO

## 2019-09-29 NOTE — Progress Notes (Signed)
[facility] PHYSICAL MEDICINE & REHABILITATION PROGRESS NOTE   Subjective/Complaints: UC sensitive to all abx She has no complaints Would like to to get changed- OT will be at 10 She denies SOB  ROS:   Pt denies SOB, abd pain, CP, N/V/C/D, and vision changes   Objective:   No results found. No results for input(s): WBC, HGB, HCT, PLT in the last 72 hours. No results for input(s): NA, K, CL, CO2, GLUCOSE, BUN, CREATININE, CALCIUM in the last 72 hours.  Intake/Output Summary (Last 24 hours) at 09/29/2019 0857 Last data filed at 09/28/2019 1700 Gross per 24 hour  Intake 360 ml  Output --  Net 360 ml     Physical Exam: Vital Signs Blood pressure (!) 135/50, pulse 72, temperature 97.6 F (36.4 C), resp. rate 18, height 5\' 5"  (1.651 m), weight 61.2 kg, SpO2 94 %. General: Alert and oriented x 3, No apparent distress HEENT: Head is normocephalic, atraumatic, PERRLA, EOMI, sclera anicteric, oral mucosa pink and moist, dentition intact, ext ear canals clear,  Neck: Supple without JVD or lymphadenopathy Heart: Reg rate and rhythm. No murmurs rubs or gallops Chest: CTA bilaterally without wheezes, rales, or rhonchi; no distress Abdomen: Soft, non-tender, non-distended, bowel sounds positive. Extremities: No clubbing, cyanosis, or edema. Pulses are 2+ Psych: flat affect- slightly better this AM Skin: < 2 cm- open- oval shaped- just the top layer gone, but very superficial - between buttocks- in crease- Stage II. -cannot assess due to eating  Neurologic: spasms seen, RLE>LLE- was causing pt pain- occurred x1 RLE- 0 to tr/5 Musculoskeletal: No pain with active assisted as well as passive range of motion in the upper lower limb no joint swelling. Right heel a little sore, skin intact as above Sensation intact throughout     Assessment/Plan: 1. Functional deficits secondary to tetraparesis secondary to cervical myelopathy which require 3+ hours per day of interdisciplinary therapy in a  comprehensive inpatient rehab setting.  Physiatrist is providing close team supervision and 24 hour management of active medical problems listed below.  Physiatrist and rehab team continue to assess barriers to discharge/monitor patient progress toward functional and medical goals  Care Tool:  Bathing    Body parts bathed by patient: Right arm, Left arm, Chest, Abdomen, Front perineal area, Right upper leg, Left upper leg, Right lower leg, Face   Body parts bathed by helper: Buttocks, Right lower leg, Left lower leg     Bathing assist Assist Level: Moderate Assistance - Patient 50 - 74%     Upper Body Dressing/Undressing Upper body dressing   What is the patient wearing?: Pull over shirt    Upper body assist Assist Level: Supervision/Verbal cueing    Lower Body Dressing/Undressing Lower body dressing      What is the patient wearing?: Pants     Lower body assist Assist for lower body dressing: Dependent - Patient 0%     Toileting Toileting    Toileting assist Assist for toileting: Maximal Assistance - Patient 25 - 49%     Transfers Chair/bed transfer  Transfers assist     Chair/bed transfer assist level: 2 Helpers     Locomotion Ambulation   Ambulation assist   Ambulation activity did not occur: Safety/medical concerns          Walk 10 feet activity   Assist  Walk 10 feet activity did not occur: Safety/medical concerns        Walk 50 feet activity   Assist Walk 50 feet with  2 turns activity did not occur: Safety/medical concerns         Walk 150 feet activity   Assist Walk 150 feet activity did not occur: Safety/medical concerns         Walk 10 feet on uneven surface  activity   Assist Walk 10 feet on uneven surfaces activity did not occur: Safety/medical concerns         Wheelchair     Assist Will patient use wheelchair at discharge?: Yes Type of Wheelchair: Power    Wheelchair assist level: Supervision/Verbal  cueing Max wheelchair distance: 150'    Wheelchair 50 feet with 2 turns activity    Assist        Assist Level: Supervision/Verbal cueing   Wheelchair 150 feet activity     Assist      Assist Level: Supervision/Verbal cueing   Blood pressure (!) 135/50, pulse 72, temperature 97.6 F (36.4 C), resp. rate 18, height 5\' 5"  (1.651 m), weight 61.2 kg, SpO2 94 %.  Medical Problem List and Plan: 1.   Tetraparesis secondary to cervical myelopathy -patient may  shower -ELOS/Goals: 20 to 24 days  -Continue CIR 2. Antithrombotics: -DVT/anticoagulation:Mechanical:Sequential compression devices, below kneeBilateral lower extremities. Bilateral lower extremity DVTs, age indeterminate, on . Started on Eliquis.   8/9- needs at least 3 months -antiplatelet therapy: N/A 3. Pain Management:Well controlled with prn Tylenol.  4. Mood:LCSW to follow for evaluation and support. -antipsychotic agents: N/A 5. Neuropsych: This patient is capable of making decisions on her own behalf. 6. Skin/Wound Care:Stage II sacral ulcer-Monitor wound for healing. Will add protein supplement and multivitamin to promote healing.  8/20- stage II sacral ulcer- looking stable. Not painful.   -continue turning and local care  8/24- no significant change- staying off backside as much as possible.  8/25- consulted [facility]OC -they think due to MASD- in gluteal fold at coccyx- started Aquacel with foam dressing  -PRAFO's for heels 7. Fluids/Electrolytes/Nutrition:Monitor I/O. Check lytes in am.  8. HTN: Monitor BP tid--continue HCTZ daily.Check BMET/K+ level in am.  8/20- Off HCTZ- DBP in 40s, but SBP well controlled  8/22 bp controlled 9. Post op labs: Pre-op K+ low normal.   8/20- K+ 3.5- labs q Monday  8/23- K+ 3.7- doing better  8/27- labs Monday  10 Neurogenic bladder: Has been incontinent with external catheter--will monitor voiding with  PVRs/bladder scan. UA negative. Continue Toviaz 4mg  daily  8/14: Continues to have urgency, no dysuria. Has sensation of voiding.   8/20- still has 50% of time bladder incontinence- will send to Neuro-Urology after d/c.   8/24- urinary retention occurring- will check U/A and Cx since is new- likely has forming UTI.   8/27- no urinary retention since started Keflex- no in/out caths required in last 24 hours, UC sensitive to Keflex. 11.Neurogenic bowel: KUB shows large stool burden and no obstruction. Had BM with enema on admission. On daily Miralax, colace TID, Senna HS.  bisacodyl 10mg  supp qpm  8/20- reduced Miralax to daily to help with loose stools- still getting bowel program nightly with lidocaine  8/25- good BM with bowel program last night  8/27- only smear last night- if no good BM tonight, needs some Sorbitol 12. Insomnia: Trazodone 100mg  did not provide benefit Try Amitriptyline 25mg .   8/27- poor sleep last night- con't regimen 13. Bradycardia  8/20-21- asymptomatic- running low 60s- con't to monitor 14. Nausea  8/16- give prns meds 15. Sleepiness  8/18- has been really sedated last 2 days- afebrile-  doesn't have any new meds- no Sx's of UTI  8/19-21 appears generally improved 16. Vertigo  8/24- will order PT evaluation for vertigo- sounds like Benign positional vertigo  8/25- will eval, but might not be able to treat due to cervical precautions- FYI 17. Urinary retention/UTI-  8/25- check U/A and Cx  8/26- U/A (+) for UTI- will start Keflex 500 mg TID for now and await Cx results  8/27- 100K E Coli- Cx pending for sensitivities- needs to look 18. Dispo  8/19- pt said daughters cannot care for her, but doesn't want to go to SNF- will discuss with pt/daughter this afternoon.   8/20- spoke with pt and daughter for additional 25 minutes- so total care 40 minutes today- as detailed above.   8/26- family plans on taking pt home.     Pt is a tetraplegic- incomplete quadriplegia-  with Stage II pressure ulcer and neurogenic bowel and bladder and spasticity- she needs a power w/c to help her do pressure relief since she cannot do in manual w/c- also unable to propel a manual w/c at all- also needs to help her transfer and allow her family to care for her; needs power w/c to get from 1 place to another- she also would benefit from elevating leg rests due to variable LE edema. She REQUIRES a ROHO w/c cushion due to current Stage II pressure ulcer, and needs to do pressure relief every 15 minutes while in the w/c.  Pt also NEEDS a Hoyer lift to transfer her- cannot transfer via transfer board at this time- she requires too much help and so needs [employer] lift.  Due to stage II pressure ulcer AND being tetraplegic, pt needs hospital bed to help her with turning at home, needs the rails, and to elevate her to help get her in/out of w/c, being level- she is unable to transfer from a regular bed at this time.    LOS: 26 days A FACE TO FACE EVALUATION WAS PERFORMED  Drema Pry Franchesca Veneziano 09/29/2019, 8:57 AM

## 2019-09-29 NOTE — Plan of Care (Signed)
  Problem: Consults Goal: Skin Care Protocol Initiated - if Braden Score 18 or less Description: If consults are not indicated, leave blank or document N/A Outcome: Progressing   Problem: SCI BOWEL ELIMINATION Goal: RH STG MANAGE BOWEL WITH ASSISTANCE Description: STG Manage Bowel with mod Assistance. Outcome: Progressing   Problem: SCI BOWEL ELIMINATION Goal: RH STG SCI MANAGE BOWEL WITH MEDICATION WITH ASSISTANCE Description: STG SCI Manage bowel with medication with min/mod assistance. Outcome: Progressing   Problem: SCI BLADDER ELIMINATION Goal: RH STG MANAGE BLADDER WITH ASSISTANCE Description: STG Manage Bladder With mod Assistance Outcome: Progressing   Problem: RH SKIN INTEGRITY Goal: RH STG SKIN FREE OF INFECTION/BREAKDOWN Description: Pt will be free of skin breakdown/infection prior to DC with mod/max assist Outcome: Progressing   Problem: RH SAFETY Goal: RH STG ADHERE TO SAFETY PRECAUTIONS W/ASSISTANCE/DEVICE Description: STG Adhere to Safety Precautions With cues and reminders Assistance/Device. Outcome: Progressing

## 2019-09-29 NOTE — Progress Notes (Signed)
Physical Therapy Session Note  Patient Details  Name: Haley Robinson MRN: [id number] Date of Birth: 15-Jun-1940  Today's Date: 09/29/2019 PT Individual Time: 1105-1210 PT Individual Time Calculation (min): 65 min   Short Term Goals: Week 4:  PT Short Term Goal 1 (Week 4): =LTG due to ELOS  Skilled Therapeutic Interventions/Progress Updates: Pt presented in bed with daughters present agreeable to therapy. Session focused on hands on family education and use of Hoyer lift. PTA observed primarily as sisters demonstrated positioning/placement and use of Hoyer. Each sister had a chance to demonstrate to PTA with PTA providing very minimal cueing. Pt was positioned and lifted x 3 times with Harrel Lemon. On third trial pt was transferred to power chair with PTA demonstrating use of handle on back of sling to adjust positioning of pt. Once pt in power chair both daughter's participated in repositioning pt. Throughout session good demonstration of body mechanics was shown in positioning and transferring. Per family request PTA transferred pt back to bed via SB with pt requiring maxA and maxA sit to supine. PTA reinforced recommendation that upon d/c to use Hoyer vs SB with family verbalizing understanding. Pt repositioned for safety and left in bed with nsg present and current needs met.      Therapy Documentation Precautions:  Precautions Precautions: Fall, Cervical Precaution Booklet Issued: Yes (comment) Precaution Comments: reviewed precautions Required Braces or Orthoses: Cervical Brace Cervical Brace: Soft collar, At all times Restrictions Weight Bearing Restrictions: No General:   Vital Signs: Therapy Vitals Temp: 98.1 F (36.7 C) Pulse Rate: 66 Resp: 18 BP: 126/63 Patient Position (if appropriate): Lying Oxygen Therapy SpO2: 100 % O2 Device: Room Air   Therapy/Group: Individual Therapy  Jovi Zavadil  Dael Howland, PTA  09/29/2019, 3:39 PM

## 2019-09-29 NOTE — Progress Notes (Signed)
Occupational Therapy Session Note  Patient Details  Name: Haley Robinson MRN: [id number] Date of Birth: 1940-12-22  Today's Date: 09/29/2019 OT Individual Time: 1004-1059 OT Individual Time Calculation (min): 55 min    Short Term Goals: Week 1:  OT Short Term Goal 1 (Week 1): Patient will maintain unsupported static sitting balance with Min A in prep for BADLs. OT Short Term Goal 1 - Progress (Week 1): Met OT Short Term Goal 2 (Week 1): Patient will don UB clothing with Mod A and use of compensatory technique. OT Short Term Goal 2 - Progress (Week 1): Met OT Short Term Goal 3 (Week 1): Patient will demonstrate squat-pivot transfers with Max A. OT Short Term Goal 3 - Progress (Week 1): Progressing toward goal OT Short Term Goal 4 (Week 1): Patient will thread 1 LE through LB clothing in circle sitting with use of compensatory technique in prep for completion of LB BADLs. OT Short Term Goal 4 - Progress (Week 1): Progressing toward goal  Skilled Therapeutic Interventions/Progress Updates:    1:1. Pt received in bed agreeable to OT with daughter potentially coming for fam ed at 1030 per pt. Pt completes bathing at supine level with A for UB washing back only and pt able to hook BUE onto bed rails to pull chest forward. Pt requires MOD A to doff shirt with MIN A to don shirt. Pt completes toileting at bed level with MIN A to roll for OT to place bed pan. Pt family arrives at 31, and Ot reviews bed pan placement, UB and LB dressing, rolling with MIN A and donning teds/footwear. Daughters assist with bed pan placement with min VC for proper technique as well as UB and LB dressing. Ot provides cuing to allow pt to do as much as she can for herself with threading Ues and head/pulling down back. Family provides A to thread BLE and advancing pants past hips with VC from OT for pulling on chuck pad to position hips for better rolling. Exited session with pt seated in bed, exit alarm on and call light in  reach  Therapy Documentation Precautions:  Precautions Precautions: Fall, Cervical Precaution Booklet Issued: Yes (comment) Precaution Comments: reviewed precautions Required Braces or Orthoses: Cervical Brace Cervical Brace: Soft collar, At all times Restrictions Weight Bearing Restrictions: No General:   Vital Signs: Therapy Vitals Temp: 97.6 F (36.4 C) Pulse Rate: 72 Resp: 18 BP: (!) 135/50 Patient Position (if appropriate): Lying Oxygen Therapy SpO2: 94 % O2 Device: Room Air Pain:   ADL: ADL Upper Body Bathing: Maximal assistance Where Assessed-Upper Body Bathing: Bed level Lower Body Bathing: Dependent Where Assessed-Lower Body Bathing: Bed level Upper Body Dressing: Maximal assistance Where Assessed-Upper Body Dressing: Bed level Lower Body Dressing: Dependent Where Assessed-Lower Body Dressing: Bed level Toileting: Dependent Where Assessed-Toileting: Bed level Toilet Transfer: Unable to assess Tub/Shower Transfer: Unable to assess ADL Comments: Max A grossly for UB BADLs and Total A grossly for LB BADLs at bed level in supported long/circle sitting. Vision   Perception    Praxis   Exercises:   Other Treatments:     Therapy/Group: Individual Therapy  Tonny Branch 09/29/2019, 7:25 AM

## 2019-09-29 NOTE — Progress Notes (Signed)
Pt incontinent of medium soft BM. Peri care and linen change given.

## 2019-09-29 NOTE — Progress Notes (Signed)
Suppository given with Lidocaine. Bowel stimulation performed. Pt tolerated well. Scant amount of soft brown stool noted to glove.

## 2019-09-30 NOTE — Progress Notes (Signed)
Stg 2 to pt coccyx noted to be macerated around edges and edges are discolored. On call MD notified. Advised she will put in wound care consult.

## 2019-09-30 NOTE — Progress Notes (Signed)
[facility] PHYSICAL MEDICINE & REHABILITATION PROGRESS NOTE   Subjective/Complaints: No complaints this morning. Nurse Trish alerted me that stage 2 pressure injury looks worse. I examined and there is area of darker discoloration at border and nurse notes it is getting larger- wound care consult ordered to see if they have any further care recommendations for Haley Robinson  ROS:   Pt denies SOB, abd pain, CP, N/V/C/D, and vision changes   Objective:   No results found. No results for input(s): WBC, HGB, HCT, PLT in the last 72 hours. No results for input(s): NA, K, CL, CO2, GLUCOSE, BUN, CREATININE, CALCIUM in the last 72 hours.  Intake/Output Summary (Last 24 hours) at 09/30/2019 1000 Last data filed at 09/30/2019 0855 Gross per 24 hour  Intake 462 ml  Output --  Net 462 ml     Physical Exam: Vital Signs Blood pressure (!) 133/50, pulse 61, temperature 97.7 F (36.5 C), resp. rate 18, height 5\' 5"  (1.651 m), weight 60.7 kg, SpO2 98 %. General: Alert and oriented x 3, No apparent distress HEENT: Head is normocephalic, atraumatic, PERRLA, EOMI, sclera anicteric, oral mucosa pink and moist, dentition intact, ext ear canals clear,  Neck: Supple without JVD or lymphadenopathy Heart: Reg rate and rhythm. No murmurs rubs or gallops Chest: CTA bilaterally without wheezes, rales, or rhonchi; no distress Abdomen: Soft, non-tender, non-distended, bowel sounds positive. Extremities: No clubbing, cyanosis, or edema. Pulses are 2+ Psych: flat affect- slightly better this AM Skin: open- oval shaped- just the top layer gone, but very superficial - between buttocks- in crease- Stage II- with darker pigment at border and now approx 2cm.  Neurologic: spasms seen, RLE>LLE- was causing pt pain- occurred x1 RLE- 0 to tr/5 Musculoskeletal: No pain with active assisted as well as passive range of motion in the upper lower limb no joint swelling. Right heel a little sore, skin intact as above Sensation intact  throughout   Assessment/Plan: 1. Functional deficits secondary to tetraparesis secondary to cervical myelopathy which require 3+ hours per day of interdisciplinary therapy in a comprehensive inpatient rehab setting.  Physiatrist is providing close team supervision and 24 hour management of active medical problems listed below.  Physiatrist and rehab team continue to assess barriers to discharge/monitor patient progress toward functional and medical goals  Care Tool:  Bathing    Body parts bathed by patient: Right arm, Left arm, Chest, Abdomen, Front perineal area, Right upper leg, Left upper leg, Right lower leg, Face   Body parts bathed by helper: Buttocks, Right lower leg, Left lower leg     Bathing assist Assist Level: Moderate Assistance - Patient 50 - 74%     Upper Body Dressing/Undressing Upper body dressing   What is the patient wearing?: Pull over shirt    Upper body assist Assist Level: Supervision/Verbal cueing    Lower Body Dressing/Undressing Lower body dressing      What is the patient wearing?: Pants     Lower body assist Assist for lower body dressing: Dependent - Patient 0%     Toileting Toileting    Toileting assist Assist for toileting: Maximal Assistance - Patient 25 - 49%     Transfers Chair/bed transfer  Transfers assist     Chair/bed transfer assist level: 2 Helpers     Locomotion Ambulation   Ambulation assist   Ambulation activity did not occur: Safety/medical concerns          Walk 10 feet activity   Assist  Walk 10 feet  activity did not occur: Safety/medical concerns        Walk 50 feet activity   Assist Walk 50 feet with 2 turns activity did not occur: Safety/medical concerns         Walk 150 feet activity   Assist Walk 150 feet activity did not occur: Safety/medical concerns         Walk 10 feet on uneven surface  activity   Assist Walk 10 feet on uneven surfaces activity did not occur:  Safety/medical concerns         Wheelchair     Assist Will patient use wheelchair at discharge?: Yes Type of Wheelchair: Power    Wheelchair assist level: Supervision/Verbal cueing Max wheelchair distance: 150'    Wheelchair 50 feet with 2 turns activity    Assist        Assist Level: Supervision/Verbal cueing   Wheelchair 150 feet activity     Assist      Assist Level: Supervision/Verbal cueing   Blood pressure (!) 133/50, pulse 61, temperature 97.7 F (36.5 C), resp. rate 18, height 5\' 5"  (1.651 m), weight 60.7 kg, SpO2 98 %.  Medical Problem List and Plan: 1.   Tetraparesis secondary to cervical myelopathy -patient may  shower -ELOS/Goals: 20 to 24 days  -Continue CIR 2. Antithrombotics: -DVT/anticoagulation:Mechanical:Sequential compression devices, below kneeBilateral lower extremities. Bilateral lower extremity DVTs, age indeterminate, on . Started on Eliquis.  8/9- needs at least 3 months -antiplatelet therapy: N/A 3. Pain Management:Well controlled with prn Tylenol.  4. Mood:LCSW to follow for evaluation and support. -antipsychotic agents: N/A 5. Neuropsych: This patient is capable of making decisions on her own behalf. 6. Skin/Wound Care:Stage II sacral ulcer-Monitor wound for healing. Will add protein supplement and multivitamin to promote healing.  8/20- stage II sacral ulcer- looking stable. Not painful.   -continue turning and local care  8/24- no significant change- staying off backside as much as possible.  8/25- consulted WOC -they think due to MASD- in gluteal fold at coccyx- started Aquacel with foam dressing  8/29: Nurse Trish alerted me that stage 2 pressure injury looks worse. I examined and there is area of darker discoloration at border and nurse notes it is getting larger- wound care consult ordered to see if they have any further care recommendations for 9/29  -PRAFO's  for heels 7. Fluids/Electrolytes/Nutrition:Monitor I/O. Check lytes in am.  8. HTN: Monitor BP tid--continue HCTZ daily.Check BMET/K+ level in am.  8/20- Off HCTZ- DBP in 40s, but SBP well controlled  8/22 bp controlled 9. Post op labs: Pre-op K+ low normal.   8/20- K+ 3.5- labs q Monday  8/23- K+ 3.7- doing better  8/27- labs Monday  10 Neurogenic bladder: Has been incontinent with external catheter--will monitor voiding with PVRs/bladder scan. UA negative. Continue Toviaz 4mg  daily  8/14: Continues to have urgency, no dysuria. Has sensation of voiding.   8/20- still has 50% of time bladder incontinence- will send to Neuro-Urology after d/c.   8/24- urinary retention occurring- will check U/A and Cx since is new- likely has forming UTI.   8/27- no urinary retention since started Keflex- no in/out caths required in last 24 hours, UC sensitive to Keflex. 11.Neurogenic bowel: KUB shows large stool burden and no obstruction. Had BM with enema on admission. On daily Miralax, colace TID, Senna HS.  bisacodyl 10mg  supp qpm  8/20- reduced Miralax to daily to help with loose stools- still getting bowel program nightly with lidocaine  8/25- good BM  with bowel program last night  8/27- only smear last night- if no good BM tonight, needs some Sorbitol  8/29: Incontinent of small medium BM last night and has smears this morning.  12. Insomnia: Trazodone 100mg  did not provide benefit Try Amitriptyline 25mg .   8/27- poor sleep last night- con't regimen 13. Bradycardia  8/20-21- asymptomatic- running low 60s- con't to monitor 14. Nausea  8/16- give prns meds 15. Sleepiness  8/18- has been really sedated last 2 days- afebrile- doesn't have any new meds- no Sx's of UTI  8/19-21 appears generally improved 16. Vertigo  8/24- will order PT evaluation for vertigo- sounds like Benign positional vertigo  8/25- will eval, but might not be able to treat due to cervical precautions- FYI 17. Urinary  retention/UTI-  8/25- check U/A and Cx  8/26- U/A (+) for UTI- will start Keflex 500 mg TID for now and await Cx results  8/27- 100K E Coli- Cx pending for sensitivities- needs to look 18. Dispo  8/19- pt said daughters cannot care for her, but doesn't want to go to SNF- will discuss with pt/daughter this afternoon.   8/20- spoke with pt and daughter for additional 25 minutes- so total care 40 minutes today- as detailed above.   8/26- family plans on taking pt home.     Pt is a tetraplegic- incomplete quadriplegia- with Stage II pressure ulcer and neurogenic bowel and bladder and spasticity- she needs a power w/c to help her do pressure relief since she cannot do in manual w/c- also unable to propel a manual w/c at all- also needs to help her transfer and allow her family to care for her; needs power w/c to get from 1 place to another- she also would benefit from elevating leg rests due to variable LE edema. She REQUIRES a ROHO w/c cushion due to current Stage II pressure ulcer, and needs to do pressure relief every 15 minutes while in the w/c.  Pt also NEEDS a Hoyer lift to transfer her- cannot transfer via transfer board at this time- she requires too much help and so needs 9/20 lift.  Due to stage II pressure ulcer AND being tetraplegic, pt needs hospital bed to help her with turning at home, needs the rails, and to elevate her to help get her in/out of w/c, being level- she is unable to transfer from a regular bed at this time.    LOS: 27 days A FACE TO FACE EVALUATION WAS PERFORMED  Nilah Belcourt P Veryl Abril 09/30/2019, 10:00 AM

## 2019-09-30 NOTE — Plan of Care (Signed)
  Problem: Consults Goal: RH SPINAL CORD INJURY PATIENT EDUCATION Description:  See Patient Education module for education specifics.  Outcome: Progressing Goal: Skin Care Protocol Initiated - if Braden Score 18 or less Description: If consults are not indicated, leave blank or document N/A Outcome: Progressing   Problem: SCI BOWEL ELIMINATION Goal: RH STG MANAGE BOWEL WITH ASSISTANCE Description: STG Manage Bowel with mod Assistance. Outcome: Progressing Goal: RH STG SCI MANAGE BOWEL WITH MEDICATION WITH ASSISTANCE Description: STG SCI Manage bowel with medication with min/mod assistance. Outcome: Progressing   Problem: RH SKIN INTEGRITY Goal: RH STG SKIN FREE OF INFECTION/BREAKDOWN Description: Pt will be free of skin breakdown/infection prior to DC with mod/max assist Outcome: Progressing Goal: RH STG MAINTAIN SKIN INTEGRITY WITH ASSISTANCE Description: STG Maintain Skin Integrity With mod/max Assistance. Outcome: Progressing   Problem: SCI BLADDER ELIMINATION Goal: RH STG MANAGE BLADDER WITH ASSISTANCE Description: STG Manage Bladder With mod Assistance Outcome: Progressing   Problem: RH SAFETY Goal: RH STG ADHERE TO SAFETY PRECAUTIONS W/ASSISTANCE/DEVICE Description: STG Adhere to Safety Precautions With cues and reminders Assistance/Device. Outcome: Progressing

## 2019-09-30 NOTE — Progress Notes (Signed)
Pt suppository given per order. Bowel stimulation performed. Pt tolerated well. Slight amount of soft, brown stool noted to glove.

## 2019-10-01 ENCOUNTER — Inpatient Hospital Stay (HOSPITAL_COMMUNITY): Payer: Medicare PPO

## 2019-10-01 ENCOUNTER — Ambulatory Visit (HOSPITAL_COMMUNITY): Payer: Medicare PPO | Admitting: Physical Therapy

## 2019-10-01 LAB — CBC
HCT: 34.5 % — ABNORMAL LOW (ref 36.0–46.0)
Hemoglobin: 11.1 g/dL — ABNORMAL LOW (ref 12.0–15.0)
MCH: 31.8 pg (ref 26.0–34.0)
MCHC: 32.2 g/dL (ref 30.0–36.0)
MCV: 98.9 fL (ref 80.0–100.0)
Platelets: 229 10*3/uL (ref 150–400)
RBC: 3.49 MIL/uL — ABNORMAL LOW (ref 3.87–5.11)
RDW: 13.1 % (ref 11.5–15.5)
WBC: 7.7 10*3/uL (ref 4.0–10.5)
nRBC: 0 % (ref 0.0–0.2)

## 2019-10-01 LAB — BASIC METABOLIC PANEL
Anion gap: 6 (ref 5–15)
BUN: 10 mg/dL (ref 8–23)
CO2: 32 mmol/L (ref 22–32)
Calcium: 9.2 mg/dL (ref 8.9–10.3)
Chloride: 104 mmol/L (ref 98–111)
Creatinine, Ser: 0.69 mg/dL (ref 0.44–1.00)
GFR calc Af Amer: >60 mL/min (ref >60)
GFR calc non Af Amer: >60 mL/min (ref >60)
Glucose, Bld: 100 mg/dL — ABNORMAL HIGH (ref 70–99)
Potassium: 3.6 mmol/L (ref 3.5–5.1)
Sodium: 142 mmol/L (ref 135–145)

## 2019-10-01 NOTE — Consult Note (Signed)
WOC Nurse Consult Note: Patient receiving care in [facility] [phone]. Reason for Consult: worsening sacral wound Wound type: What was MASD ITD, IAD has now morphed into an unstageble PI to the sacral/coccyx area.  What was moist and pink is now yellow and purple, unstageable.  Of importance, the patient is frequently incontinent of urine and saturating her dressing, and wears a brief at night. Her night shift and day shift RNs and I spoke about using a Purewick at night. Pressure Injury POA: No Measurement: 3 cm x 0.8 cm Wound bed: yellow and purple Drainage (amount, consistency, odor) none Periwound: moist from urine Dressing procedure/placement/frequency: Wash area with soap and water. Pat dry. Apply skin prep pad solution all around the wound.  Allow this to dry. Remove the backing of a hydrocolloid dressing [facility] [phone]), fold it in half. Tuck the dressing all the way to the bottom of the gluteal fold crease. Smooth down. Change daily and prn. Monitor the wound area(s) for worsening of condition such as: Signs/symptoms of infection,  Increase in size,  Development of or worsening of odor, Development of pain, or increased pain at the affected locations.  Notify the medical team if any of these develop.  Thank you for the consult.  Discussed plan of care with the patient and bedside nurse.  Helmut Muster, RN, MSN, CWOCN, CNS-BC, pager [phone]

## 2019-10-01 NOTE — Progress Notes (Signed)
Daughter stayed to participate in bowel program. Demonstrated how to in and out cath and discussed foley care. Daughter performed dig stim, then placed suppository. DIscussed following up with more dig stim after suppository has worked. Verbalized comfort with procedure

## 2019-10-01 NOTE — Progress Notes (Signed)
Patient ID: Haley Robinson, female   DOB: 1940-05-11, 79 y.o.   MRN: [id number]  SW received message from pt daughter [name] [phone]) asking questions related to pt discharge with regard to pt now having a UTI and wound care and wanted to know frequency of nursing staff that will come out, as well as if able to get home health. Also stated now needs hospital bed as did not believe hospital bed that she was able to get through [employer] will be appropriate. States she will be coming in today for more family education between 1pm-2pm.  SW spoke with pt assigned RN Skeet Simmer to get updates to discuss I/O. Reports PBRs remain around 300s.   SW spoke with attending Dr. Berline Chough who reported pt now needs an air mattress due to an unstageable sacral wound. SW discuss I/O cath or foley at d/c. Reports either she or PA will discuss further with family. SW informed PA Pam Love on above with regard to bladder concerns. SW informed nursing to call PA once daughter arrives to floor.   SW left message for pt daughter [name] to inform SW will work on hospital bed order through [employer], and will discuss in person questions related to home health as she will be in today for family education. SW spoke with Ashley/Intake with [facility] [phone] or cell #[phone]/f:[phone]) to inform on changes with wound. SW faxed over updated clinicals.   Cecile Sheerer, MSW, LCSWA Office: [phone] Cell: [phone] Fax: [phone]

## 2019-10-01 NOTE — Plan of Care (Signed)
  Problem: Consults Goal: RH SPINAL CORD INJURY PATIENT EDUCATION Description:  See Patient Education module for education specifics.  Outcome: Progressing Goal: Skin Care Protocol Initiated - if Braden Score 18 or less Description: If consults are not indicated, leave blank or document N/A Outcome: Progressing   Problem: SCI BOWEL ELIMINATION Goal: RH STG MANAGE BOWEL WITH ASSISTANCE Description: STG Manage Bowel with mod Assistance. Outcome: Progressing Goal: RH STG SCI MANAGE BOWEL WITH MEDICATION WITH ASSISTANCE Description: STG SCI Manage bowel with medication with min/mod assistance. Outcome: Progressing   Problem: SCI BLADDER ELIMINATION Goal: RH STG MANAGE BLADDER WITH ASSISTANCE Description: STG Manage Bladder With mod Assistance Outcome: Progressing   Problem: RH SKIN INTEGRITY Goal: RH STG SKIN FREE OF INFECTION/BREAKDOWN Description: Pt will be free of skin breakdown/infection prior to DC with mod/max assist Outcome: Progressing Goal: RH STG MAINTAIN SKIN INTEGRITY WITH ASSISTANCE Description: STG Maintain Skin Integrity With mod/max Assistance. Outcome: Progressing   Problem: RH SAFETY Goal: RH STG ADHERE TO SAFETY PRECAUTIONS W/ASSISTANCE/DEVICE Description: STG Adhere to Safety Precautions With cues and reminders Assistance/Device. Outcome: Progressing   

## 2019-10-01 NOTE — Progress Notes (Signed)
Occupational Therapy Session Note  Patient Details  Name: Haley Robinson MRN: [id number] Date of Birth: 1940/04/04  Today's Date: 10/01/2019 OT Individual Time: 1030-1100 OT Individual Time Calculation (min): 30 min  and Today's Date: 10/01/2019 OT Missed Time: 30 Minutes Missed Time Reason: Nursing care   Short Term Goals: Week 4:  OT Short Term Goal 1 (Week 4): Patient will demonstrate squat-pivot transfers with Max A OT Short Term Goal 2 (Week 4): Pt will maintain dynamic sitting balance EOB/EOM with Min A during functional activities OT Short Term Goal 3 (Week 4): Pt will complete toilting tasks with mod A  Skilled Therapeutic Interventions/Progress Updates:    Pt missed 30 mins skilled OT services at beginning of scheduled session.  Pt receiving nursing care. OT intervention with focus on BADL training, bed mobility, sitting balance, slide board transfers, and activity tolerance to increase independence with BADLs. See [employer]re Tool for assist levels. Slide board transfer with max A +2 for safety. Sitting balance EOB with min A/CGA. Pt requires min A for lateral lean. Mod a for supine>sit EOB. Pt completed LB bathing/dressing at bed level; UB batihng/dressing with supervision seated in w/c at sink. Pt remained in power w/c with all needs within reach. Seat belt secured. Pt able to independently operate power w/c for pressure relief.   Therapy Documentation Precautions:  Precautions Precautions: Fall, Cervical Precaution Booklet Issued: Yes (comment) Precaution Comments: reviewed precautions Required Braces or Orthoses: Cervical Brace Cervical Brace: Soft collar, At all times Restrictions Weight Bearing Restrictions: No General: General OT Amount of Missed Time: 30 Minutes  Pain:  Pt denies pain this morning   Therapy/Group: Individual Therapy  Rich Brave 10/01/2019, 12:02 PM

## 2019-10-01 NOTE — Progress Notes (Signed)
[place] PHYSICAL MEDICINE & REHABILITATION PROGRESS NOTE   Subjective/Complaints:  WOC said Stage II ulcer has gotten worse- is now very macerated and now unstageable- now yellow and purple-   Will start using Purewick at night to help keep her dry and night.   3cm and 0.8 cm.  Moist/macerated from urine.   Pt reports she knows wound is worse- explained ot her that 1/5x I see her, in a good posiiton, the other 4x she's sitting/laying right no sacrum and not turned to side. If she's to heal, needs this to heal.   Needed cathing yesterday per pt. Will get family taught how to cath in case needs at home.  Might need air mattress?        ROS:    Pt denies SOB, abd pain, CP, N/V/C/D, and vision changes     Objective:   No results found. Recent Labs    10/01/19 0453  WBC 7.7  HGB 11.1*  HCT 34.5*  PLT 229   Recent Labs    10/01/19 0453  NA 142  K 3.6  CL 104  CO2 32  GLUCOSE 100*  BUN 10  CREATININE 0.69  CALCIUM 9.2    Intake/Output Summary (Last 24 hours) at 10/01/2019 0926 Last data filed at 09/30/2019 1841 Gross per 24 hour  Intake 422 ml  Output 600 ml  Net -178 ml     Physical Exam: Vital Signs Blood pressure (!) 141/80, pulse (!) 57, temperature (!) 97.4 F (36.3 C), temperature source Oral, resp. rate 17, height 5\' 5"  (1.651 m), weight 60.7 kg, SpO2 100 %. General: sitting up on sacrum- is eating breakfast- cereal, appropriate, NAD HEENT: conjugate gaze,  Neck: Supple without JVD or lymphadenopathy Heart: RRR Chest: CTA B/L- no W/R/R- good air movement Abdomen: Soft, NT, ND, (+)BS  Extremities: No clubbing, cyanosis, or edema. Pulses are 2+ Psych: flat affect Skin: open- oval shaped- just the top layer gone, but very superficial - between buttocks- in crease- Stage II- with darker pigment at border and now approx 2cm.  Neurologic: spasms seen, RLE>LLE- still seen again this AM RLE- 0 to tr/5- still Musculoskeletal: No pain with active  assisted as well as passive range of motion in the upper lower limb no joint swelling. Right heel a little sore, skin intact as above Sensation intact throughout   Assessment/Plan: 1. Functional deficits secondary to tetraparesis secondary to cervical myelopathy which require 3+ hours per day of interdisciplinary therapy in a comprehensive inpatient rehab setting.  Physiatrist is providing close team supervision and 24 hour management of active medical problems listed below.  Physiatrist and rehab team continue to assess barriers to discharge/monitor patient progress toward functional and medical goals  Care Tool:  Bathing    Body parts bathed by patient: Right arm, Left arm, Chest, Abdomen, Front perineal area, Right upper leg, Left upper leg, Right lower leg, Face   Body parts bathed by helper: Buttocks, Right lower leg, Left lower leg     Bathing assist Assist Level: Moderate Assistance - Patient 50 - 74%     Upper Body Dressing/Undressing Upper body dressing   What is the patient wearing?: Pull over shirt    Upper body assist Assist Level: Supervision/Verbal cueing    Lower Body Dressing/Undressing Lower body dressing      What is the patient wearing?: Pants     Lower body assist Assist for lower body dressing: Dependent - Patient 0%     Toileting Toileting  Toileting assist Assist for toileting: Maximal Assistance - Patient 25 - 49%     Transfers Chair/bed transfer  Transfers assist     Chair/bed transfer assist level: 2 Helpers     Locomotion Ambulation   Ambulation assist   Ambulation activity did not occur: Safety/medical concerns          Walk 10 feet activity   Assist  Walk 10 feet activity did not occur: Safety/medical concerns        Walk 50 feet activity   Assist Walk 50 feet with 2 turns activity did not occur: Safety/medical concerns         Walk 150 feet activity   Assist Walk 150 feet activity did not occur:  Safety/medical concerns         Walk 10 feet on uneven surface  activity   Assist Walk 10 feet on uneven surfaces activity did not occur: Safety/medical concerns         Wheelchair     Assist Will patient use wheelchair at discharge?: Yes Type of Wheelchair: Power    Wheelchair assist level: Supervision/Verbal cueing Max wheelchair distance: 150'    Wheelchair 50 feet with 2 turns activity    Assist        Assist Level: Supervision/Verbal cueing   Wheelchair 150 feet activity     Assist      Assist Level: Supervision/Verbal cueing   Blood pressure (!) 141/80, pulse (!) 57, temperature (!) 97.4 F (36.3 C), temperature source Oral, resp. rate 17, height 5\' 5"  (1.651 m), weight 60.7 kg, SpO2 100 %.  Medical Problem List and Plan: 1.   Tetraparesis secondary to cervical myelopathy -patient may  shower -ELOS/Goals: 20 to 24 days  -Continue CIR 2. Antithrombotics: -DVT/anticoagulation:Mechanical:Sequential compression devices, below kneeBilateral lower extremities. Bilateral lower extremity DVTs, age indeterminate, on . Started on Eliquis.  8/9- needs at least 3 months -antiplatelet therapy: N/A 3. Pain Management:Well controlled with prn Tylenol.  4. Mood:LCSW to follow for evaluation and support. -antipsychotic agents: N/A 5. Neuropsych: This patient is capable of making decisions on her own behalf. 6. Skin/Wound Care:Unstageable- sacral ulcer-Monitor wound for healing. Will add protein supplement and multivitamin to promote healing.  8/20- stage II sacral ulcer- looking stable. Not painful.   -continue turning and local care  8/24- no significant change- staying off backside as much as possible.  8/25- consulted WOC -they think due to MASD- in gluteal fold at coccyx- started Aquacel with foam dressing  8/29: Nurse Trish alerted me that stage 2 pressure injury looks worse. I examined and  there is area of darker discoloration at border and nurse notes it is getting larger- wound care consult ordered to see if they have any further care recommendations for 9/29  8/30- Pt's wound is unstageable- yellow/purple- will d/w nursing getting air mattress with SW  -PRAFO's for heels 7. Fluids/Electrolytes/Nutrition:Monitor I/O. Check lytes in am.  8. HTN: Monitor BP tid--continue HCTZ daily.Check BMET/K+ level in am.  8/20- Off HCTZ- DBP in 40s, but SBP well controlled  8/22 bp controlled 9. Post op labs: Pre-op K+ low normal.   8/20- K+ 3.5- labs q Monday  8/23- K+ 3.7- doing better  8/27- labs Monday  10 Neurogenic bladder: Has been incontinent with external catheter--will monitor voiding with PVRs/bladder scan. UA negative. Continue Toviaz 4mg  daily  8/14: Continues to have urgency, no dysuria. Has sensation of voiding.   8/20- still has 50% of time bladder incontinence- will send to Neuro-Urology after  d/c.   8/24- urinary retention occurring- will check U/A and Cx since is new- likely has forming UTI.   8/27- no urinary retention since started Keflex- no in/out caths required in last 24 hours, UC sensitive to Keflex.  8/30- will talk to family about Foley- making wound worse and PVRs 250-300cc most times- would benefit from Foley for skin protection.  11.Neurogenic bowel: KUB shows large stool burden and no obstruction. Had BM with enema on admission. On daily Miralax, colace TID, Senna HS.  bisacodyl 10mg  supp qpm  8/20- reduced Miralax to daily to help with loose stools- still getting bowel program nightly with lidocaine  8/25- good BM with bowel program last night  8/27- only smear last night- if no good BM tonight, needs some Sorbitol  8/29: Incontinent of small medium BM last night and has smears this morning.  12. Insomnia: Trazodone 100mg  did not provide benefit Try Amitriptyline 25mg .   8/27- poor sleep last night- con't regimen 13. Bradycardia  8/20-21- asymptomatic-  running low 60s- con't to monitor 14. Nausea  8/16- give prns meds 15. Sleepiness  8/18- has been really sedated last 2 days- afebrile- doesn't have any new meds- no Sx's of UTI  8/19-21 appears generally improved 16. Vertigo  8/24- will order PT evaluation for vertigo- sounds like Benign positional vertigo  8/25- will eval, but might not be able to treat due to cervical precautions- FYI 17. Urinary retention/UTI-  8/25- check U/A and Cx  8/26- U/A (+) for UTI- will start Keflex 500 mg TID for now and await Cx results  8/27- 100K E Coli- Cx pending for sensitivities- needs to look  8/30- needs Foley if at all possible to protect skin 18. Dispo  8/19- pt said daughters cannot care for her, but doesn't want to go to SNF- will discuss with pt/daughter this afternoon.   8/20- spoke with pt and daughter for additional 25 minutes- so total care 40 minutes today- as detailed above.   8/26- family plans on taking pt home.     Pt is a tetraplegic- incomplete quadriplegia- with Stage II pressure ulcer and neurogenic bowel and bladder and spasticity- she needs a power w/c to help her do pressure relief since she cannot do in manual w/c- also unable to propel a manual w/c at all- also needs to help her transfer and allow her family to care for her; needs power w/c to get from 1 place to another- she also would benefit from elevating leg rests due to variable LE edema. She REQUIRES a ROHO w/c cushion due to current Stage II pressure ulcer, and needs to do pressure relief every 15 minutes while in the w/c.  Pt also NEEDS a Hoyer lift to transfer her- cannot transfer via transfer board at this time- she requires too much help and so needs 9/19 lift.  Due to stage II pressure ulcer AND being tetraplegic, pt needs hospital bed to help her with turning at home, needs the rails, and to elevate her to help get her in/out of w/c, being level- she is unable to transfer from a regular bed at this time.     LOS: 28 days A FACE TO FACE EVALUATION WAS PERFORMED  Haley Robinson 10/01/2019, 9:26 AM

## 2019-10-01 NOTE — Progress Notes (Signed)
Occupational Therapy Session Note  Patient Details  Name: MARLISSA EMERICK MRN: [id number] Date of Birth: 1940/05/29  Today's Date: 10/01/2019 OT Individual Time: 1300-1354 OT Individual Time Calculation (min): 54 min    Short Term Goals: Week 4:  OT Short Term Goal 1 (Week 4): Patient will demonstrate squat-pivot transfers with Max A OT Short Term Goal 2 (Week 4): Pt will maintain dynamic sitting balance EOB/EOM with Min A during functional activities OT Short Term Goal 3 (Week 4): Pt will complete toilting tasks with mod A  Skilled Therapeutic Interventions/Progress Updates:    Pt resting in w/c upon arrival with daughter, [name], present for education.  Pt's daughter stated she felt comfortable with Michiel Sites transfers and had completed 3 transfers. Pt transferred back to bed via Slide Board and daughter educated on bed positioning, PRAFO wearing schedule and donning/doffing, Ted Hose wearing schedule. Daughter educated on easier method for donning Ted hose.  Pt's daughters have received no education regarding bowel/bladder program or wound care.  Pt's RN Skeet Simmer) notified. Pt remained in bed with daughter present. All needs within reach.   Therapy Documentation Precautions:  Precautions Precautions: Fall, Cervical Precaution Booklet Issued: Yes (comment) Precaution Comments: reviewed precautions Required Braces or Orthoses: Cervical Brace Cervical Brace: Soft collar, At all times Restrictions Weight Bearing Restrictions: No Pain:  Pt denies pain this afternoon   Therapy/Group: Individual Therapy  Rich Brave 10/01/2019, 1:54 PM

## 2019-10-01 NOTE — Progress Notes (Signed)
Physical Therapy Session Note  Patient Details  Name: Haley Robinson MRN: [id number] Date of Birth: January 25, 1941  Today's Date: 10/01/2019 PT Individual Time: 1415-1453 PT Individual Time Calculation (min): 38 min  PT Missed Time: 22 min Missed Time Reason: nursing care  Short Term Goals: Week 4:  PT Short Term Goal 1 (Week 4): =LTG due to ELOS  Skilled Therapeutic Interventions/Progress Updates:    Pt received in L sidelying in bed, agreeable to PT session. Pt reports her sacrum was sore so her daughter assisted her with rolling into L sidelying. Pt's daughter [name] present for ongoing family education. Pt and family with questions regarding bowel and bladder management as this has not been addressed yet. PA in room to address bladder management and RN notified that pt and family requesting education with regards to bowel program and wound care for sacral wound. Assisted pt with rolling onto R side with min A. Once in R sidelying reviewed management of Roho cushion. Pt and family with no further questions and feel comfortable with performing bed mobility, transfers via hoyer, power wheelchair management, pressure relief schedule, and UE/LE PROM. Pt and daughter in room awaiting RN to provide education. Pt missed 22 min of scheduled therapy session to allow time for RN education to take place.  Therapy Documentation Precautions:  Precautions Precautions: Fall, Cervical Precaution Booklet Issued: Yes (comment) Precaution Comments: reviewed precautions Required Braces or Orthoses: Cervical Brace Cervical Brace: Soft collar, At all times Restrictions Weight Bearing Restrictions: No    Therapy/Group: Individual Therapy   Peter Congo, PT, DPT  10/01/2019, 5:00 PM

## 2019-10-02 ENCOUNTER — Ambulatory Visit (HOSPITAL_COMMUNITY): Payer: Medicare PPO | Admitting: Physical Therapy

## 2019-10-02 ENCOUNTER — Inpatient Hospital Stay (HOSPITAL_COMMUNITY): Payer: Medicare PPO

## 2019-10-02 ENCOUNTER — Inpatient Hospital Stay (HOSPITAL_COMMUNITY): Payer: Medicare PPO | Admitting: Physical Therapy

## 2019-10-02 MED ORDER — CHLORHEXIDINE GLUCONATE CLOTH 2 % EX PADS
6.0000 | MEDICATED_PAD | Freq: Every day | CUTANEOUS | Status: DC
  Administered 2019-10-02: 6 via TOPICAL

## 2019-10-02 NOTE — Progress Notes (Signed)
Patient ID: Haley Robinson, female   DOB: 19-May-1940, 79 y.o.   MRN: [id number]  [place]/Intake with [facility] [phone] or cell #[phone]/f:[phone]) requesting SN (wound care) order. SW faxed order.   SW returned phone call/left message for pt dtr [name] [phone]) to inform that [facility] SOC will be either 9/2 or 9/3. SW also requested return phone call to discuss ambulance transport pick up time here. SW waiting on follow-up.   SW scheduled [facility] ambulance transport to home on 9/1 at 10am.  SW spoke with pt dtr [name] to discuss above. SW answered questions related to [facility]. She states hospital bed to be delivered today. SW provided contact information for pharmacy so she could pay for pt medications.  *SW received follow-up from [facility] indicating that prior authorization for ambulance was not required for pt insurance. SW spoke with supervisor Judeth Cornfield [phone]) stating no prior auth was required. SW followed up with [facility] to inform.    Cecile Sheerer, MSW, LCSWA Office: [phone] Cell: [phone] Fax: [phone]

## 2019-10-02 NOTE — Progress Notes (Signed)
[place] PHYSICAL MEDICINE & REHABILITATION PROGRESS NOTE   Subjective/Complaints:  Pt reports she's willing to get foley, but was wondering when would be removed- explained it's for long term- she said she thought it would come out "sometimes"- I explained that we exchange it, but since she hasn't been able to fully empty bladder, is the reason she has UTI currently, so it will reduce risk of UTI.   Will place Foley- daughter learned bowel program last night.      ROS:   Pt denies SOB, abd pain, CP, N/V/C/D, and vision changes    Objective:   No results found. Recent Labs    10/01/19 0453  WBC 7.7  HGB 11.1*  HCT 34.5*  PLT 229   Recent Labs    10/01/19 0453  NA 142  K 3.6  CL 104  CO2 32  GLUCOSE 100*  BUN 10  CREATININE 0.69  CALCIUM 9.2    Intake/Output Summary (Last 24 hours) at 10/02/2019 0841 Last data filed at 10/02/2019 0600 Gross per 24 hour  Intake 342 ml  Output 1600 ml  Net -1258 ml     Physical Exam: Vital Signs Blood pressure (!) 118/43, pulse 63, temperature 98 F (36.7 C), resp. rate 16, height 5\' 5"  (1.651 m), weight 60.7 kg, SpO2 94 %. General: awake, alert, appropriate, depressed affect, NAD HEENT: conjugate gaze,  Neck: Supple without JVD or lymphadenopathy Heart: RRR Chest: CTA B/L- no W/R/R- good air movement Abdomen: Soft, NT, ND, (+)BS   Extremities: No clubbing, cyanosis, or edema. Pulses are 2+ Psych: flat affect Skin: DTI with unstageable wound on sacrum- yellow/purple- larger now  Neurologic: spasms seen, RLE>LLE- still seen again this AM RLE- 0 to tr/5- still Musculoskeletal: No pain with active assisted as well as passive range of motion in the upper lower limb no joint swelling. Right heel a little sore, skin intact as above Sensation intact throughout   Assessment/Plan: 1. Functional deficits secondary to tetraparesis secondary to cervical myelopathy which require 3+ hours per day of interdisciplinary therapy in a  comprehensive inpatient rehab setting.  Physiatrist is providing close team supervision and 24 hour management of active medical problems listed below.  Physiatrist and rehab team continue to assess barriers to discharge/monitor patient progress toward functional and medical goals  Care Tool:  Bathing    Body parts bathed by patient: Right arm, Left arm, Chest, Abdomen, Front perineal area, Right upper leg, Left upper leg, Right lower leg, Face   Body parts bathed by helper: Buttocks, Right lower leg, Left lower leg     Bathing assist Assist Level: Moderate Assistance - Patient 50 - 74%     Upper Body Dressing/Undressing Upper body dressing   What is the patient wearing?: Pull over shirt    Upper body assist Assist Level: Supervision/Verbal cueing    Lower Body Dressing/Undressing Lower body dressing      What is the patient wearing?: Pants     Lower body assist Assist for lower body dressing: Dependent - Patient 0%     Toileting Toileting    Toileting assist Assist for toileting: Maximal Assistance - Patient 25 - 49%     Transfers Chair/bed transfer  Transfers assist     Chair/bed transfer assist level: 2 Helpers     Locomotion Ambulation   Ambulation assist   Ambulation activity did not occur: Safety/medical concerns          Walk 10 feet activity   Assist  Walk 10  feet activity did not occur: Safety/medical concerns        Walk 50 feet activity   Assist Walk 50 feet with 2 turns activity did not occur: Safety/medical concerns         Walk 150 feet activity   Assist Walk 150 feet activity did not occur: Safety/medical concerns         Walk 10 feet on uneven surface  activity   Assist Walk 10 feet on uneven surfaces activity did not occur: Safety/medical concerns         Wheelchair     Assist Will patient use wheelchair at discharge?: Yes Type of Wheelchair: Power    Wheelchair assist level: Supervision/Verbal  cueing Max wheelchair distance: 150'    Wheelchair 50 feet with 2 turns activity    Assist        Assist Level: Supervision/Verbal cueing   Wheelchair 150 feet activity     Assist      Assist Level: Supervision/Verbal cueing   Blood pressure (!) 118/43, pulse 63, temperature 98 F (36.7 C), resp. rate 16, height 5\' 5"  (1.651 m), weight 60.7 kg, SpO2 94 %.  Medical Problem List and Plan: 1.   Tetraparesis secondary to cervical myelopathy -patient may  shower -ELOS/Goals: 20 to 24 days  -Continue CIR 2. Antithrombotics: -DVT/anticoagulation:Mechanical:Sequential compression devices, below kneeBilateral lower extremities. Bilateral lower extremity DVTs, age indeterminate, on . Started on Eliquis.  8/9- needs at least 3 months -antiplatelet therapy: N/A 3. Pain Management:Well controlled with prn Tylenol.  4. Mood:LCSW to follow for evaluation and support. -antipsychotic agents: N/A 5. Neuropsych: This patient is capable of making decisions on her own behalf. 6. Skin/Wound Care:Unstageable- sacral ulcer-Monitor wound for healing. Will add protein supplement and multivitamin to promote healing.  8/20- stage II sacral ulcer- looking stable. Not painful.   -continue turning and local care  8/24- no significant change- staying off backside as much as possible.  8/25- consulted WOC -they think due to MASD- in gluteal fold at coccyx- started Aquacel with foam dressing  8/29: Nurse Trish alerted me that stage 2 pressure injury looks worse. I examined and there is area of darker discoloration at border and nurse notes it is getting larger- wound care consult ordered to see if they have any further care recommendations for 9/29  8/30- Pt's wound is unstageable- yellow/purple- will d/w nursing getting air mattress with SW  8/31/- will order air mattress- Talked with Auria about trying to get air mattress.   -PRAFO's for  heels 7. Fluids/Electrolytes/Nutrition:Monitor I/O. Check lytes in am.  8. HTN: Monitor BP tid--continue HCTZ daily.Check BMET/K+ level in am.  8/20- Off HCTZ- DBP in 40s, but SBP well controlled  8/22 bp controlled 9. Post op labs: Pre-op K+ low normal.   8/20- K+ 3.5- labs q Monday  8/23- K+ 3.7- doing better  8/27- labs Monday  10 Neurogenic bladder: Has been incontinent with external catheter--will monitor voiding with PVRs/bladder scan. UA negative. Continue Toviaz 4mg  daily  8/14: Continues to have urgency, no dysuria. Has sensation of voiding.   8/20- still has 50% of time bladder incontinence- will send to Neuro-Urology after d/c.   8/24- urinary retention occurring- will check U/A and Cx since is new- likely has forming UTI.   8/27- no urinary retention since started Keflex- no in/out caths required in last 24 hours, UC sensitive to Keflex.  8/30- will talk to family about Foley- making wound worse and PVRs 250-300cc most times- would benefit from  Foley for skin protection.   8/31- will place foley 11.Neurogenic bowel: KUB shows large stool burden and no obstruction. Had BM with enema on admission. On daily Miralax, colace TID, Senna HS.  bisacodyl 10mg  supp qpm  8/20- reduced Miralax to daily to help with loose stools- still getting bowel program nightly with lidocaine  8/25- good BM with bowel program last night  8/27- only smear last night- if no good BM tonight, needs some Sorbitol  8/29: Incontinent of small medium BM last night and has smears this morning.  12. Insomnia: Trazodone 100mg  did not provide benefit Try Amitriptyline 25mg .   8/27- poor sleep last night- con't regimen 13. Bradycardia  8/20-21- asymptomatic- running low 60s- con't to monitor 14. Nausea  8/16- give prns meds 15. Sleepiness  8/18- has been really sedated last 2 days- afebrile- doesn't have any new meds- no Sx's of UTI  8/19-21 appears generally improved 16. Vertigo  8/24- will order PT  evaluation for vertigo- sounds like Benign positional vertigo  8/25- will eval, but might not be able to treat due to cervical precautions- FYI 17. Urinary retention/UTI-  8/25- check U/A and Cx  8/26- U/A (+) for UTI- will start Keflex 500 mg TID for now and await Cx results  8/27- 100K E Coli- Cx pending for sensitivities- needs to look  8/30- needs Foley if at all possible to protect skin  8/31- will place foley 18. Dispo  8/19- pt said daughters cannot care for her, but doesn't want to go to SNF- will discuss with pt/daughter this afternoon.   8/20- spoke with pt and daughter for additional 25 minutes- so total care 40 minutes today- as detailed above.   8/26- family plans on taking pt home.     Pt is a tetraplegic- incomplete quadriplegia- with Stage II pressure ulcer and neurogenic bowel and bladder and spasticity- she needs a power w/c to help her do pressure relief since she cannot do in manual w/c- also unable to propel a manual w/c at all- also needs to help her transfer and allow her family to care for her; needs power w/c to get from 1 place to another- she also would benefit from elevating leg rests due to variable LE edema. She REQUIRES a ROHO w/c cushion due to current Stage II pressure ulcer, and needs to do pressure relief every 15 minutes while in the w/c.  Pt also NEEDS a Hoyer lift to transfer her- cannot transfer via transfer board at this time- she requires too much help and so needs 9/19 lift.  Due to stage II pressure ulcer AND being tetraplegic, pt needs hospital bed to help her with turning at home, needs the rails, and to elevate her to help get her in/out of w/c, being level- she is unable to transfer from a regular bed at this time.    LOS: 29 days A FACE TO FACE EVALUATION WAS PERFORMED  Haley Robinson 10/02/2019, 8:41 AM

## 2019-10-02 NOTE — Progress Notes (Signed)
Physical Therapy Session Note  Patient Details  Name: KEMARIA DEDIC MRN: [id number] Date of Birth: [birth year]  Today's Date: 10/02/2019 PT Individual Time: 1130-1155 PT Individual Time Calculation (min): 25 min   Short Term Goals: Week 4:  PT Short Term Goal 1 (Week 4): =LTG due to ELOS  Skilled Therapeutic Interventions/Progress Updates:    Pt received seated in TIS w/c in room, agreeable to PT session. No complaints of pain. Nursing requesting pt return to bed for foley placement. Slide board transfer w/c to bed with assist x 2 for safety. Sit to supine mod A for BLE management. Rolling L/R with min A and use of BUE on bedrails. Pt left in R sidelying in bed with needs in reach at end of session.  Therapy Documentation Precautions:  Precautions Precautions: Fall, Cervical Precaution Booklet Issued: Yes (comment) Precaution Comments: reviewed precautions Required Braces or Orthoses: Cervical Brace Cervical Brace: Soft collar, At all times Restrictions Weight Bearing Restrictions: No    Therapy/Group: Individual Therapy   Peter Congo, PT, DPT  10/02/2019, 11:58 AM

## 2019-10-02 NOTE — Progress Notes (Signed)
Occupational Therapy Session Note  Patient Details  Name: Haley Robinson MRN: [id number] Date of Birth: [birth year]  Today's Date: 10/02/2019 OT Individual Time: 0900-1000 OT Individual Time Calculation (min): 60 min    Short Term Goals: Week 4:  OT Short Term Goal 1 (Week 4): Patient will demonstrate squat-pivot transfers with Max A OT Short Term Goal 2 (Week 4): Pt will maintain dynamic sitting balance EOB/EOM with Min A during functional activities OT Short Term Goal 3 (Week 4): Pt will complete toilting tasks with mod A  Skilled Therapeutic Interventions/Progress Updates:    OT intervention with focus on bed mobility, BADL retraining, sitting balance, SB tranfsers, discharge planning, activity tolerance, and safety awareness.  Bed mobility with min A using bed rails.  LB dressing with max A. Supine>sit EOB with mod A. SB transfer with max A. Pt completed UB bathing/dressing with supervision seated in w/c at sink. Pt ready for discharge home tomorrow with 24 hour care/assistance from family. Pt remained in TIS w/c with all needs within reach. Belt alarm activated.   Therapy Documentation Precautions:  Precautions Precautions: Fall, Cervical Precaution Booklet Issued: Yes (comment) Precaution Comments: reviewed precautions Required Braces or Orthoses: Cervical Brace Cervical Brace: Soft collar, At all times Restrictions Weight Bearing Restrictions: No  Pain: Pain Assessment Pain Score: 0-No pain   Therapy/Group: Individual Therapy  Rich Brave 10/02/2019, 10:05 AM

## 2019-10-02 NOTE — Progress Notes (Signed)
Bowel program performed as ordered. No stool produced immediately.

## 2019-10-02 NOTE — Progress Notes (Signed)
Occupational Therapy Session Note  Patient Details  Name: Haley Robinson MRN: [id number] Date of Birth: 05-27-1940  Today's Date: 10/02/2019 OT Individual Time: 1400-1425 OT Individual Time Calculation (min): 25 min    Short Term Goals: Week 4:  OT Short Term Goal 1 (Week 4): Patient will demonstrate squat-pivot transfers with Max A OT Short Term Goal 2 (Week 4): Pt will maintain dynamic sitting balance EOB/EOM with Min A during functional activities OT Short Term Goal 3 (Week 4): Pt will complete toilting tasks with mod A  Skilled Therapeutic Interventions/Progress Updates:    Pt resting in bed upon arrival.  OT intervention with focus on bed mobility, sitting balance, education, and safety awareness to prepare for discharge tomorrow. Rolling R<>L with bed rails at min A. Static sitting balance EOB with CGA/supervision.  Ongoing education regarding postioning in bed and power w/c. Pt voiced concerns that she will be a burden on family. Emotional support provided. Pt remained in bed with all needs within reach and bed alarm activated.   Therapy Documentation Precautions:  Precautions Precautions: Fall, Cervical Precaution Booklet Issued: Yes (comment) Precaution Comments: reviewed precautions Required Braces or Orthoses: Cervical Brace Cervical Brace: Soft collar, At all times Restrictions Weight Bearing Restrictions: No   Pain:  Pt denies pain this afternoon   Therapy/Group: Individual Therapy  Rich Brave 10/02/2019, 2:30 PM

## 2019-10-02 NOTE — Patient Care Conference (Signed)
Inpatient RehabilitationTeam Conference and Plan of Care Update Date: 10/02/2019   Time: 11:17 AM    Patient Name: Haley Robinson      Medical Record Number: [id number]  Date of Birth: 1940-02-23 Sex: Female         Room/Bed: 4M03C/4M03C-01 Payor Info: Payor: [employer] MEDICARE / Plan: [employer] MEDICARE CHOICE PPO / Product Type: *No Product type* /    Admit Date/Time:  09/03/2019  5:32 PM  Primary Diagnosis:  Cervical myelopathy [facility])  Hospital Problems: Principal Problem:   Cervical myelopathy (HCC) Active Problems:   Cervical arthritis with myelopathy   Pressure injury of skin    Expected Discharge Date: Expected Discharge Date: 10/03/19  Team Members Present: Physician leading conference: Dr. Genice Rouge Care Coodinator Present: Cecile Sheerer, LCSWA;Twisha Vanpelt Marlyne Beards, RN, BSN, CRRN Nurse Present: Regino Schultze, RN PT Present: Otelia Sergeant, PT OT Present: Roney Mans, OT;Ardis Rowan, COTA PPS Coordinator present : Edson Snowball, Park Breed, SLP     Current Status/Progress Goal Weekly Team Focus  Bowel/Bladder   Pt incontinent of B/B. Bowel program LBM 10/02/2019. I&O caths intermittently.  Continue bowel program  Assess B/B every shift and prn   Swallow/Nutrition/ Hydration             ADL's   bed mobility-min A for rolling, mod/max A for supine>sit EOB; slide board transfers with max A; LB bathing/dressing-max A; UB bathing/dressing in w/c-supervision;  mod A overall  dishcharge planning, education   Mobility   min A rolling, mod to max A supine to/from sit, mod A SB transfer vs hoyer with family. Supervision PWC  dependent transfers via lift, mod I PWC mobility, min A bed mobility  family education, d/c planning   Communication             Safety/Cognition/ Behavioral Observations            Pain   Pt consistently denies pain  Pain level <3  Assess pain every shift and PRN   Skin   Unstageable Pressure ulcer to coccyx  improvement of pressure ulcer of  coccyx. No new breakdown  Assess skin every shift and PRN Turn pt every 2 hours     Discharge Planning:  Pt daughter [name] and husband live with pt in the home. Both daughters- [name] and [name] will alternate their time with assisting with care. Fam education completed.   Team Discussion: Bowel program going well, nursing to place foley today. Family education complete, patient ready for discharge. Patient on target to meet rehab goals: yes  *See Care Plan and progress notes for long and short-term goals.   Revisions to Treatment Plan:  None  Teaching Needs: Family education complete.  Current Barriers to Discharge: Neurogenic bowel and bladder.  Possible Resolutions to Barriers: All family education complete.     Medical Summary Current Status: got air mattress for today- to place foley- family ed taught bowel program- w/c paperwork signed- hospital bed/power w/c to be delivered  Barriers to Discharge: Wound care;Weight bearing restrictions;Incontinence;Neurogenic Bowel & Bladder  Barriers to Discharge Comments: chronic foley; incopmplete quadriplegia- d/c tomorrow Possible Resolutions to [employer] Focus: all family ed done   Continued Need for Acute Rehabilitation Level of Care: The patient requires daily medical management by a physician with specialized training in physical medicine and rehabilitation for the following reasons: Direction of a multidisciplinary physical rehabilitation program to maximize functional independence : Yes Medical management of patient stability for increased activity during participation in an intensive rehabilitation regime.: Yes  Analysis of laboratory values and/or radiology reports with any subsequent need for medication adjustment and/or medical intervention. : Yes   I attest that I was present, lead the team conference, and concur with the assessment and plan of the team.   Tennis Must 10/02/2019, 2:58 PM

## 2019-10-02 NOTE — Progress Notes (Signed)
Physical Therapy Discharge Summary  Patient Details  Name: Haley Robinson MRN: [id number] Date of Birth: Apr 30, 1940  Today's Date: 10/02/2019 PT Individual Time: 1300-1401 PT Individual Time Calculation (min): 61 min   Interventions:  pt received in bed and agreeable to therapy however requested to sit EOB and not transfer back to [facility] as she wanted to remain in bed at end of session. Pt directed in supine>sit EOB mod A with (+) bedrails and VC for technique. Pt directed in EOB sitting balance with BUE in lap on air mattress at CGA with min A for righting as needed however pt demonstrated few LOBs VC and visual demonstration for midline sitting and improved hip alignment, 5x1 min with rest breaks between reps 2/2 fatigue. Pt directed in reaching with alternating BUE in multiple planed directions with x4 LOBs and Min A to right. Pt returned to supine, mod A with HOB elevated and VC for technique. Pt required max A for upward scooting for positioning and pillow under hip for improved skin integrity. Pt left in bed, All needs in reach and in good condition. Call light in hand.    Patient has met 4 of 8 long term goals due to improved activity tolerance, improved balance, improved postural control and increased strength.  Patient to discharge at an ambulatory level Total Assist.   Patient's care partner is independent to provide the necessary physical assistance at discharge.  Reasons goals not met: pt continues to demonstrate need of supervision for power wheelchair use in home and community environment for safety, determined unsafe to participate in STS and family educated on safety and recommendations of use of hoyer lift for home use. Pt has poor BLE strength and coordination which limits transfers and gait training pt's trunk control is also limited which leads to her difficulties and needs of assistance in unsupported sitting balance and transfers.   Recommendation:  Patient will benefit from  ongoing skilled PT services in home health setting to continue to advance safe functional mobility, address ongoing impairments in trunk stability, sitting balance in static and dynamic, transfers, endurance, BLE strength and coordination, and bed mobility and minimize fall risk.  Equipment: power wheelchair, hospital bed, slide board, hoyer lift  Reasons for discharge: discharge from hospital  Patient/family agrees with progress made and goals achieved: Yes  PT Discharge Precautions/Restrictions   Vital Signs Therapy Vitals Temp: 98.2 F (36.8 C) Pulse Rate: 72 Resp: 18 BP: 126/69 Patient Position (if appropriate): Lying Oxygen Therapy SpO2: 96 % O2 Device: Room Air Pain   Vision/Perception  Perception Perception: Within Functional Limits Praxis Praxis: Intact  Cognition Overall Cognitive Status: Within Functional Limits for tasks assessed Arousal/Alertness: Awake/alert Orientation Level: Oriented X4 Attention: Focused;Sustained Focused Attention: Appears intact Sustained Attention: Appears intact Memory: Appears intact Awareness: Appears intact Problem Solving: Appears intact Safety/Judgment: Appears intact Sensation Sensation Light Touch: Impaired Detail Light Touch Impaired Details: Impaired RLE;Impaired LLE Proprioception: Impaired Detail Proprioception Impaired Details: Impaired RLE;Impaired LLE Stereognosis: Impaired by gross assessment Additional Comments: Impaired sensation bilaterally. Coordination Gross Motor Movements are Fluid and Coordinated: No Fine Motor Movements are Fluid and Coordinated: No Coordination and Movement Description: impaired 2/2 incomplete quadraplegia Finger Nose Finger Test: Impaired bilaterally Motor  Motor Motor: Tetraplegia;Abnormal tone;Abnormal postural alignment and control Motor - Skilled Clinical Observations: impaired 2/2 incomplete quadraplegia  Mobility Bed Mobility Bed Mobility: Rolling Right;Rolling Left;Supine  to Sit;Sit to Supine Rolling Right: Moderate Assistance - Patient 50-74% Rolling Left: Moderate Assistance - Patient 50-74% Right Sidelying to Sit:  Moderate Assistance - Patient 50-74% Supine to Sit: Other (comment);Maximal Assistance - Patient - Patient 25-49% (with hospitabl bed functions and bed rails) Sitting - Scoot to Edge of Bed: Maximal Assistance - Patient 25-49% Sit to Supine: Maximal Assistance - Patient 25-49% Transfers Transfers: Lateral/Scoot Transfers Squat Pivot Transfers:  (slide board) Lateral/Scoot Transfers: Moderate Assistance - Patient 50-74% Transfer (Assistive device):  (slide board) Locomotion  Gait Ambulation: No Gait Gait: No Stairs / Additional Locomotion Stairs: No Wheelchair Mobility Wheelchair Mobility: Yes Wheelchair Assistance: Chartered loss adjuster: Power Wheelchair Parts Management: Supervision/cueing  Trunk/Postural Assessment  Cervical Assessment Cervical Assessment:  (cervical precautions; soft collar) Thoracic Assessment Thoracic Assessment: Exceptions to [facility] (rounded shoulders) Lumbar Assessment Lumbar Assessment: Exceptions to [facility] (posterior pelvic tilt) Postural Control Postural Control: Deficits on evaluation Trunk Control: CGA static sitting balance; min A dynamic to L and R however intermittent mod A to R 2/2 fatigue Righting Reactions: poor  Balance Balance Balance Assessed: Yes Static Sitting Balance Static Sitting - Balance Support: Feet supported Static Sitting - Level of Assistance: 5: Stand by assistance Static Sitting - Comment/# of Minutes: pt able to sit statically for 1-3 mins at a time without BUE support CGA Dynamic Sitting Balance Dynamic Sitting - Balance Support: During functional activity Dynamic Sitting - Level of Assistance: 3: Mod assist Dynamic Sitting Balance - Compensations: with reaching in various directions pt demonstrates increased difficulty with reaching toward R>L and  intermittently mod A to correct on R and min A on L Dynamic Sitting - Balance Activities: Reaching for objects;Trunk control activities;Forward lean/weight shifting;Lateral lean/weight shifting;Reaching across midline Static Standing Balance Static Standing - Comment/# of Minutes: pt unable to functionally stand without dependent need of standing frame Extremity Assessment  RUE Assessment Passive Range of Motion (PROM) Comments: [facility] General Strength Comments: Shoulder/elbow/wrist 3+/5 1/5 at triceps 3-/5 at digits for composite flexion. LUE Assessment Passive Range of Motion (PROM) Comments: [facility] General Strength Comments: Shoulder/elbow/wrist 3+/5 1/5 at triceps 3-/5 at digits for composite flexion. RLE Assessment Passive Range of Motion (PROM) Comments: [facility] General Strength Comments: impaired, see below RLE Strength Right Hip Flexion: 1/5 Right Knee Flexion: 1/5 Right Knee Extension: 1/5 Right Ankle Dorsiflexion: 0/5 LLE Assessment LLE Assessment: Exceptions to [facility] Passive Range of Motion (PROM) Comments: [facility] General Strength Comments: impaired, see below LLE Strength Left Hip Flexion: 3/5 Left Knee Flexion: 3+/5 Left Knee Extension: 5/5 Left Ankle Dorsiflexion: 3/5    Junie Panning 10/02/2019, 3:48 PM

## 2019-10-02 NOTE — Progress Notes (Signed)
Occupational Therapy Discharge Summary  Patient Details  Name: Haley Robinson MRN: [id number] Date of Birth: [birth year]  Patient has met 5 of 7 long term goals due to improved activity tolerance, improved balance, postural control, ability to compensate for deficits.  Pt made slow progress with BADLs during this admission. Pt requires max A for LB dressing tasks at bed level. Pt completes LB bathing tasks at bed level with mod A. Pt completes UB bathing/dressing seated in w/c with supervision.  Pt with limited RUE grasp, primarily utilizing tenodesis to use RUE as gross assist during bathing/dressing tasks. Pt uses LUE at diminished functional level.  Self feeding with setup. Pt requires min/mod A for dynamic sitting balance EOB. Pt performs SB transfers with max A +2 for safety. Pt's daughters have been present and performed [facility] Lift transfers independently. Pt's daughter have participated in bathing/dressing tasks and positioning in bed. Patient to d/c home with 24hr supervision/assist form family who is also able to assist with BADLs/IADLs including meal prep and housekeeping.   Reasons goals not met: Toilet transfer goal not met as patient utilizes [employer] fast streamer. Patient also did not meet LB dressing goal as she continues to require Max A for threading BLE and hiking pants over hips at bed level.   Recommendation:  Patient will benefit from ongoing skilled OT services in home health setting to continue to advance functional skills in the area of BADL.  Equipment: [employer] account planner, hospital bed  Reasons for discharge: discharge from hospital  Patient/family agrees with progress made and goals achieved: Yes  OT Discharge Vision Baseline Vision/History: No visual deficits Patient Visual Report: No change from baseline Vision Assessment?: No apparent visual deficits Perception  Perception: Within Functional Limits Praxis Praxis: Intact Cognition Overall Cognitive Status: Within  Functional Limits for tasks assessed Arousal/Alertness: Awake/alert Orientation Level: Oriented X4 Attention: Focused;Sustained Focused Attention: Appears intact Sustained Attention: Appears intact Memory: Appears intact Awareness: Appears intact Problem Solving: Appears intact Safety/Judgment: Appears intact Sensation Sensation Light Touch: Impaired Detail Light Touch Impaired Details: Impaired RLE;Impaired LLE Proprioception: Impaired Detail Proprioception Impaired Details: Impaired RLE;Impaired LLE Stereognosis: Impaired by gross assessment Additional Comments: Impaired sensation bilaterally. Coordination Gross Motor Movements are Fluid and Coordinated: No Fine Motor Movements are Fluid and Coordinated: No Coordination and Movement Description: impaired 2/2 incomplete quadraplegia Finger Nose Finger Test: Impaired bilaterally Motor  Motor Motor: Tetraplegia;Abnormal tone;Abnormal postural alignment and control Motor - Skilled Clinical Observations: impaired 2/2 incomplete quadraplegia Mobility  Bed Mobility Bed Mobility: Rolling Right;Rolling Left;Supine to Sit;Sit to Supine Rolling Right: Moderate Assistance - Patient 50-74% Rolling Left: Moderate Assistance - Patient 50-74% Right Sidelying to Sit: Moderate Assistance - Patient 50-74% Supine to Sit: Other (comment);Maximal Assistance - Patient - Patient 25-49% (with hospitabl bed functions and bed rails) Sitting - Scoot to Edge of Bed: Maximal Assistance - Patient 25-49% Sit to Supine: Maximal Assistance - Patient 25-49%  Trunk/Postural Assessment  Cervical Assessment Cervical Assessment:  (cervical precautions; soft collar) Thoracic Assessment Thoracic Assessment: Exceptions to [facility] (rounded shoulders) Lumbar Assessment Lumbar Assessment: Exceptions to [facility] (posterior pelvic tilt) Postural Control Postural Control: Deficits on evaluation Trunk Control: CGA static sitting balance; min A dynamic to L and R however  intermittent mod A to R 2/2 fatigue Righting Reactions: poor  Balance Balance Balance Assessed: Yes Static Sitting Balance Static Sitting - Balance Support: Feet supported Static Sitting - Level of Assistance: 5: Stand by assistance Static Sitting - Comment/# of Minutes: pt able to sit statically for 1-3 mins  at a time without BUE support CGA Dynamic Sitting Balance Dynamic Sitting - Balance Support: During functional activity Dynamic Sitting - Level of Assistance: 3: Mod assist Dynamic Sitting Balance - Compensations: with reaching in various directions pt demonstrates increased difficulty with reaching toward R>L and intermittently mod A to correct on R and min A on L Dynamic Sitting - Balance Activities: Reaching for objects;Trunk control activities;Forward lean/weight shifting;Lateral lean/weight shifting;Reaching across midline Static Standing Balance Static Standing - Comment/# of Minutes: pt unable to functionally stand without dependent need of standing frame Extremity/Trunk Assessment RUE Assessment Passive Range of Motion (PROM) Comments: [facility] General Strength Comments: Shoulder/elbow/wrist 3+/5 1/5 at triceps 3-/5 at digits for composite flexion. LUE Assessment Passive Range of Motion (PROM) Comments: [facility] General Strength Comments: Shoulder/elbow/wrist 3+/5 1/5 at triceps 3-/5 at digits for composite flexion.   Leotis Shames [facility] 10/02/2019, 2:38 PM

## 2019-10-03 DIAGNOSIS — I82409 Acute embolism and thrombosis of unspecified deep veins of unspecified lower extremity: Secondary | ICD-10-CM

## 2019-10-03 DIAGNOSIS — N390 Urinary tract infection, site not specified: Secondary | ICD-10-CM

## 2019-10-03 MED ORDER — CEPHALEXIN 500 MG PO CAPS: 500.0000 mg | ORAL_CAPSULE | Freq: Two times a day (BID) | ORAL | 0 refills | Status: DC

## 2019-10-03 MED ORDER — APIXABAN 5 MG PO TABS: 5.0000 mg | ORAL_TABLET | Freq: Two times a day (BID) | ORAL | 0 refills | Status: AC

## 2019-10-03 NOTE — Discharge Summary (Signed)
Physician Discharge Summary  Patient ID: Haley Robinson MRN: [id number] DOB/AGE: 1940-08-10 79 y.o.  Admit date: 09/03/2019 Discharge date: 10/03/2019  Discharge Diagnoses:  Principal Problem:   Cervical myelopathy (HCC) Active Problems:   Cervical arthritis with myelopathy   Pressure injury of skin   E. coli UTI   DVT of lower extremity (deep venous thrombosis) (HCC)   Discharged Condition: stable  Significant Diagnostic Studies: VAS [place] LOWER EXTREMITY VENOUS (DVT)  Result Date: 09/04/2019  Lower Venous DVTStudy Indications: Swelling, and quadrapalegia.  Risk Factors: Surgery 08/28/19. Performing Technologist: Levada Schilling RDMS, RVT  Examination Guidelines: A complete evaluation includes B-mode imaging, spectral Doppler, color Doppler, and power Doppler as needed of all accessible portions of each vessel. Bilateral testing is considered an integral part of a complete examination. Limited examinations for reoccurring indications may be performed as noted. The reflux portion of the exam is performed with the patient in reverse Trendelenburg.  +---------------+---------------+---------+-----------+----------+-------------+ RIGHT          CompressibilityPhasicitySpontaneityPropertiesThrombus                                                                  Aging         +---------------+---------------+---------+-----------+----------+-------------+ CFV            Full           Yes      Yes                                +---------------+---------------+---------+-----------+----------+-------------+ SFJ            Full                                                       +---------------+---------------+---------+-----------+----------+-------------+ FV Prox        Full                                                       +---------------+---------------+---------+-----------+----------+-------------+ FV Mid         Full                                                        +---------------+---------------+---------+-----------+----------+-------------+ FV Distal      Full                                                       +---------------+---------------+---------+-----------+----------+-------------+ PFV            Full                                                       +---------------+---------------+---------+-----------+----------+-------------+  POP            Full           Yes      Yes                                +---------------+---------------+---------+-----------+----------+-------------+ PTV            Full                                                       +---------------+---------------+---------+-----------+----------+-------------+ PERO           Full                                                       +---------------+---------------+---------+-----------+----------+-------------+ Posteriortibial                                   brightly  Age           trunk                                             echogenic Indeterminate +---------------+---------------+---------+-----------+----------+-------------+ The right popliteal vein is negative for thrombus although one branch of the trifrucation is positive for thrombus.  +---------+---------------+---------+-----------+------------+-----------------+ LEFT     CompressibilityPhasicitySpontaneityProperties  Thrombus Aging    +---------+---------------+---------+-----------+------------+-----------------+ CFV      Full           Yes      Yes                                      +---------+---------------+---------+-----------+------------+-----------------+ SFJ      Full                                                             +---------+---------------+---------+-----------+------------+-----------------+ FV Prox  Full                                                              +---------+---------------+---------+-----------+------------+-----------------+ FV Mid   Full                                                             +---------+---------------+---------+-----------+------------+-----------------+ FV DistalFull                                                             +---------+---------------+---------+-----------+------------+-----------------+  PFV      Full                                                             +---------+---------------+---------+-----------+------------+-----------------+ POP      Full           Yes      Yes                                      +---------+---------------+---------+-----------+------------+-----------------+ PTV      Full                                                             +---------+---------------+---------+-----------+------------+-----------------+ PERO     Full                                                             +---------+---------------+---------+-----------+------------+-----------------+ Gastroc  Partial                            softly      Age Indeterminate                                             echogenic                     +---------+---------------+---------+-----------+------------+-----------------+ Thrombus noted in the intermuscular veins of the proximal left calf.    Summary: RIGHT: - No cystic structure found in the popliteal fossa. - Age indetermiate thrombus noted in the right posteriortibial trunk.  LEFT: - No cystic structure found in the popliteal fossa. - Age indeterminate thrombus noted in the proximal intermuscular veins of the left calf.  *See table(s) above for measurements and observations. Electronically signed by Waverly Ferrari MD on 09/04/2019 at 8:06:45 PM.    Final     Labs:  Basic Metabolic Panel: BMP Latest Ref Rng & Units 10/01/2019 09/24/2019 09/17/2019  Glucose 70 - 99 mg/dL 295(M) 841(L) 244(W)  BUN 8 - 23  mg/dL 10 12 16   Creatinine 0.44 - 1.00 mg/dL 1.02 7.25  BUN/Creat Ratio 12 - 28 - - -  Sodium 135 - 145 mmol/L 142 140 139  Potassium 3.5 - 5.1 mmol/L 3.6 3.7 3.5  Chloride 98 - 111 mmol/L 104 103 101  CO2 22 - 32 mmol/L 32 28 29  Calcium 8.9 - 10.3 mg/dL 9.2 3.66) 4.4(I)    CBC: CBC Latest Ref Rng & Units 10/01/2019 09/24/2019 09/17/2019  WBC 4.0 - 10.5 K/uL 7.7 7.1 6.7  Hemoglobin 12.0 - 15.0 g/dL 11.1(L) 10.5(L) 10.3(L)  Hematocrit 36 - 46 % 34.5(L) 32.5(L) 32.1(L)  Platelets 150 - 400 K/uL 229 272  268    CBG: No results for input(s): GLUCAP in the last 168 hours.  Brief HPI:   ANETA HENDERSHOTT is a 79 y.o. female with history of HTN, numbness and weakness bilateral hands since 2000 but started developing progressive symptoms with gait instability and spasticity L>R. Work up revealed severe cervical stenosis with myelopathy C4/5 to C6/7 and was admitted on 08/28/19 for ACDF C4-C7 by Dr. Jordan Likes. Post op has had BLE weakness with spasms RLE and sensory deficits. Steroids used briefly without improvement. Patient continued to have deficits in mobility and ADLs due to tetraplegia. CIR was recommended due to functional decline.    Hospital Course: REESE SENK was admitted to rehab 09/03/2019 for inpatient therapies to consist of PT and OT at least three hours five days a week. Past admission physiatrist, therapy team and rehab RN have worked together to provide customized collaborative inpatient rehab. BLE dopplers were showed indeterminate thrombus in right posterior tibial trunk as well as proximal intermuscular veins of left calf. She was started on Eliquis for treatment on 08/03 and recommend to continue [facility] for at least three months. Her blood pressures were monitored on TID basis and HCTZ was discontinued due to low BP. Post op check BMET showed lytes and renal status to be WNL. Follow up CBC showed that H/H is improving.  KUB done at admission showed large stool burden and she was started  on bowel program with good results.   She continued to have issues with urinary incontinence with boderline volumes and developed stage 2 sacral ulcer felt to be due to MASD. Toviaz added to help with incontinence. She developed urinary retention requiring I/O caths and was found to have E coli UTI. She was started on Keflex and is to continue antibiotics to complete 10 day course treatment.   Pressure area on sacrum did worsen to unstageable wound in part due to bladder incontinence.  Foley was placed on 08/31 to protect skin and to prevent worsening of wound. Air mattress also ordered for pressure relief measures. Neck incision is C/D/I and is healing well. She has made slow progress and continues to be limited by tetraplegia and will continue to receive follow up HHPT, HHOT and HHaide after discharge.   Rehab course: During patient's stay in rehab weekly team conferences were held to monitor patient's progress, set goals and discuss barriers to discharge. At admission, patient required total assist with ADL tasks and with mobility. She  has had improvement in activity tolerance, balance, postural control as well as ability to compensate for deficits. She requires mod assist with LB bathing and max assist for LB dressing. She is able to sit at EOB with CGA to min assist. She requires mod to max assist for bed mobility. Family education complede   Disposition: Home  Diet: Regular  Special Instructions: 1. Needs to side lie in bed and boost every 30 minutes when in chair. 2. Perform foley care bid and bowel program after supper.    Discharge Instructions    Ambulatory referral to Physical Medicine Rehab   Complete by: As directed    1-2 weeks TC appointment/May need virtual visit     Allergies as of 10/03/2019   No Known Allergies     Medication List    STOP taking these medications   dexamethasone 1 MG tablet Commonly known as: DECADRON   hydrochlorothiazide 12.5 MG tablet Commonly  known as: HYDRODIURIL   HYDROcodone-acetaminophen 10-325 MG tablet Commonly known as: NORCO  meclizine 12.5 MG tablet Commonly known as: ANTIVERT     TAKE these medications   acetaminophen 325 MG tablet Commonly known as: TYLENOL Take 1-2 tablets (325-650 mg total) by mouth every 4 (four) hours as needed for mild pain.   amitriptyline 25 MG tablet Commonly known as: ELAVIL Take 1 tablet (25 mg total) by mouth at bedtime.   apixaban 5 MG Tabs tablet Commonly known as: ELIQUIS Take 1 tablet (5 mg total) by mouth 2 (two) times daily.   ascorbic acid 500 MG tablet Commonly known as: VITAMIN C Take 1 tablet (500 mg total) by mouth 2 (two) times daily. Notes to patient: To help with wound healing   aspirin 81 MG EC tablet Take 1 tablet (81 mg total) by mouth daily.   cephALEXin 500 MG capsule Commonly known as: KEFLEX Take 1 capsule (500 mg total) by mouth every 12 (twelve) hours.   cyclobenzaprine 10 MG tablet Commonly known as: FLEXERIL Take 1 tablet (10 mg total) by mouth 3 (three) times daily as needed for muscle spasms.   fesoterodine 4 MG Tb24 tablet Commonly known as: TOVIAZ Take 1 tablet (4 mg total) by mouth daily.   melatonin 5 MG Tabs Take 1 tablet (5 mg total) by mouth at bedtime as needed (insomnia).   polyethylene glycol 17 g packet Commonly known as: MIRALAX / GLYCOLAX Take 17 g by mouth daily.   senna 8.6 MG Tabs tablet Commonly known as: SENOKOT Take 2 tablets (17.2 mg total) by mouth daily.   simvastatin 40 MG tablet Commonly known as: ZOCOR Take 1 tablet (40 mg total) by mouth daily at 6 PM.   traZODone 100 MG tablet Commonly known as: DESYREL Take 1 tablet (100 mg total) by mouth at bedtime.       Follow-up Information    Lovorn, Aundra Millet, MD Follow up.   Specialty: Physical Medicine and Rehabilitation Why: office will call you with follow up appointment Contact information: [address] [place]  [address] [phone]        Marcell Anger, NP. Call on 10/03/2019.   Specialty: Internal Medicine Why: for post hospital follow up Contact information: [address] [place] [address] [phone]        Julio Sicks, MD. Call.   Specialty: Neurosurgery Why: for post op check Contact information: [address] [place] [address] [phone]               Signed: Jacquelynn Cree 10/03/2019, 10:31 PM

## 2019-10-03 NOTE — Progress Notes (Signed)
[facility] PHYSICAL MEDICINE & REHABILITATION PROGRESS NOTE   Subjective/Complaints:  Pt reports d/c today- ready to leave.   Will need to arrange f/u- might need video since doesn't have easy way to get to appointment currently.    ROS:   Pt denies SOB, abd pain, CP, N/V/C/D, and vision changes   Objective:   No results found. Recent Labs    10/01/19 0453  WBC 7.7  HGB 11.1*  HCT 34.5*  PLT 229   Recent Labs    10/01/19 0453  NA 142  K 3.6  CL 104  CO2 32  GLUCOSE 100*  BUN 10  CREATININE 0.69  CALCIUM 9.2    Intake/Output Summary (Last 24 hours) at 10/03/2019 0750 Last data filed at 10/02/2019 1900 Gross per 24 hour  Intake 476 ml  Output 800 ml  Net -324 ml     Physical Exam: Vital Signs Blood pressure (!) 105/41, pulse 70, temperature 97.8 F (36.6 C), resp. rate 16, height 5\' 5"  (1.651 m), weight 60.7 kg, SpO2 94 %. General: awake, alert, appropriate, NAD HEENT: conjugate gaze,  Neck: Supple without JVD or lymphadenopathy Heart: RRR Chest: CTA B/L- no W/R/R- good air movement Abdomen: Soft, NT, ND, (+)BS  Extremities: No clubbing, cyanosis, or edema. Pulses are 2+ Psych: flat affect- still upset about dx Skin: DTI with unstageable wound on sacrum- yellow/purple- larger now  Neurologic: spasms seen, RLE>LLE- still seen again this AM RLE- 0 to tr/5- still Musculoskeletal: No pain with active assisted as well as passive range of motion in the upper lower limb no joint swelling. Right heel a little sore, skin intact as above Sensation intact throughout   Assessment/Plan: 1. Functional deficits secondary to tetraparesis secondary to cervical myelopathy which require 3+ hours per day of interdisciplinary therapy in a comprehensive inpatient rehab setting.  Physiatrist is providing close team supervision and 24 hour management of active medical problems listed below.  Physiatrist and rehab team continue to assess barriers to discharge/monitor patient  progress toward functional and medical goals  Care Tool:  Bathing    Body parts bathed by patient: Right arm, Left arm, Chest, Abdomen, Front perineal area, Right upper leg, Left upper leg, Left lower leg, Face   Body parts bathed by helper: Buttocks, Right lower leg     Bathing assist Assist Level: Minimal Assistance - Patient > 75%     Upper Body Dressing/Undressing Upper body dressing   What is the patient wearing?: Pull over shirt    Upper body assist Assist Level: Supervision/Verbal cueing    Lower Body Dressing/Undressing Lower body dressing      What is the patient wearing?: Pants     Lower body assist Assist for lower body dressing: Maximal Assistance - Patient 25 - 49%     Toileting Toileting    Toileting assist Assist for toileting: Dependent - Patient 0%     Transfers Chair/bed transfer  Transfers assist     Chair/bed transfer assist level: 2 Helpers     Locomotion Ambulation   Ambulation assist   Ambulation activity did not occur: Safety/medical concerns          Walk 10 feet activity   Assist  Walk 10 feet activity did not occur: Safety/medical concerns        Walk 50 feet activity   Assist Walk 50 feet with 2 turns activity did not occur: Safety/medical concerns         Walk 150 feet activity   Assist  Walk 150 feet activity did not occur: Safety/medical concerns         Walk 10 feet on uneven surface  activity   Assist Walk 10 feet on uneven surfaces activity did not occur: Safety/medical concerns         Wheelchair     Assist Will patient use wheelchair at discharge?: Yes Type of Wheelchair: Power    Wheelchair assist level: Supervision/Verbal cueing Max wheelchair distance: 150'    Wheelchair 50 feet with 2 turns activity    Assist        Assist Level: Supervision/Verbal cueing   Wheelchair 150 feet activity     Assist      Assist Level: Supervision/Verbal cueing   Blood  pressure (!) 105/41, pulse 70, temperature 97.8 F (36.6 C), resp. rate 16, height 5\' 5"  (1.651 m), weight 60.7 kg, SpO2 94 %.  Medical Problem List and Plan: 1.   Tetraparesis secondary to cervical myelopathy -patient may  shower -ELOS/Goals: 20 to 24 days  -Continue CIR 2. Antithrombotics: -DVT/anticoagulation:Mechanical:Sequential compression devices, below kneeBilateral lower extremities. Bilateral lower extremity DVTs, age indeterminate, on . Started on Eliquis.  8/9- needs at least 3 months -antiplatelet therapy: N/A 3. Pain Management:Well controlled with prn Tylenol.  4. Mood:LCSW to follow for evaluation and support. -antipsychotic agents: N/A 5. Neuropsych: This patient is capable of making decisions on her own behalf. 6. Skin/Wound Care:Unstageable- sacral ulcer-Monitor wound for healing. Will add protein supplement and multivitamin to promote healing.  8/20- stage II sacral ulcer- looking stable. Not painful.   -continue turning and local care  8/24- no significant change- staying off backside as much as possible.  8/25- consulted WOC -they think due to MASD- in gluteal fold at coccyx- started Aquacel with foam dressing  8/29: Nurse Trish alerted me that stage 2 pressure injury looks worse. I examined and there is area of darker discoloration at border and nurse notes it is getting larger- wound care consult ordered to see if they have any further care recommendations for 9/29  8/30- Pt's wound is unstageable- yellow/purple- will d/w nursing getting air mattress with SW  8/31/- will order air mattress- Talked with Auria about trying to get air mattress.   9/1- will arrange to stay off backside at home- but she won't stay off here, fyi.   -PRAFO's for heels 7. Fluids/Electrolytes/Nutrition:Monitor I/O. Check lytes in am.  8. HTN: Monitor BP tid--continue HCTZ daily.Check BMET/K+ level in am.  8/20- Off HCTZ- DBP in  40s, but SBP well controlled  8/22 bp controlled 9. Post op labs: Pre-op K+ low normal.   8/20- K+ 3.5- labs q Monday  8/23- K+ 3.7- doing better  8/27- labs Monday  10 Neurogenic bladder: Has been incontinent with external catheter--will monitor voiding with PVRs/bladder scan. UA negative. Continue Toviaz 4mg  daily  8/14: Continues to have urgency, no dysuria. Has sensation of voiding.   8/20- still has 50% of time bladder incontinence- will send to Neuro-Urology after d/c.   8/24- urinary retention occurring- will check U/A and Cx since is new- likely has forming UTI.   8/27- no urinary retention since started Keflex- no in/out caths required in last 24 hours, UC sensitive to Keflex.  8/30- will talk to family about Foley- making wound worse and PVRs 250-300cc most times- would benefit from Foley for skin protection.   8/31- will place foley 11.Neurogenic bowel: KUB shows large stool burden and no obstruction. Had BM with enema on admission. On daily Miralax, colace  TID, Senna HS.  bisacodyl 10mg  supp qpm  8/20- reduced Miralax to daily to help with loose stools- still getting bowel program nightly with lidocaine  8/25- good BM with bowel program last night  8/27- only smear last night- if no good BM tonight, needs some Sorbitol  8/29: Incontinent of small medium BM last night and has smears this morning.  12. Insomnia: Trazodone 100mg  did not provide benefit Try Amitriptyline 25mg .   8/27- poor sleep last night- con't regimen 13. Bradycardia  8/20-21- asymptomatic- running low 60s- con't to monitor 14. Nausea  8/16- give prns meds 15. Sleepiness  8/18- has been really sedated last 2 days- afebrile- doesn't have any new meds- no Sx's of UTI  8/19-21 appears generally improved 16. Vertigo  8/24- will order PT evaluation for vertigo- sounds like Benign positional vertigo  8/25- will eval, but might not be able to treat due to cervical precautions- FYI 17. Urinary retention/UTI-  8/25-  check U/A and Cx  8/26- U/A (+) for UTI- will start Keflex 500 mg TID for now and await Cx results  8/27- 100K E Coli- Cx pending for sensitivities- needs to look  8/30- needs Foley if at all possible to protect skin  8/31- will place foley 18. Dispo  8/19- pt said daughters cannot care for her, but doesn't want to go to SNF- will discuss with pt/daughter this afternoon.   8/20- spoke with pt and daughter for additional 25 minutes- so total care 40 minutes today- as detailed above.   8/26- family plans on taking pt home.    9/1- d/c today.   Pt is a tetraplegic- incomplete quadriplegia- with Stage II pressure ulcer and neurogenic bowel and bladder and spasticity- she needs a power w/c to help her do pressure relief since she cannot do in manual w/c- also unable to propel a manual w/c at all- also needs to help her transfer and allow her family to care for her; needs power w/c to get from 1 place to another- she also would benefit from elevating leg rests due to variable LE edema. She REQUIRES a ROHO w/c cushion due to current Stage II pressure ulcer, and needs to do pressure relief every 15 minutes while in the w/c.  Pt also NEEDS a Hoyer lift to transfer her- cannot transfer via transfer board at this time- she requires too much help and so needs 9/20 lift.  Due to stage II pressure ulcer AND being tetraplegic, pt needs hospital bed to help her with turning at home, needs the rails, and to elevate her to help get her in/out of w/c, being level- she is unable to transfer from a regular bed at this time.    LOS: 30 days A FACE TO FACE EVALUATION WAS PERFORMED  Tymarion Everard 10/03/2019, 7:50 AM

## 2019-10-03 NOTE — Progress Notes (Signed)
Inpatient Rehabilitation Care Coordinator  Discharge Note  The overall goal for the admission was met for:   Discharge location: Yes. D/c to home [facility] ambulance transport. Pt to have 24/7 care from daughters.   Length of Stay: Yes. 29 days.   Discharge activity level: Yes. Max A.  Home/community participation: Yes. Limited.   Services provided included: MD, RD, PT, OT, RN, CM, TR, Pharmacy, [place]: Private Insurance: [employer]  Follow-up services arranged: Home Health: [facility] for HHPT/OT/SN(wound care)/aide and DME: [facility] for hospital bed with low airloss mattress; [facility] for specialty w/c- power chair  Comments (or additional information): contact pt dtr Olivia Mackie [phone]  Patient/Family verbalized understanding of follow-up arrangements: Yes  Individual responsible for coordination of the follow-up plan: Pt to have assistance with coordinating care needs.   Confirmed correct DME delivered: Rana Snare 10/03/2019    Rana Snare

## 2019-10-03 NOTE — Progress Notes (Signed)
Patient is scheduled for d/c via Ambulance. Belongings have packed and sent with patient. D/c instructions given by Pam Love-PA. Denies pain upon d/c Leane Para, LPN

## 2019-10-05 ENCOUNTER — Telehealth: Payer: Self-pay

## 2019-10-05 NOTE — Telephone Encounter (Signed)
Called and left message to call back. Need to know if patient is prefers phone, my chart video or webex visit.

## 2019-10-10 ENCOUNTER — Telehealth: Payer: Self-pay

## 2019-10-11 ENCOUNTER — Telehealth: Payer: Self-pay

## 2019-10-11 NOTE — Telephone Encounter (Signed)
Verbal Order:   When needed after hours & around  the clock Home Health visits for catheter care given.   TODAY at 12:20PM:   [facility] nurse given the okay for a one-time urinary catheter flush. For blood leaking around cath. Okayed by Dr. Carlis Abbott.

## 2019-10-11 NOTE — Telephone Encounter (Signed)
Please do- please add that- because if they handle a foley, they need to handle when pt having issues with it- thanks, ML

## 2019-10-11 NOTE — Telephone Encounter (Signed)
Verbal order given to [facility] for around the clock catheter care.

## 2019-10-11 NOTE — Telephone Encounter (Signed)
Haley Robinson was having some bleeding from around her catheter overnight. Her daughter call Home Health for the issue. But was told Haley Robinson orders did not include afterhours or over-night care. Is it possible to add the services?    Please advise.  Thank you

## 2019-10-12 ENCOUNTER — Encounter: Payer: Self-pay | Admitting: Physical Medicine and Rehabilitation

## 2019-10-12 ENCOUNTER — Other Ambulatory Visit: Payer: Self-pay

## 2019-10-12 ENCOUNTER — Encounter: Payer: Medicare PPO | Attending: Physical Medicine and Rehabilitation | Admitting: Physical Medicine and Rehabilitation

## 2019-10-12 ENCOUNTER — Telehealth: Payer: Self-pay | Admitting: *Deleted

## 2019-10-12 VITALS — BP 105/41 | HR 70 | Temp 97.8°F | Ht 65.0 in | Wt 133.8 lb

## 2019-10-12 DIAGNOSIS — N319 Neuromuscular dysfunction of bladder, unspecified: Secondary | ICD-10-CM

## 2019-10-12 DIAGNOSIS — K592 Neurogenic bowel, not elsewhere classified: Secondary | ICD-10-CM | POA: Insufficient documentation

## 2019-10-12 DIAGNOSIS — G8250 Quadriplegia, unspecified: Secondary | ICD-10-CM

## 2019-10-12 DIAGNOSIS — L89150 Pressure ulcer of sacral region, unstageable: Secondary | ICD-10-CM

## 2019-10-12 DIAGNOSIS — G959 Disease of spinal cord, unspecified: Secondary | ICD-10-CM

## 2019-10-12 MED ORDER — BACLOFEN 10 MG PO TABS: 5.0000 mg | ORAL_TABLET | Freq: Three times a day (TID) | ORAL | 5 refills | Status: AC

## 2019-10-12 NOTE — Progress Notes (Signed)
Subjective:    Patient ID: Haley Robinson, female    DOB: 1940-05-20, 79 y.o.   MRN: [id number]  HPI   Due to national recommendations of social distancing because of COVID 63, an audio/video tele-health visit is felt to be the most appropriate encounter for this patient at this time. See MyChart message from today for the patient's consent to a tele-health encounter with [facility]. This is a follow up tele-visit via Webex. The patient is at home. MD is at office.   Pt is a 79 yr old female with quadriplegia due to [facility] compression- -incomplete with spasticity, Neurogenic Bowel and bladder- and Unstageable pressure ulcer- for f/u.   Had blood in foley- had a lot of pain in R low back last night- H/H came out and flushed foley.   If that didn't work, and might change out early.  Pain is around waist- up a little/down a little from waist on R side.    Done with Keflex for a few days.  H/H coming today.   RLE- hurts her sometimes.  Feels muscles spasms. - in RLE_  Taking meds for spasms As needed- can take up to 3x/day- only a couple of times since got home.  Seems to help spasms some.  Jerks when moved   Still doing bowel program.  Wondering when won't need anymore.   Wondering about neck brace. Has to wear until Dr Jordan Likes.   Asking about when to stop compression socks.  Pressure ulcer- H/H is following- when poops, gets on dressing- not draining as much.     Pain Inventory Average Pain 8 Pain Right Now 5 My pain is constant and aching  LOCATION OF PAIN  Back and  right side  BOWEL Number of stools per week:bowel program every PM so q hs Oral laxative use causing to be too loose Type of laxative miralax  Enema or suppository use yes has bowel program nightly History of colostomy No  Incontinent No   BLADDER Foley  currently having bloody urine, HH came out yesterday and flushed but it is still bloody. Having increased back pain In  and out cath, frequency na Able to self cath na Bladder incontinence No  Frequent urination No  Leakage with coughing No  Difficulty starting stream No  Incomplete bladder emptying No    Mobility use a wheelchair needs help with transfers  Uses a lift  Function disabled: date disabled . I need assistance with the following:  dressing, bathing, toileting, meal prep, household duties and shopping  Neuro/Psych bladder control problems weakness tingling spasms  Prior Studies Any changes since last visit?  no  Physicians involved in your care Any changes since last visit?  no Primary care [facility] [place] Quint, has appt next week   Family History  Problem Relation Age of Onset  . Alzheimer's disease Mother   . Lung cancer Father    Social History   Socioeconomic History  . Marital status: Widowed    Spouse name: Not on file  . Number of children: 2  . Years of education: 21  . Highest education level: Not on file  Occupational History    Comment: retired IT sales professional, clerical work  Tobacco Use  . Smoking status: Current Every Day Smoker    Packs/day: 0.50    Years: 58.00    Pack years: 29.00    Types: Cigarettes  . Smokeless tobacco: Never Used  Vaping Use  . Vaping  Use: Never used  Substance and Sexual Activity  . Alcohol use: Never  . Drug use: Never  . Sexual activity: Not Currently    Birth control/protection: Surgical    Comment: Hysterectomy  Other Topics Concern  . Not on file  Social History Narrative   06/28/19 Daughter lives with her   Caffeine- coffee 1 c daily   Social Determinants of Health   Financial Resource Strain:   . Difficulty of Paying Living Expenses: Not on file  Food Insecurity:   . Worried About Programme researcher, broadcasting/film/video in the Last Year: Not on file  . Ran Out of Food in the Last Year: Not on file  Transportation Needs:   . Lack of Transportation (Medical): Not on file  . Lack of Transportation (Non-Medical): Not  on file  Physical Activity:   . Days of Exercise per Week: Not on file  . Minutes of Exercise per Session: Not on file  Stress:   . Feeling of Stress : Not on file  Social Connections:   . Frequency of Communication with Friends and Family: Not on file  . Frequency of Social Gatherings with Friends and Family: Not on file  . Attends Religious Services: Not on file  . Active Member of Clubs or Organizations: Not on file  . Attends Banker Meetings: Not on file  . Marital Status: Not on file   Past Surgical History:  Procedure Laterality Date  . ABDOMINAL HYSTERECTOMY  1981  . ABDOMINAL HYSTERECTOMY    . ANTERIOR CERVICAL DECOMP/DISCECTOMY FUSION N/A 08/28/2019   Procedure: Anterior Cervical Decompression/Discectomy Fusion - Cervical four-Cervical five - Cervical five-Cervical six - Cervical six-Cervical seven;  Surgeon: Julio Sicks, MD;  Location: [facility] OR;  Service: Neurosurgery;  Laterality: N/A;  . CATARACT EXTRACTION, BILATERAL  11/2018  . COLONOSCOPY  06/2018  . TONSILLECTOMY    . WISDOM TOOTH EXTRACTION     Past Medical History:  Diagnosis Date  . Anxiety    w/ procedures  . Carotid bruit   . Hyperlipidemia   . Hypertension   . Numbness and tingling in both hands   . Osteoarthritis    hands  . Pneumonia    x 1 - over 30 yrs ago  . PONV (postoperative nausea and vomiting)    Nausea with hysterectomy surgery only, no problems with other procedures/surgeries  . Spinal stenosis   . Vertigo, benign positional    x 4 years  . Vitamin D deficiency    LMP  (LMP Unknown)   Opioid Risk Score:   Fall Risk Score:  `1  Depression screen PHQ 2/9  Depression screen PHQ 2/9 10/12/2019  Decreased Interest 0  Down, Depressed, Hopeless 0  PHQ - 2 Score 0    Review of Systems  Constitutional: Positive for appetite change.       Poor appetite  HENT: Negative.   Eyes: Negative.   Respiratory: Negative.   Cardiovascular: Negative.   Gastrointestinal:       Bowel  program  Endocrine: Negative.   Genitourinary:       Foley with bloody urine  Musculoskeletal: Positive for back pain.       Right flank  Skin: Positive for wound.       Coccyx HH dressing changes  Allergic/Immunologic: Negative.   Neurological: Positive for weakness.       Tingling  Hematological: Bruises/bleeds easily.       Eliquis  Psychiatric/Behavioral: Negative.   All other systems reviewed and  are negative.      Objective:   Physical Exam  Webex.       Assessment & Plan:    1. Change catheter- also check U/A and Urine Cx- sent to me and Henreitta Leber- her PCP/NP.  We discussed colonization     2. Baclofen 5 mg 3x/day- scheduled- flexeril has a chance of making her sleepier if taken together.  3. Has to see Dr Jordan Likes about collar.    4. Don't inflate bed to the point hard to touch- could make pressure ulcer worse.     5. Pressure ulcer still there, less painful - stay off it unless eating or doing therapy- and recline w/c every 15-20 minutes- for 1 minutes- completely recline- have her in w/c and bed 50/50 due ot ulcer.   6. F/U in 6-8 weeks- can still do phone or webex.    I spent a total of 35 minutes on visit- as detailed above

## 2019-10-12 NOTE — Telephone Encounter (Signed)
Call placed to [facility] and verbal order given to have SN go out and do a UA/C&S and change foley. Reports are to be faxed to Dr Berline Chough and PCP Henreitta Leber.

## 2019-10-12 NOTE — Patient Instructions (Signed)
1. Change catheter- also check U/A and Urine Cx- sent to me and Henreitta Leber- her PCP/NP.  We discussed colonization     2. Baclofen 5 mg 3x/day- scheduled- flexeril has a chance of making her sleepier if taken together.  3. Has to see Dr Jordan Likes about collar.    4. Don't inflate bed to the point hard to touch- could make pressure ulcer worse.     5. Pressure ulcer still there, less painful - stay off it unless eating or doing therapy- and recline w/c every 15-20 minutes- for 1 minutes- completely recline- have her in w/c and bed 50/50 due ot ulcer.   6. F/U in 6-8 weeks- can still do phone or webex.   7. Do bowel program UNTILl pt can hold stool when has urge.

## 2019-10-15 ENCOUNTER — Telehealth: Payer: Self-pay

## 2019-10-15 ENCOUNTER — Telehealth: Payer: Self-pay | Admitting: Physical Medicine and Rehabilitation

## 2019-10-15 MED ORDER — FLUCONAZOLE 100 MG PO TABS: ORAL_TABLET | ORAL | 0 refills | Status: AC

## 2019-10-15 NOTE — Telephone Encounter (Signed)
Got U/A results back- has (+) large leukocytes and yeast- pt was not ill, so I'm inclined, due to colonization, to NOT treat, since pts always have bacteria in their urine and to only treat if she gets sick. However I will send in some diflucan for her Yeast 200 mg x1 then 100 mg daily x 6 days.

## 2019-10-15 NOTE — Telephone Encounter (Signed)
Haley Robinson CALLED W ADVERSE DECISION ABOUT GRP 3 POWER WHEELCHAIR HER NUMBER [phone]--- REF # [id number]--- IF YOU CAN PEER TO PEER WITH HER TODAY ONLY --- OR CAN FAX MORE SUPPORTING DOCUMENTS TO [phone] - DENIAL WILL STAND TOMORROW

## 2019-10-15 NOTE — Telephone Encounter (Signed)
Left a message AND sent a fax detailing that I called her TODAY at 4:06 pm- and left detailed message how to get ahold of me- [phone]- also pt's information. Will try again before leaving.  Also, concerned because they said if I didn't call her back today "the denial stands". However they did not return my call so far, as of 4:33 pm- left another message and let them know they CAN call me tomorrow- to discuss pt care. After 9am, since I'm on vacation.

## 2019-10-18 ENCOUNTER — Telehealth: Payer: Self-pay

## 2019-10-18 NOTE — Telephone Encounter (Signed)
Please let daughter know to discontinue medication in this case

## 2019-10-18 NOTE — Telephone Encounter (Signed)
Patient was recently put on Baclofen by Dr. Berline Chough and daughter calls stating patient is very sleepy. She can't get her to stay awake.

## 2019-10-18 NOTE — Telephone Encounter (Signed)
Haley Robinson has been notified and advised for patient alternate heat and ice. Let Haley Robinson know I will send Dr. Berline Chough a message to see if she recommends another medication for patient and we will contact her next week when Dr. Berline Chough returns to the office.  Dr. Berline Chough do you advise any other medication for patient.

## 2019-10-22 NOTE — Telephone Encounter (Signed)
Was approved

## 2019-10-23 MED ORDER — TIZANIDINE HCL 4 MG PO TABS: 2.0000 mg | ORAL_TABLET | Freq: Two times a day (BID) | ORAL | 3 refills | Status: AC | PRN

## 2019-10-23 NOTE — Telephone Encounter (Signed)
Baclofen knocked her out- couldn't wake her up at all with 1 dose of Baclofen!   Did have a lot of leg pain.  Gave her some Flexeril when tylenol wouldn't work well.   1 tab 1x/day- nothing took pain away completely.   Went over Dantrolene and Zanaflex/Tizanidine.   Explained both have risk of sedation, however, Zanaflex is as needed- and  Doesn't usually "knock people out"- Dantrolene is scheduled and have to follow labs on it- and since she cannot come in yet, that concerns me.   We decided on Tizanidine 2 mg 2x/day as needed.  Will write rx for 2-4 mg 2x/day as needed for spasms.   Informed Daughter Haley Robinson to call me if this causes side effects and if does, will try Dantrolene.

## 2019-11-02 ENCOUNTER — Telehealth: Payer: Self-pay | Admitting: *Deleted

## 2019-11-02 NOTE — Telephone Encounter (Signed)
Colon Flattery, Mrs Bass's daughter called to speak with Dr Berline Chough about insurance denying the hospital bed. She also is reporting that [name] is having pain in her legs at night but not during the day.  Unsure why this is happening. Her call back number is [phone].

## 2019-11-05 NOTE — Telephone Encounter (Signed)
Will let us know who they got hospital bed from- supposedly insurance company called and said bed wasn't covered. Which makes NO sense, since she's a SCI patient and has pressure ulcer.   They likely need a peer to peer to get it covered.   Next, pain at night/spasms a little better with addition of Gabapentin from PCP- but last 2 days or so, had a lot of foot and ankle swelling- which I explained can be from Gabapentin- it's a risk/benefit decision if they want to continue the Gabapentin- will let them decide.   We should still be titrating up Dantrolene- which should help spasms- spasms have a circadian rhythm and reason why it's worse at night.   Let me know where got bed from, and will look into more.

## 2019-11-08 ENCOUNTER — Telehealth: Payer: Self-pay | Admitting: *Deleted

## 2019-11-08 NOTE — Telephone Encounter (Signed)
[name] called back and says that Haley Robinson right leg is still swollen and left is not so doubts it is the gabapentin.  Still having a "ton of pain". She has faxed the form today to our office, that she spoke about with Dr Berline Chough.  She would like to speak with Dr Berline Chough again.

## 2019-11-09 NOTE — Telephone Encounter (Signed)
Poke to [place]- swelling is better today after soaking in Epson salts, so they have decided to con't the gabapentin for pain for now.  Also needs documentation from h/H nurse about wound on backside to be able to prove to insurance they need to pay for it. So we can go against the denial.  [name] understands and will get H/H nursing to see her. And let me know.

## 2019-11-21 ENCOUNTER — Telehealth: Payer: Self-pay

## 2019-11-21 NOTE — Telephone Encounter (Signed)
[name] (daughte) called:   The hospital bed was not approved.Per [name] the order needs more documentation for approval. She wanted to make sure you got the denial. And will re-submit the order.   [name] advised to request the denial to be re-faxed for review.   [name] also wants to see if you can put in an order for a Therapy Bar rental?   Mrs. Paolillo  has increasing leg pain. I advised [name] to call her PCP for the pain. It could be something that need to be taken care of urgently.    (Tray is aware you are out of the office this week.  And a note will be left for all concerns).  Thanks.

## 2019-11-26 ENCOUNTER — Telehealth: Payer: Self-pay | Admitting: *Deleted

## 2019-11-26 MED ORDER — HYDROCODONE-ACETAMINOPHEN 5-325 MG PO TABS
1.0000 | ORAL_TABLET | ORAL | 0 refills | Status: DC | PRN
Start: 2019-11-26 — End: 2019-11-30

## 2019-11-26 NOTE — Telephone Encounter (Signed)
Talked to Daughter I'm concerned about ischemia- but daughter says no color or temperature change.   I suggested we can do Norco 5/325 mg q4 hours prn- however could cover up pain which concerns me- to see PCP Wednesday.   Agreed to write Norco for 7 days until gets seen by PCP and will go from there, since don't know what's causing such severe leg pain.

## 2019-11-26 NOTE — Telephone Encounter (Signed)
Haley Robinson has called back about her mothers leg pain.  She is concerned something else is going on. She was awake until 5 am last night with pain and cannot stand for right leg to be touched.  She would like to speak with Dr Berline Chough about her concerns. Her mobile # is [phone].

## 2019-11-30 ENCOUNTER — Encounter: Payer: Self-pay | Admitting: Physical Medicine and Rehabilitation

## 2019-11-30 ENCOUNTER — Encounter: Payer: Medicare PPO | Attending: Physical Medicine and Rehabilitation | Admitting: Physical Medicine and Rehabilitation

## 2019-11-30 ENCOUNTER — Other Ambulatory Visit: Payer: Self-pay

## 2019-11-30 VITALS — BP 133/71 | HR 68 | Ht 65.0 in | Wt 133.0 lb

## 2019-11-30 DIAGNOSIS — K592 Neurogenic bowel, not elsewhere classified: Secondary | ICD-10-CM | POA: Insufficient documentation

## 2019-11-30 DIAGNOSIS — G959 Disease of spinal cord, unspecified: Secondary | ICD-10-CM | POA: Insufficient documentation

## 2019-11-30 DIAGNOSIS — N319 Neuromuscular dysfunction of bladder, unspecified: Secondary | ICD-10-CM | POA: Diagnosis not present

## 2019-11-30 DIAGNOSIS — G8250 Quadriplegia, unspecified: Secondary | ICD-10-CM | POA: Diagnosis present

## 2019-11-30 DIAGNOSIS — L89150 Pressure ulcer of sacral region, unstageable: Secondary | ICD-10-CM | POA: Diagnosis present

## 2019-11-30 MED ORDER — DANTROLENE SODIUM 50 MG PO CAPS: 50.0000 mg | ORAL_CAPSULE | Freq: Every day | ORAL | 5 refills | Status: AC

## 2019-11-30 MED ORDER — HYDROCODONE-ACETAMINOPHEN 5-325 MG PO TABS
1.0000 | ORAL_TABLET | Freq: Two times a day (BID) | ORAL | 0 refills | Status: AC | PRN
Start: 2019-11-30 — End: ?

## 2019-11-30 MED ORDER — DULOXETINE HCL 30 MG PO CPEP: 30.0000 mg | ORAL_CAPSULE | Freq: Every day | ORAL | 5 refills | Status: DC

## 2019-11-30 NOTE — Patient Instructions (Signed)
1. Dantrolene-  50 mg nightly x 1 week, then 50 mg 2x/day x 1 week, then 50 mg 3x/day.   2. Can continue Baclofen for symptom relief until Dantrolene kicks in.   3. Con't  Norco/Hydrocodone- - write new Rx BID prn #60  4. Duloxetine/Cymbalta 30 mg nightly- can try in AM if doesn't sleep well- x 1 week, then 60 mg daily- day or night.  1% of patients will have nausea- only lasts 7 days.  5. Can reduce gabapentin over time- as Duloxetine kicks in.   6. Come off Amiptriptyline first- x 1 week, then can make trazodone as needed.   7. W/C was declined- AFTER was written.  She's meets EVERY criteria for power w/c- in a QUADRIPLEGIC. She cannot do pressure relief (and has pressure ulcer) she cannot push a manual w/c- her arms aren't strong enough. Needs for transfers and mobilization.     8. Got letter about bed being declined- needs to get picture to justify pressure ulcer/air mattress.    9. Suggest Dr Lorin Picket  MacDiarmid-he's the only Neuro-Urologist.  Sediment is due to SCI.   10. If can hold it, that's the sign she' doesn't need Bowel program anymore.    11. Went over sediment from - osteoporosis- and losing calcium form bone.   12. F/U in 6 weeks.

## 2019-11-30 NOTE — Progress Notes (Signed)
Subjective:    Patient ID: Haley Robinson, female    DOB: [birth year], 79 y.o.   MRN: [id number]  HPI  Pt is a 79 yr old female with quadriplegia due to [facility] compression- -incomplete with spasticity, Neurogenic Bowel and bladder- and Unstageable pressure ulcer- for f/u.   Has been having severe RLE pain for past few weeks- getting worse and worse.   Pain meds have worked greatDealer- gives her at 7pm at night and then again at 11pm Doesn't hurt much during the day- has helped her sleep a lot.    Pain has so bad at night- didn't have energy to do exercises.  Hasn't been able to do therapy because of the pain.     Is stinging and burning now, but at night, it's REALLY painful.  At night- sometimes it's burning and stinging - doesn't last as long or as bad- and sometimes, it's throbbing which is real bad- maybe a knife stabbing pain occ.    Cannot take much baclofen - only 5 mg ~ 6pm- knocks her out, but helps muscle spasms some.   Zanaflex wasn't helpful at all.   In bed at night ONLY- rest of time in w/c.    Has been on gabapentin- 300 mg- takes ~ 6x/day.  Sometimes thinks it helps, but sometimes not.   Has UTI currently- but also not as clear as has been.  Slowed processing and slowed memory- Thinks it's gabapentin   PCP send her to Urology. Having ot keep changing it.     Still has pressure ulcer-  Is still bad, but SLIGHTLY better.    Asking about parallel bars to  rent to work on standing, walking.     Pain Inventory Average Pain 7 Pain Right Now 0 My pain is burning and stabbing  LOCATION OF PAIN  leg  BOWEL Number of stools per week: 6 Oral laxative use Yes  Type of laxative doculax Enema or suppository use No  History of colostomy No  Incontinent No   BLADDER Foley In and out cath, frequency .  Able to self cath No  Bladder incontinence No  Frequent urination No  Leakage with coughing No  Difficulty starting stream No  Incomplete bladder  emptying No    Mobility use a wheelchair needs help with transfers Power Chair  Function retired  Neuro/Psych tingling spasms  Prior Studies Any changes since last visit?  no  Physicians involved in your care Any changes since last visit?  no   Family History  Problem Relation Age of Onset  . Alzheimer's disease Mother   . Lung cancer Father    Social History   Socioeconomic History  . Marital status: Widowed    Spouse name: Not on file  . Number of children: 2  . Years of education: 96  . Highest education level: Not on file  Occupational History    Comment: retired IT sales professional, clerical work  Tobacco Use  . Smoking status: Current Every Day Smoker    Packs/day: 0.50    Years: 58.00    Pack years: 29.00    Types: Cigarettes  . Smokeless tobacco: Never Used  Vaping Use  . Vaping Use: Never used  Substance and Sexual Activity  . Alcohol use: Never  . Drug use: Never  . Sexual activity: Not Currently    Birth control/protection: Surgical    Comment: Hysterectomy  Other Topics Concern  . Not on file  Social History Narrative  06/28/19 Daughter lives with her   Caffeine- coffee 1 c daily   Social Determinants of Health   Financial Resource Strain:   . Difficulty of Paying Living Expenses: Not on file  Food Insecurity:   . Worried About Programme researcher, broadcasting/film/video in the Last Year: Not on file  . Ran Out of Food in the Last Year: Not on file  Transportation Needs:   . Lack of Transportation (Medical): Not on file  . Lack of Transportation (Non-Medical): Not on file  Physical Activity:   . Days of Exercise per Week: Not on file  . Minutes of Exercise per Session: Not on file  Stress:   . Feeling of Stress : Not on file  Social Connections:   . Frequency of Communication with Friends and Family: Not on file  . Frequency of Social Gatherings with Friends and Family: Not on file  . Attends Religious Services: Not on file  . Active Member of Clubs or  Organizations: Not on file  . Attends Banker Meetings: Not on file  . Marital Status: Not on file   Past Surgical History:  Procedure Laterality Date  . ABDOMINAL HYSTERECTOMY  1981  . ABDOMINAL HYSTERECTOMY    . ANTERIOR CERVICAL DECOMP/DISCECTOMY FUSION N/A 08/28/2019   Procedure: Anterior Cervical Decompression/Discectomy Fusion - Cervical four-Cervical five - Cervical five-Cervical six - Cervical six-Cervical seven;  Surgeon: Julio Sicks, MD;  Location: [facility] OR;  Service: Neurosurgery;  Laterality: N/A;  . CATARACT EXTRACTION, BILATERAL  11/2018  . COLONOSCOPY  06/2018  . TONSILLECTOMY    . WISDOM TOOTH EXTRACTION     Past Medical History:  Diagnosis Date  . Anxiety    w/ procedures  . Carotid bruit   . Hyperlipidemia   . Hypertension   . Numbness and tingling in both hands   . Osteoarthritis    hands  . Pneumonia    x 1 - over 30 yrs ago  . PONV (postoperative nausea and vomiting)    Nausea with hysterectomy surgery only, no problems with other procedures/surgeries  . Spinal stenosis   . Vertigo, benign positional    x 4 years  . Vitamin D deficiency    BP 133/71   Pulse 68   Ht 5\' 5"  (1.651 m)   Wt 133 lb (60.3 kg)   LMP  (LMP Unknown)   SpO2 91%   BMI 22.13 kg/m   Opioid Risk Score:   Fall Risk Score:  `1  Depression screen PHQ 2/9  Depression screen PHQ 2/9 10/12/2019  Decreased Interest 0  Down, Depressed, Hopeless 0  PHQ - 2 Score 0    Review of Systems  Constitutional: Positive for appetite change.  HENT: Negative.   Eyes: Negative.   Respiratory: Negative.   Cardiovascular: Positive for leg swelling.  Gastrointestinal:       Spasms  Endocrine: Negative.   Genitourinary: Negative.   Musculoskeletal: Negative.   Skin: Negative.   Allergic/Immunologic: Negative.   Neurological:       Tingling   Hematological: Negative.   Psychiatric/Behavioral: Negative.        Objective:   Physical Exam Awake, slowed responses and not  snappy self, accompanied by daughter, in power w/c, NAD Hoffman's R>L and B/L Few beats clonus in LEs B/L MAS of 2 in LEs with a few spasms seen.        Assessment & Plan:   1. Dantrolene-  50 mg nightly x 1 week, then 50 mg 2x/day  x 1 week, then 50 mg 3x/day.   2. Can continue Baclofen for symptom relief until Dantrolene kicks in.   3. Con't  Norco/Hydrocodone- - write new Rx BID prn #60  4. Duloxetine/Cymbalta 30 mg nightly- can try in AM if doesn't sleep well- x 1 week, then 60 mg daily- day or night.  1% of patients will have nausea- only lasts 7 days.  5. Can reduce gabapentin over time- as Duloxetine kicks in.   6. Come off Amiptriptyline first- x 1 week, then can make trazodone as needed.   7. W/C was declined- AFTER was written.  She's meets EVERY criteria for power w/c- in a QUADRIPLEGIC. She cannot do pressure relief (and has pressure ulcer) she cannot push a manual w/c- her arms aren't strong enough. Needs for transfers and mobilization.      8. Got letter about bed being declined- needs to get picture to justify pressure ulcer/air mattress.    9. Suggest Dr Lorin Picket  MacDiarmid-he's the only Neuro-Urologist.  Sediment is due to SCI.   10. If can hold it, that's the sign she' doesn't need Bowel program anymore.    11. Went over sediment from - osteoporosis- and losing calcium form bone.   12. F/U in 6 weeks.    I spent a total of 45 minutes on visit- as detailed above.

## 2019-12-12 ENCOUNTER — Telehealth: Payer: Self-pay | Admitting: *Deleted

## 2019-12-12 NOTE — Telephone Encounter (Signed)
Colon Flattery, daughter of Tashana Haberl left a message asking if Dr. Berline Chough was able to see the uploaded photos of patients pressure wound. It is in reference to a wheel chair denial.  Photo was supposedly uploaded by Claris Gladden, FNP, [facility]

## 2019-12-13 ENCOUNTER — Telehealth: Payer: Self-pay | Admitting: *Deleted

## 2019-12-13 NOTE — Telephone Encounter (Signed)
Haley Robinson's daughter, Haley Robinson, called and siad her mothers pain is getting worse and oxycodone does not help at all. She did not sleep at all last night. Haley Robinson is requesting Dr Berline Chough please call her about options.

## 2019-12-14 NOTE — Telephone Encounter (Signed)
L/m on daughters voicemail 4:12 pm 12/14/19

## 2019-12-17 ENCOUNTER — Telehealth: Payer: Self-pay

## 2019-12-17 NOTE — Telephone Encounter (Signed)
Haley Robinson said she missed your phone call. Her call back phone number is [phone].   She wanted to know if you received the pictures of [name]'s wound? Also the Air mattress or bed has been denied again. What can they do now?

## 2019-12-18 ENCOUNTER — Telehealth: Payer: Self-pay

## 2019-12-18 MED ORDER — OXYCODONE HCL 5 MG PO TABS: 5.0000 mg | ORAL_TABLET | Freq: Four times a day (QID) | ORAL | 0 refills | Status: AC | PRN

## 2019-12-18 NOTE — Telephone Encounter (Signed)
I have  NOT been able to see any phot of her pressure ulcer- at all.  I searched the PCP's note, just FYI.  I will send in Some additional pain meds for pt.  I looked into Candice Clark's note, and found no pictures, so far.    If they can't get me photos, I at least need the stage and dimensions.  However, this MIGHT not be why insurance Is denying the bed- I guess before we assume what the insurance company wants, she needs to call them to figure out WHY denying air mattress.     I attempted to call daughter again and it just rang and voicemail wouldn't pick up.    Make sure she knows I sent in OXYcodone (not hydrocodone) for pt- it's 2-3x as strong as hydrocodone- for days she hurts a lot worse, NOT for EVERY DAY!Marland Kitchen Has hydrocodone currently, didn't have oxycodone at home..    I have called W/C vendor- they are clear, they just need to jump through hoops to make sure it's covered- it's not a concern.   Bed/air mattress- is the insurance issue-  Not our documentation- per our d/w our Social worker- It's not what we've documented, it's the insurance company.  Please call them and see what's "missing' because we would have never been able to GET an air matress without documenting the pressure ulcer clearly.

## 2019-12-18 NOTE — Telephone Encounter (Signed)
[name] (daugter) called:  Haley Robinson was only able to get  part of her MRI. Per [name] she will have to return to get images of the lower or spinal area. [name] has requested the lower/spinal MRI to be done with an IV. Because of [name]' cognitive function. She wants to know if you could order it?  Please advise. Call back ph [phone].

## 2019-12-19 ENCOUNTER — Telehealth: Payer: Self-pay

## 2019-12-19 NOTE — Telephone Encounter (Signed)
I called Haley Robinson and left a message for a call back.  I am not able to leave a detailed voicemail, no ID.   Per Dr. Berline Chough: Haley Robinson should call Haley Robinson company about the [employer]. Also having  the wound images or wound measurements may benefit her case.  But its up to [employer].  (Dr. Berline Chough did not get the images  and the request was already denied).   As for the MRI contact the provider that placed the original order. For change in location or imaging needs.

## 2019-12-19 NOTE — Telephone Encounter (Signed)
Haley Robinson is aware of Dr. Dahlia Client replies. She has been advised.

## 2019-12-21 NOTE — Telephone Encounter (Signed)
SW followed up with pt dtr Haley Robinson to discuss concerns reported to Dr. Berline Chough about her hospital bed being denied.  SW encouraged her to call insurance company and appeal DME decision. SW also informed if there was a fax number and additional documentation was required from when hospital bed was initial recommended, this SW will send documentation. She was also encouraged to follow-up with her PCP as well as they may be able to send documentation. Ultimately she has been encouraged follow-up with insurance company.

## 2019-12-23 ENCOUNTER — Other Ambulatory Visit: Payer: Self-pay | Admitting: Physical Medicine and Rehabilitation

## 2020-01-07 ENCOUNTER — Encounter: Payer: Medicare PPO | Admitting: Physical Medicine and Rehabilitation

## 2020-02-18 ENCOUNTER — Ambulatory Visit: Payer: Medicare PPO | Admitting: Physical Medicine and Rehabilitation

## 2021-11-13 IMAGING — RF DG CERVICAL SPINE 2 OR 3 VIEWS
1 series · 1 of 1 positions shown · non-contrast
Comparison: Cervical spine MRI 07/16/2019

CLINICAL DATA: Provided history: Elective surgery. Additional
provided: C4-7 ACDF, fluoroscopy time 5 seconds, 0.53 mGy

EXAM:
CERVICAL SPINE - 2-3 VIEW; DG C-ARM 1-60 MIN

[Series 1: run · 1 of 1 slices shown]
[im 1/1]
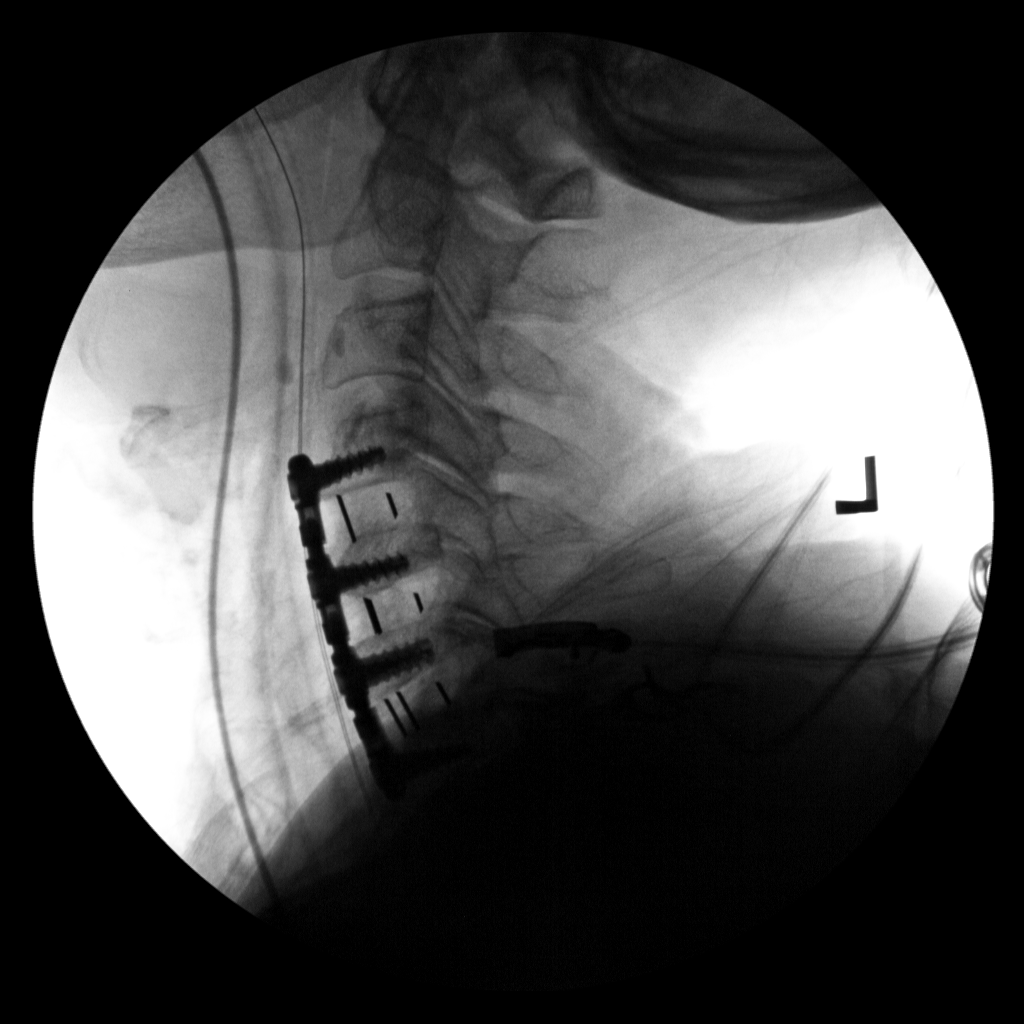

[1 of 1 positions shown; findings below may reference images not displayed]

FINDINGS: A single lateral view intraoperative fluoroscopic image of the
cervical spine is submitted for evaluation. Partially visualized
support tubes. ACDF hardware an interbody spacers are present at the
C4-C7 levels. No unexpected finding.
IMPRESSION: Single intraoperative fluoroscopic image of the cervical spine from
reported C4-C7 ACDF as described. No unexpected finding.

## 2021-11-13 IMAGING — MR MR CERVICAL SPINE W/O CM
5 series · 43 of 48 positions shown · non-contrast
Comparison: MRI of the cervical spine 07/16/2019.

CLINICAL DATA: Status post ACDF.

EXAM:
MRI CERVICAL SPINE WITHOUT CONTRAST
TECHNIQUE: Multiplanar, multisequence MR imaging of the cervical spine was
performed. No intravenous contrast was administered.

[Series 4: sag ir · sagittal · 3.0mm · 0.43mm/px · 8 of 16 slices shown]
[im 1/16]
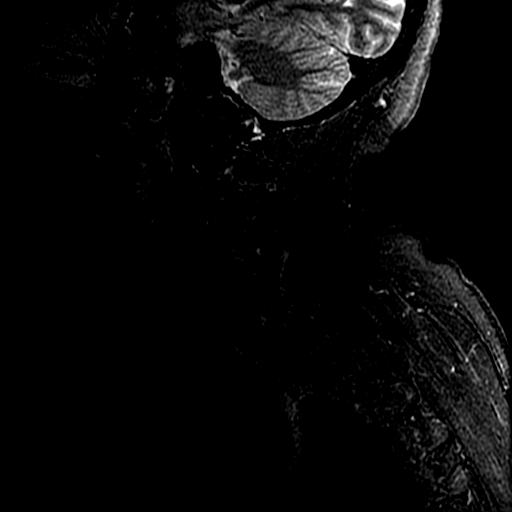
[im 3/16]
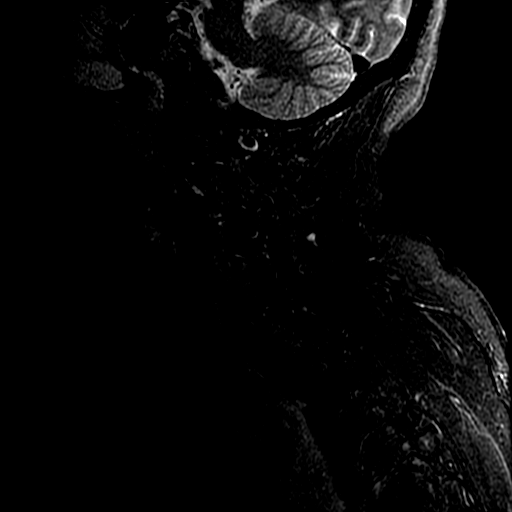
[im 5/16]
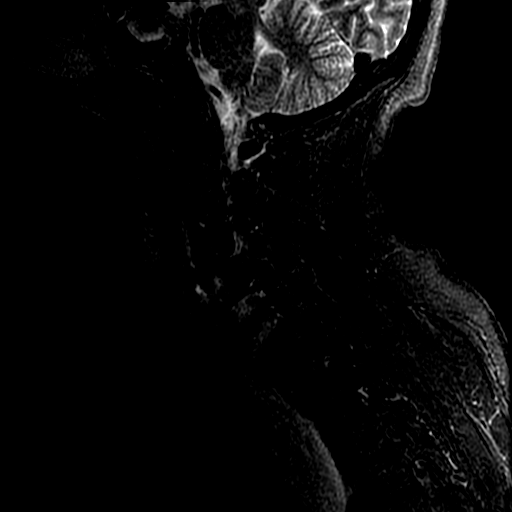
[im 7/16]
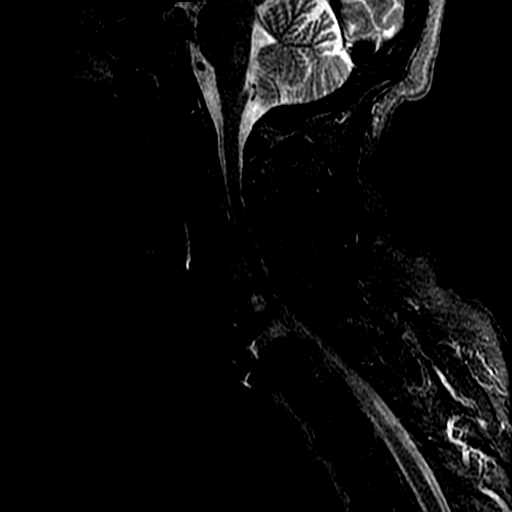
[im 9/16]
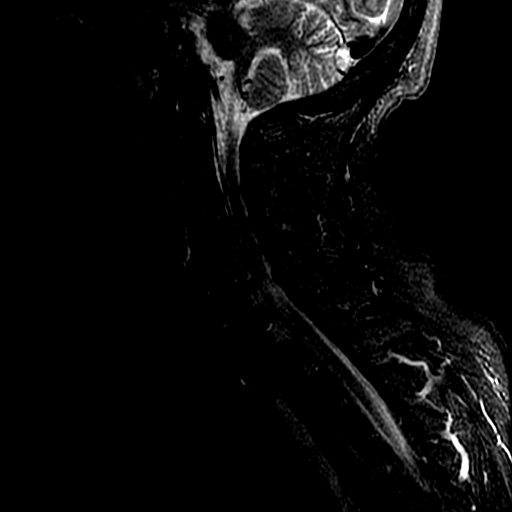
[im 11/16]
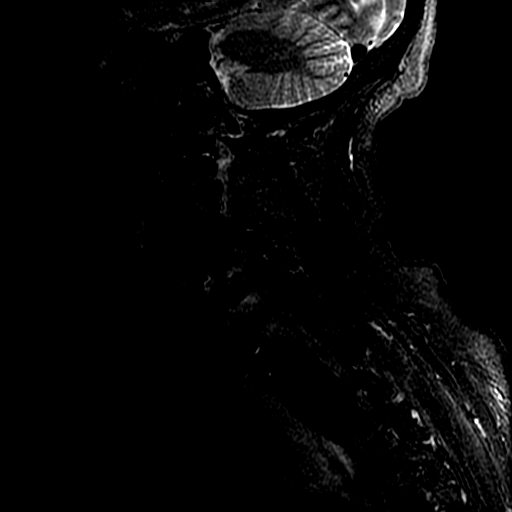
[im 13/16]
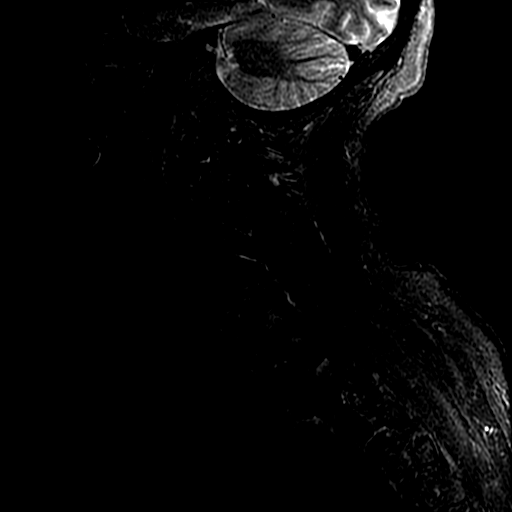
[im 16/16]
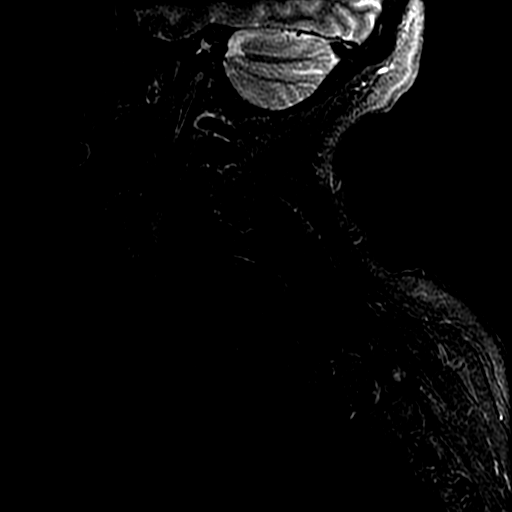

[Series 5: T1 · sagittal · 3.0mm · 0.86mm/px · 7 of 16 slices shown]
[im 1/16]
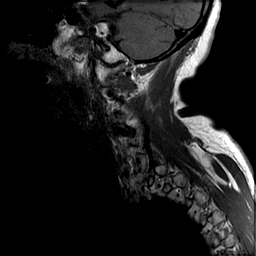
[im 3/16]
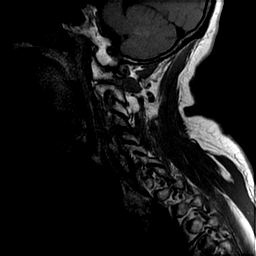
[im 6/16]
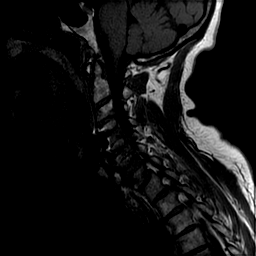
[im 8/16]
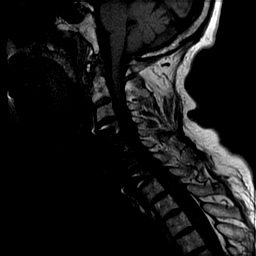
[im 11/16]
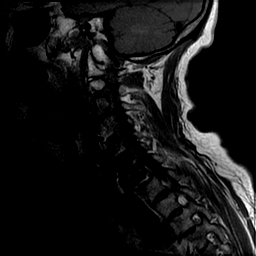
[im 13/16]
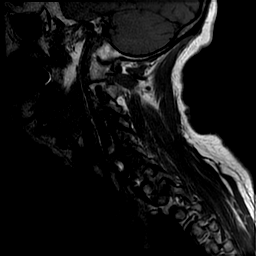
[im 16/16]
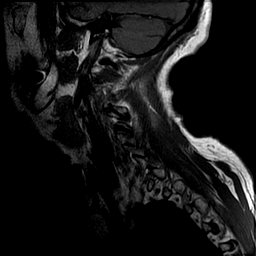

[Series 6: T2 · sagittal · 3.0mm · 0.86mm/px · 7 of 16 slices shown (1 of 2)]
[im 1/16]
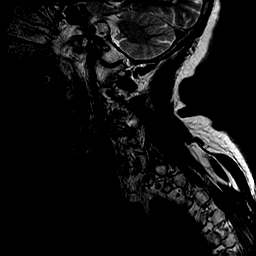
[im 3/16]
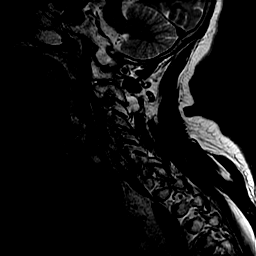
[im 6/16]
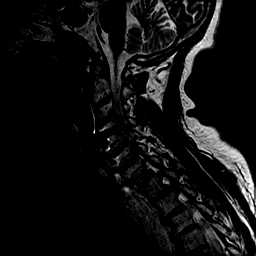
[im 8/16]
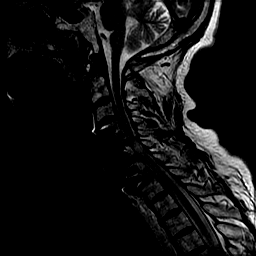
[im 11/16]
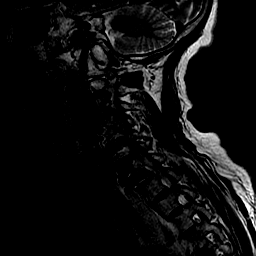
[im 13/16]
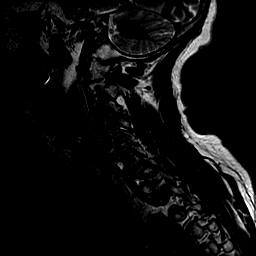
[im 16/16]
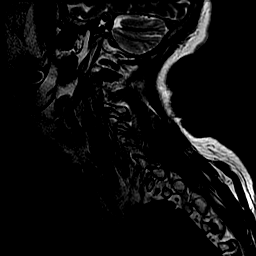

[Series 7: ax 2d merge · axial · 3.0mm · 0.78mm/px · z∈[-73,+13]mm · 8 of 29 slices shown]
[im 1/29]
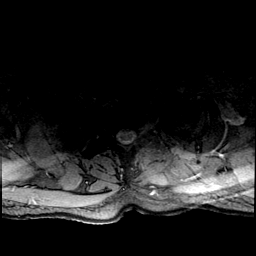
[im 5/29]
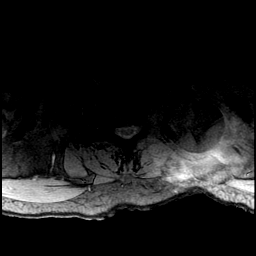
[im 10/29]
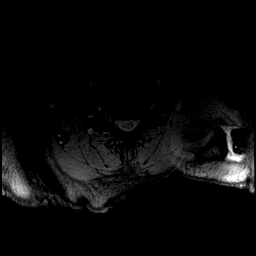
[im 12/29]
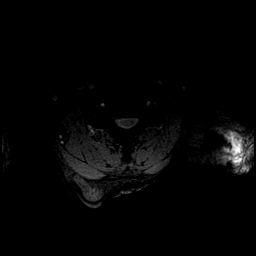
[im 17/29]
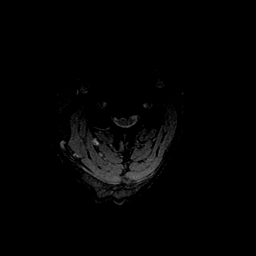
[im 19/29]
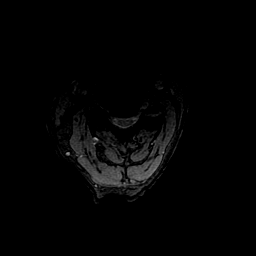
[im 24/29]
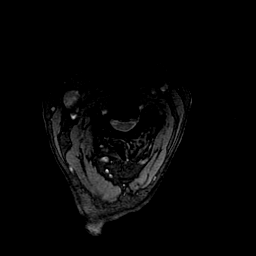
[im 29/29]
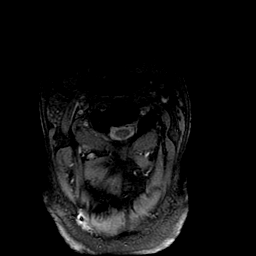

[Series 8: T2 · axial · 3.0mm · 0.86mm/px · z∈[-79,+8]mm · 13 of 29 slices shown (2 of 2)]
[im 1/29]
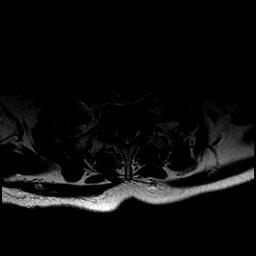
[im 3/29]
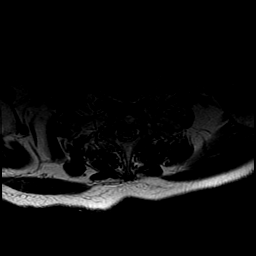
[im 5/29]
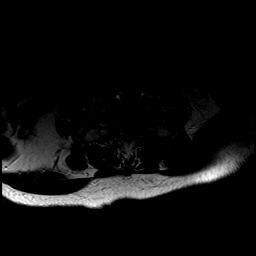
[im 8/29]
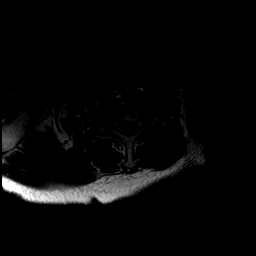
[im 10/29]
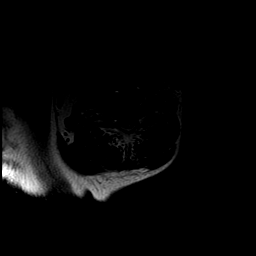
[im 12/29]
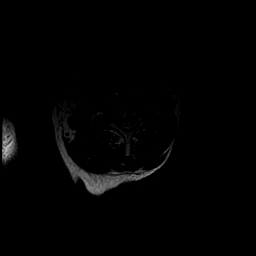
[im 15/29]
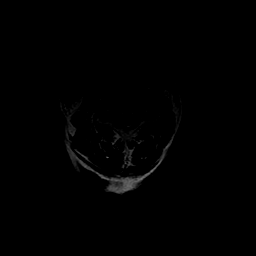
[im 17/29]
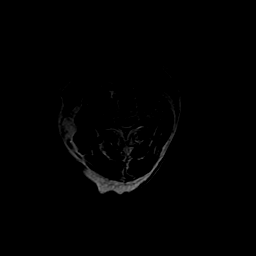
[im 19/29]
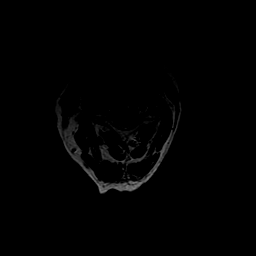
[im 22/29]
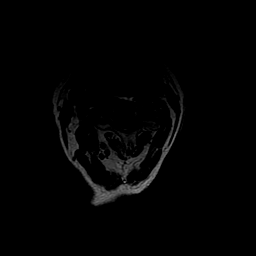
[im 24/29]
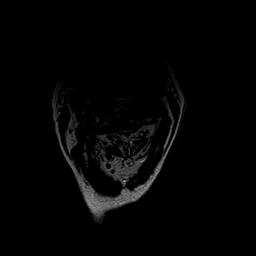
[im 26/29]
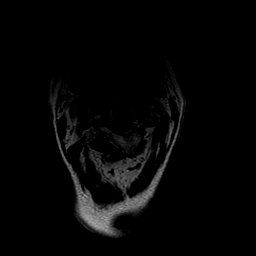
[im 29/29]
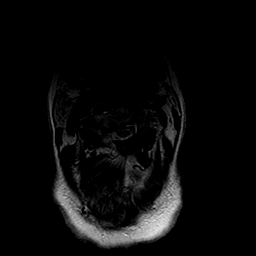

[43 of 48 positions shown; findings below may reference images not displayed]

FINDINGS: Alignment: Anatomic.  No significant listhesis.

Vertebrae: Marrow is obscured by fusion hardware at C4-5, C5-6, and
C6-7.

Cord: Focal T2 hyperintensity in the cord at C5 and C5-6 spanning
1.4 cm stable. No new cord signal abnormality is present. Cord is
decompressed at these levels.

Posterior Fossa, vertebral arteries, paraspinal tissues:
Craniocervical junction is normal. Flow is present in the vertebral
arteries bilaterally. Visualized intracranial contents are normal.

Disc levels:

C2-3: Uncovertebral spurring contributes to stable mild foraminal
narrowing bilaterally.

C3-4: A broad-based disc osteophyte complex present. Is slight
distortion of the ventral surface the cord with partial effacement
of ventral CSF. Moderate foraminal stenosis is stable, left greater
than right.

C4-5: Central canal is decompressed following anterior fusion.
Moderate foraminal narrowing is improved bilaterally as well.
Foraminal stenosis is worse right than left.

C5-6: Central canal is decompressed following anterior fusion and
discectomy. Foramina are patent bilaterally.

C6-7: Central canal is decompressed following discectomy. Mild
residual foraminal narrowing is present bilaterally, markedly
improved.

C7-T1: Mild disc bulging is present. No significant stenosis is
present.
IMPRESSION: 1. Interval anterior fusion and discectomy at C4-5, C5-6, and C6-7.
2. Decompression of the central canal at these levels.
3. T2 signal changes at C5 and C5-6 are stable, suggesting
myelomalacia versus residual edema. Morphology is preserved. No new
cord signal changes are present.
4. Mild residual foraminal narrowing bilaterally at C6-7 is markedly
improved.
5. Stable moderate foraminal stenosis bilaterally at C3-4 is stable,
left greater than right.
6. Stable mild foraminal narrowing bilaterally at C2-3.
7. Mild disc bulging at C7-T1 without significant stenosis.

## 2021-11-19 IMAGING — DX DG ABDOMEN 1V
1 series · 1 of 1 positions shown · non-contrast
Comparison: None.

CLINICAL DATA: Patient states that he has not been to the restroom
in 1 week.

EXAM:
ABDOMEN - 1 VIEW

[abdomen]
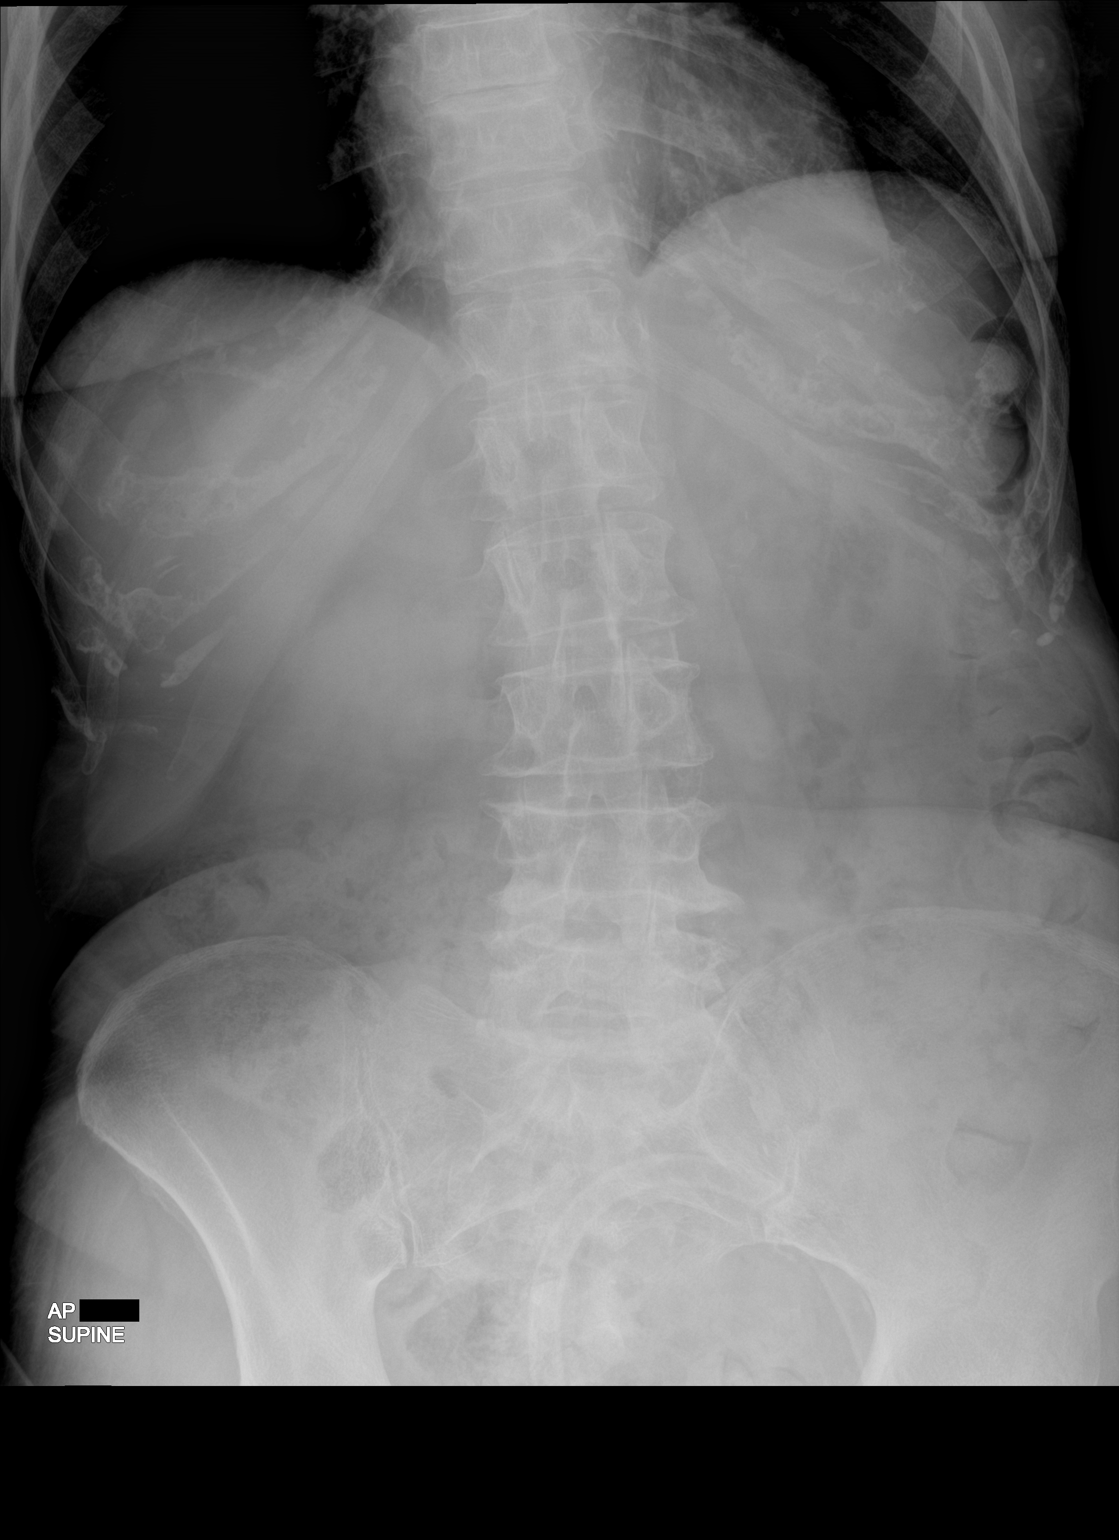

[1 of 1 positions shown; findings below may reference images not displayed]

FINDINGS: The bowel gas pattern is normal. A large amount of stool is seen
throughout the large bowel. No radio-opaque calculi or other
significant radiographic abnormality are seen.
IMPRESSION: 1. Large stool burden without evidence of bowel obstruction.
